# Patient Record
Sex: Female | Born: 1955
Health system: Southern US, Community
[De-identification: ages and names within clinical notes are randomized; demographics above are authoritative.]

## PROBLEM LIST (undated history)

## (undated) ENCOUNTER — Ambulatory Visit: Admission: EM

## (undated) DIAGNOSIS — I10 Essential (primary) hypertension: Secondary | ICD-10-CM

## (undated) DIAGNOSIS — E7800 Pure hypercholesterolemia, unspecified: Secondary | ICD-10-CM

## (undated) DIAGNOSIS — F419 Anxiety disorder, unspecified: Secondary | ICD-10-CM

## (undated) DIAGNOSIS — B009 Herpesviral infection, unspecified: Secondary | ICD-10-CM

## (undated) DIAGNOSIS — Z780 Asymptomatic menopausal state: Secondary | ICD-10-CM

## (undated) DIAGNOSIS — G43909 Migraine, unspecified, not intractable, without status migrainosus: Secondary | ICD-10-CM

## (undated) DIAGNOSIS — F32A Depression, unspecified: Secondary | ICD-10-CM

## (undated) HISTORY — DX: Anxiety disorder, unspecified: F41.9

## (undated) HISTORY — PX: OTHER SURGICAL HISTORY: SHX169

## (undated) HISTORY — DX: Asymptomatic menopausal state: Z78.0

## (undated) HISTORY — DX: Essential (primary) hypertension: I10

## (undated) HISTORY — PX: REPAIR ANKLE LIGAMENT: SUR1187

## (undated) HISTORY — DX: Herpesviral infection, unspecified: B00.9

## (undated) HISTORY — PX: FRACTURE SURGERY: SHX138

## (undated) HISTORY — DX: Pure hypercholesterolemia, unspecified: E78.00

## (undated) HISTORY — PX: APPENDECTOMY: SHX54

## (undated) HISTORY — DX: Migraine, unspecified, not intractable, without status migrainosus: G43.909

## (undated) HISTORY — DX: Depression, unspecified: F32.A

## (undated) HISTORY — PX: BREAST SURGERY: SHX581

## (undated) HISTORY — PX: DIAGNOSTIC LAPAROSCOPY: SUR761

---

## 2004-08-01 ENCOUNTER — Ambulatory Visit: Payer: Self-pay | Admitting: Podiatry

## 2005-08-12 ENCOUNTER — Ambulatory Visit: Payer: Self-pay | Admitting: Family Medicine

## 2007-03-17 ENCOUNTER — Ambulatory Visit: Payer: Self-pay | Admitting: Family Medicine

## 2007-10-06 HISTORY — PX: UPPER GI ENDOSCOPY: SHX6162

## 2008-06-22 ENCOUNTER — Ambulatory Visit: Payer: Self-pay | Admitting: Obstetrics and Gynecology

## 2008-06-25 ENCOUNTER — Ambulatory Visit: Payer: Self-pay | Admitting: Family Medicine

## 2008-07-11 ENCOUNTER — Ambulatory Visit: Payer: Self-pay | Admitting: Obstetrics and Gynecology

## 2008-07-11 ENCOUNTER — Encounter: Payer: Self-pay | Admitting: Gastroenterology

## 2008-07-12 ENCOUNTER — Encounter: Payer: Self-pay | Admitting: Gastroenterology

## 2008-07-12 ENCOUNTER — Ambulatory Visit: Payer: Self-pay | Admitting: Obstetrics and Gynecology

## 2008-07-18 ENCOUNTER — Ambulatory Visit: Payer: Self-pay | Admitting: Obstetrics and Gynecology

## 2008-07-23 ENCOUNTER — Ambulatory Visit: Payer: Self-pay | Admitting: Obstetrics and Gynecology

## 2008-08-07 ENCOUNTER — Ambulatory Visit: Payer: Self-pay | Admitting: Obstetrics and Gynecology

## 2008-08-09 ENCOUNTER — Encounter: Payer: Self-pay | Admitting: Gastroenterology

## 2008-08-17 ENCOUNTER — Encounter: Payer: Self-pay | Admitting: Gastroenterology

## 2008-08-17 DIAGNOSIS — R933 Abnormal findings on diagnostic imaging of other parts of digestive tract: Secondary | ICD-10-CM | POA: Insufficient documentation

## 2008-09-03 ENCOUNTER — Ambulatory Visit: Payer: Self-pay | Admitting: Unknown Physician Specialty

## 2008-09-03 HISTORY — PX: COLONOSCOPY: SHX174

## 2008-09-03 LAB — HM COLONOSCOPY

## 2008-09-06 ENCOUNTER — Ambulatory Visit (HOSPITAL_COMMUNITY): Admission: RE | Admit: 2008-09-06 | Discharge: 2008-09-06 | Payer: Self-pay | Admitting: Gastroenterology

## 2008-09-06 ENCOUNTER — Telehealth: Payer: Self-pay | Admitting: Gastroenterology

## 2008-09-06 ENCOUNTER — Ambulatory Visit: Payer: Self-pay | Admitting: Gastroenterology

## 2009-02-14 ENCOUNTER — Ambulatory Visit: Payer: Self-pay | Admitting: Family Medicine

## 2009-08-21 ENCOUNTER — Ambulatory Visit: Payer: Self-pay

## 2009-09-18 ENCOUNTER — Emergency Department: Payer: Self-pay | Admitting: Unknown Physician Specialty

## 2009-09-19 ENCOUNTER — Ambulatory Visit: Payer: Self-pay | Admitting: Cardiology

## 2009-09-20 ENCOUNTER — Ambulatory Visit: Payer: Self-pay | Admitting: Cardiology

## 2010-03-31 IMAGING — US US PELV - US TRANSVAGINAL
1 series · 17 of 25 positions shown · non-contrast
Comparison: none

REASON FOR EXAM: Right lower pelvic pain
COMMENTS:

[Series 1: us pelv - us transvaginal · 17 of 45 slices shown]
[im 1/45]
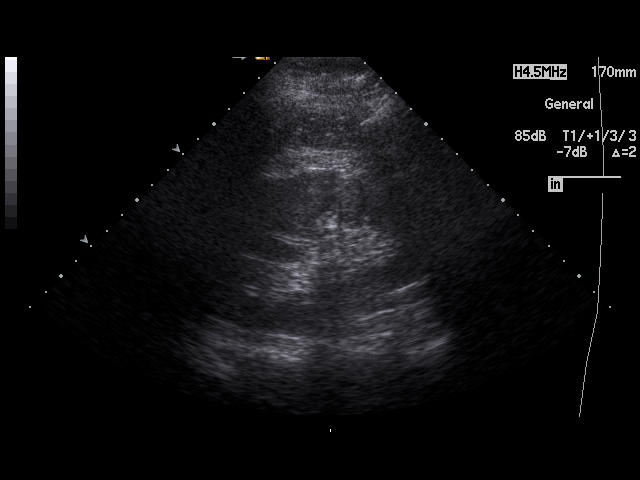
[im 4/45]
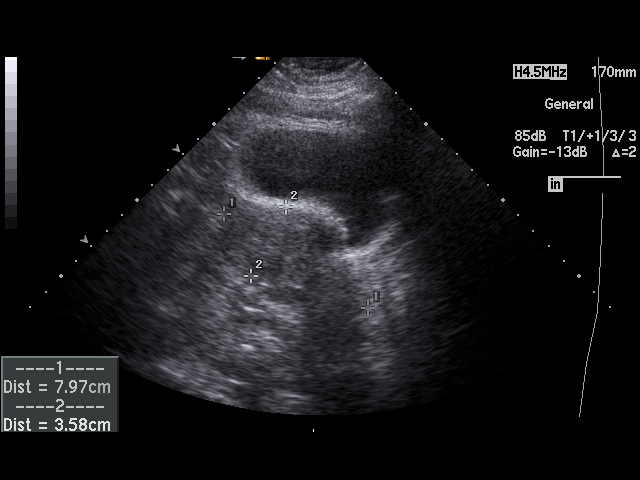
[im 6/45]
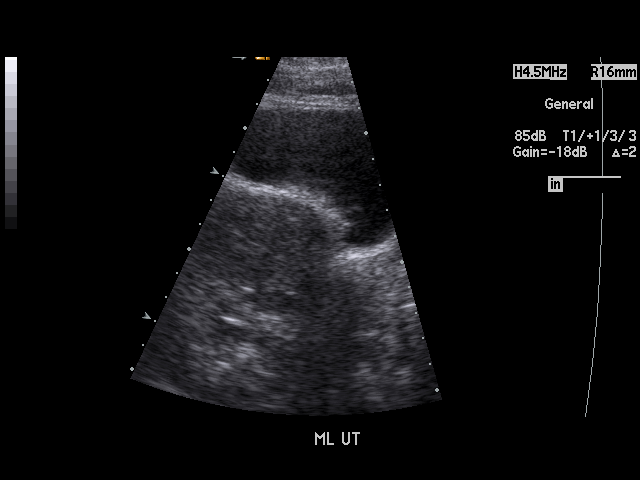
[im 10/45]
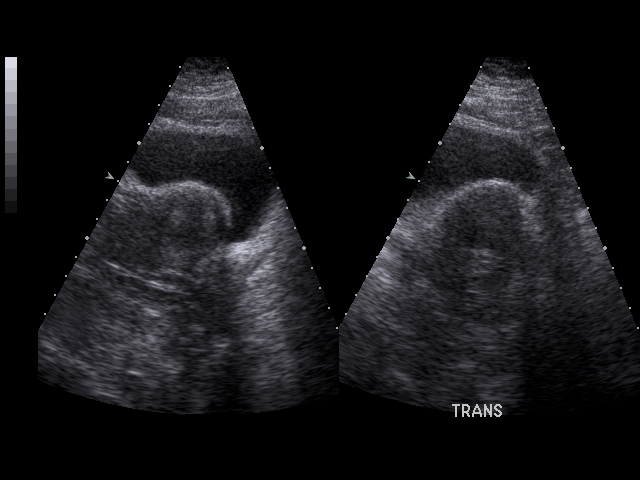
[im 12/45]
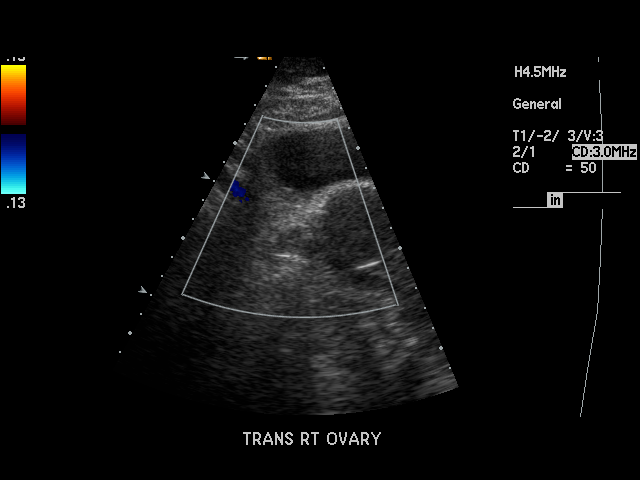
[im 15/45]
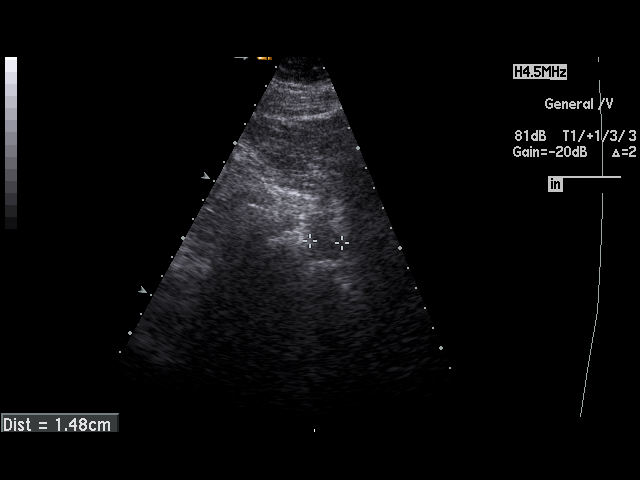
[im 17/45]
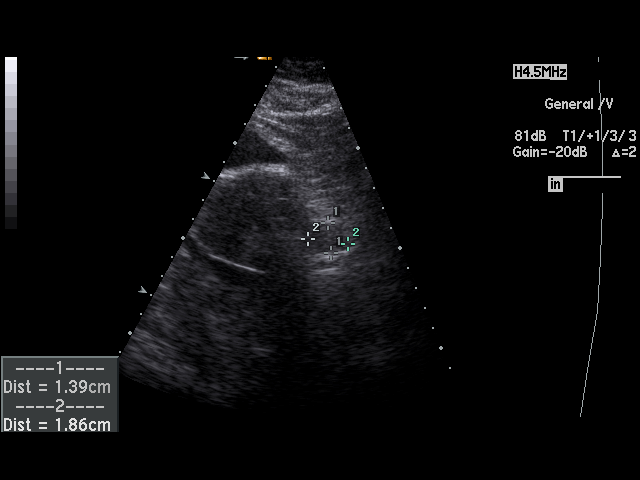
[im 21/45]
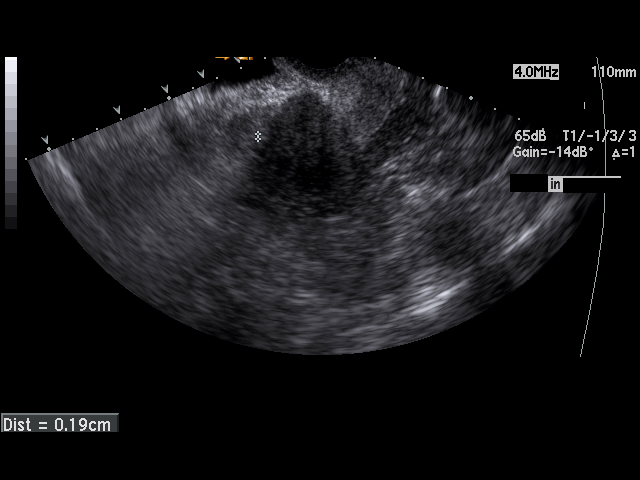
[im 23/45]
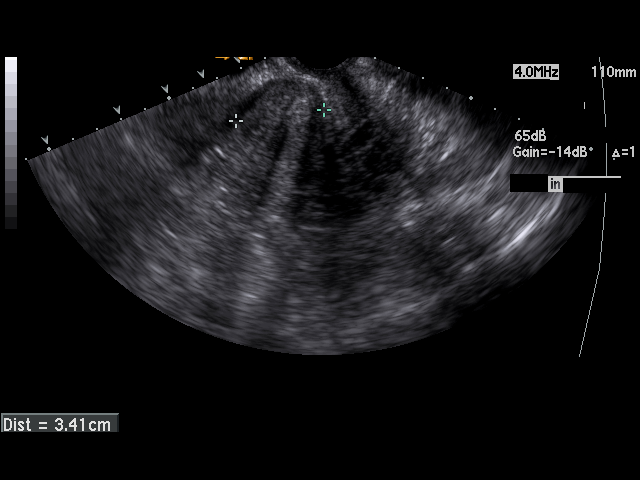
[im 24/45]
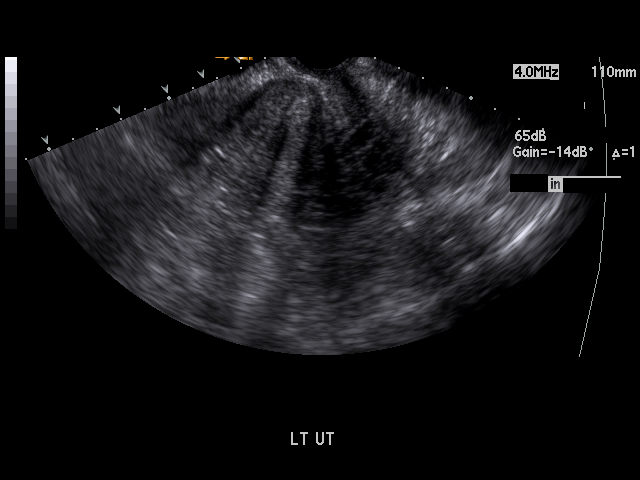
[im 28/45]
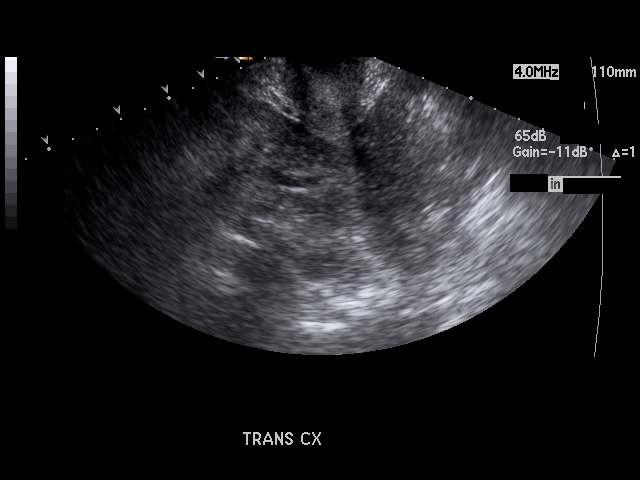
[im 30/45]
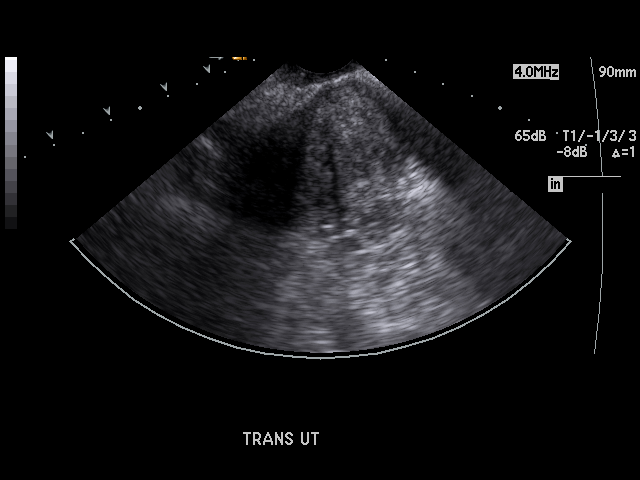
[im 34/45]
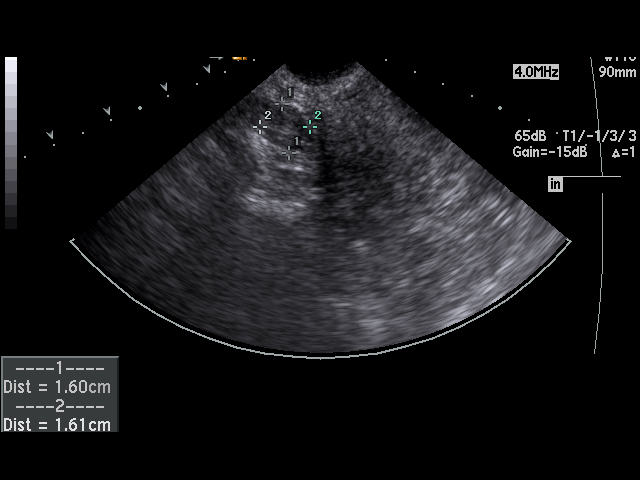
[im 35/45]
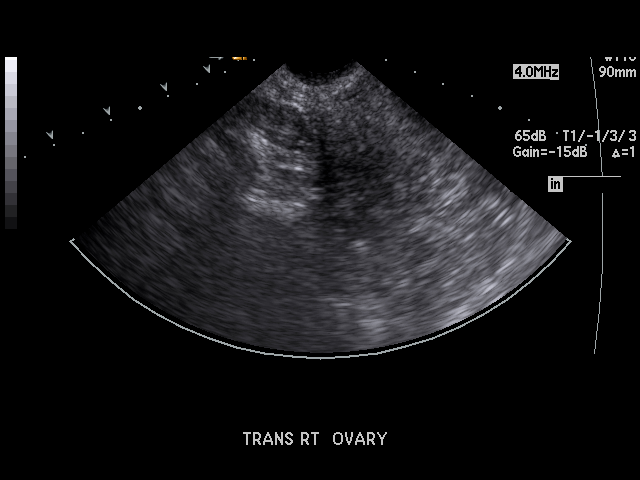
[im 39/45]
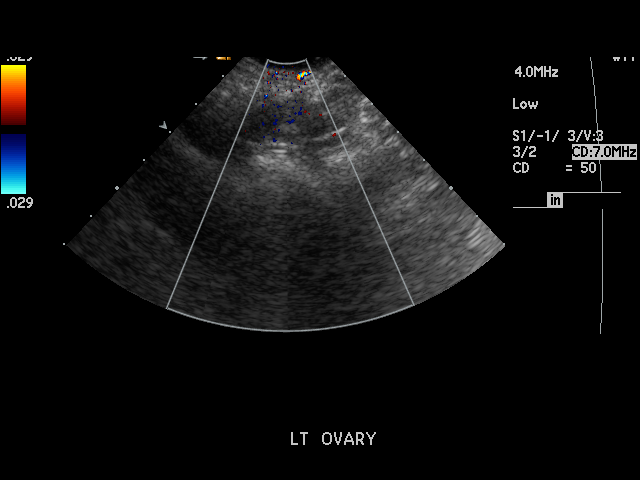
[im 41/45]
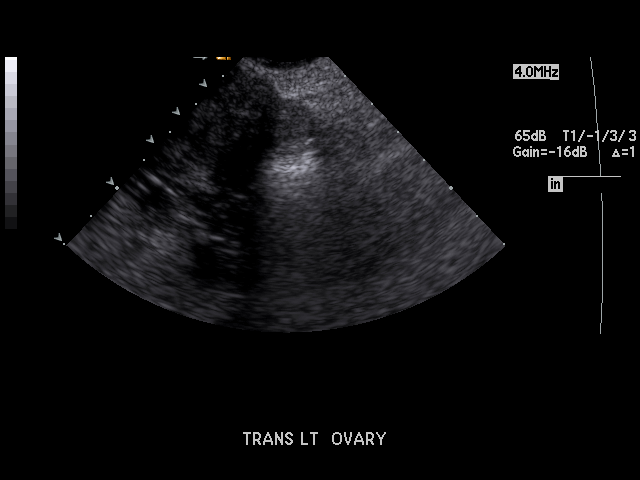
[im 45/45]
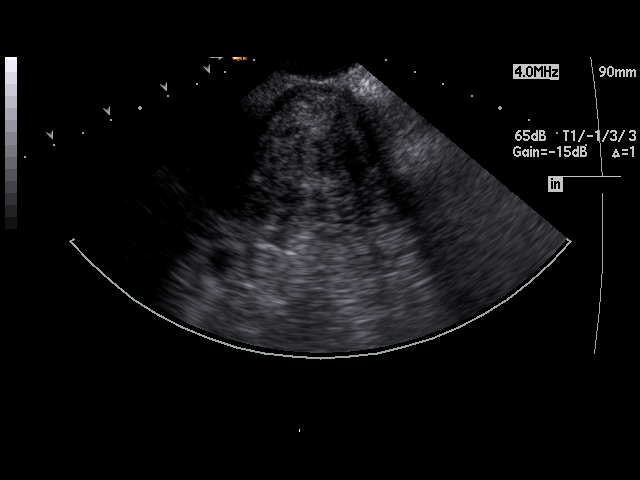

[17 of 25 positions shown; findings below may reference images not displayed]

PROCEDURE:     US  - US PELVIS MASS EXAM W/TRANSVAGI  - June 22, 2008  [DATE]

RESULT:     Transabdominal and endovaginal scanning was performed.

The uterus measures 7.97 cm longitudinally x 3.58 cm in AP diameter. The
endometrium measures 1.9 mm in thickness. There is a hypoechoic mass
associated with the anterior uterine corpus. The finding is compatible with
a uterine fibroid and measures 3.63 cm at maximum diameter. The RIGHT and
LEFT ovaries are visualized. The RIGHT ovary measures 1.68 cm at maximum
diameter. The LEFT ovary measures 1.64 cm at maximum diameter. No abnormal
adnexal masses are seen. There is no free fluid observed in the pelvis. The
kidneys show no hydronephrosis. The visualized portion of the urinary
bladder is normal in appearance.
IMPRESSION: There is a 3.6 cm uterine mass consistent with a uterine
fibroid as noted above.

## 2010-05-16 IMAGING — CR DG CHEST 2V
1 series · 2 of 2 positions shown · non-contrast
Comparison: none

REASON FOR EXAM: pt is having laparoscopy 2wks ago , sob gas production
COMMENTS:

[Series 1: view not recorded · 0.17mm/px · 2 of 2 slices shown]
[im 1/2]
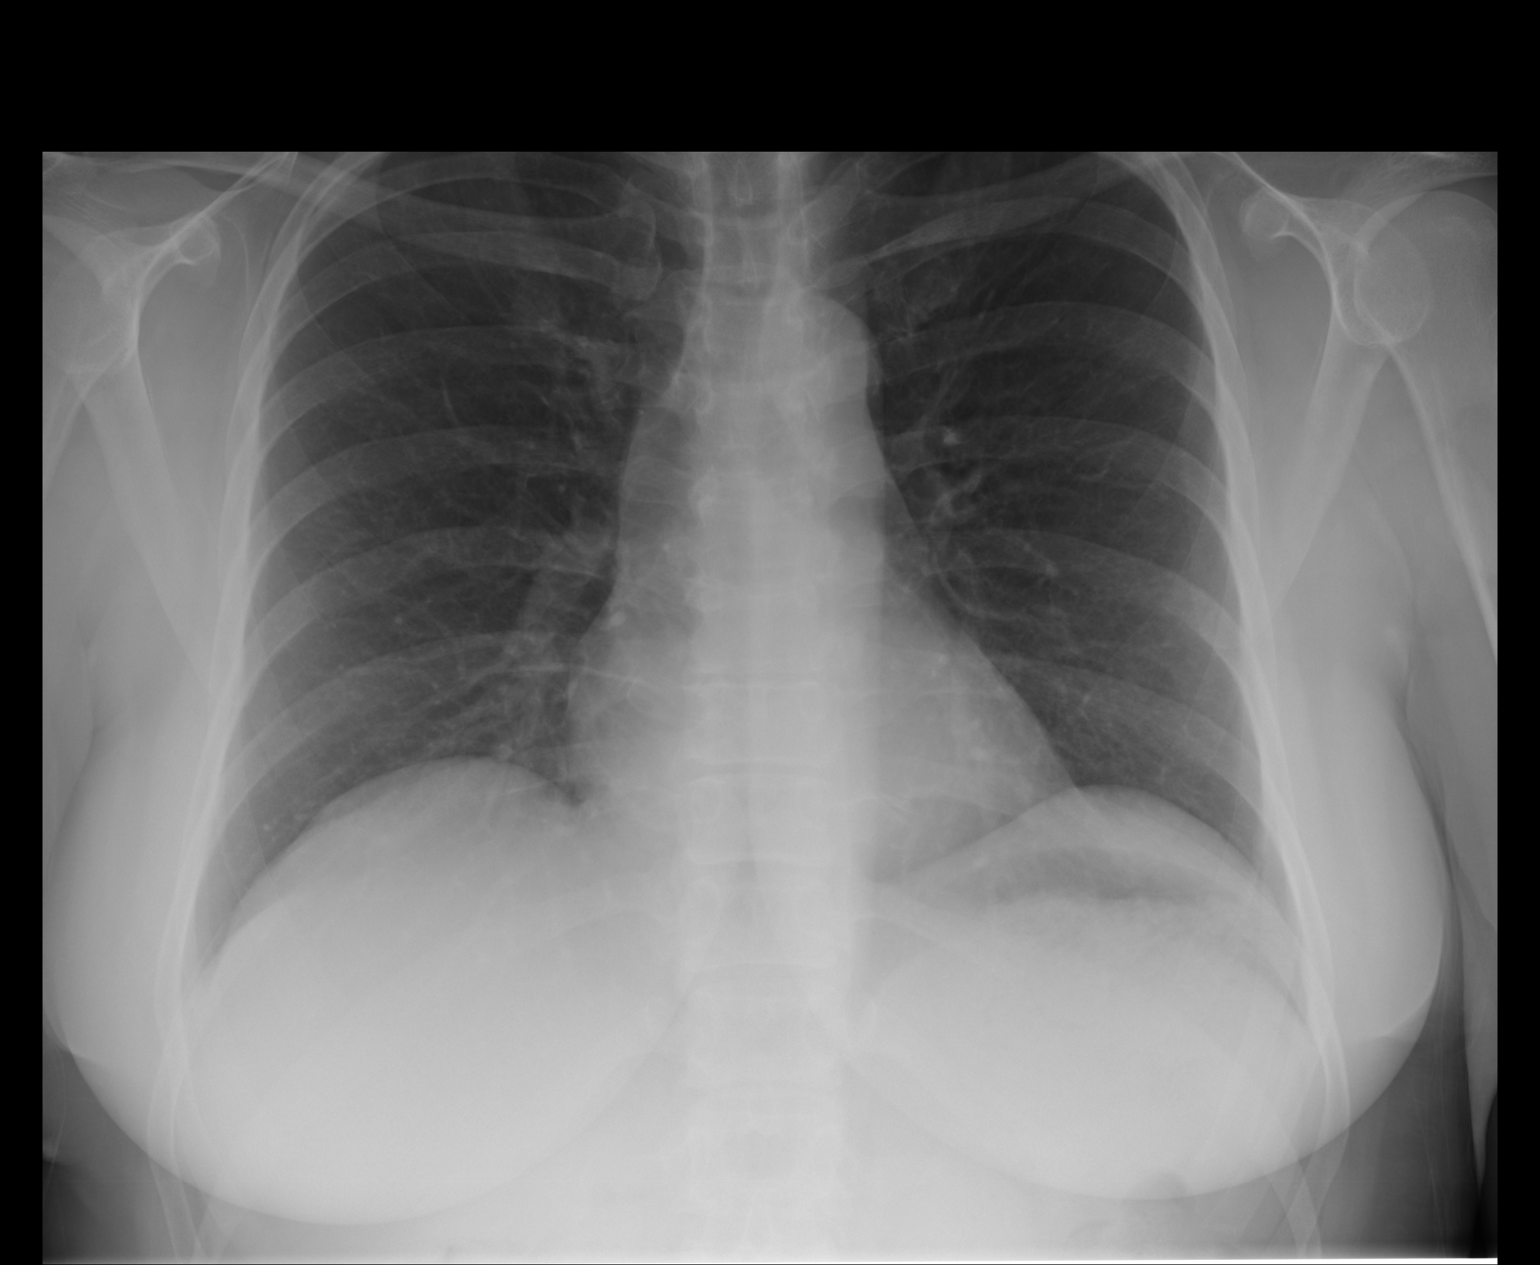
[im 2/2]
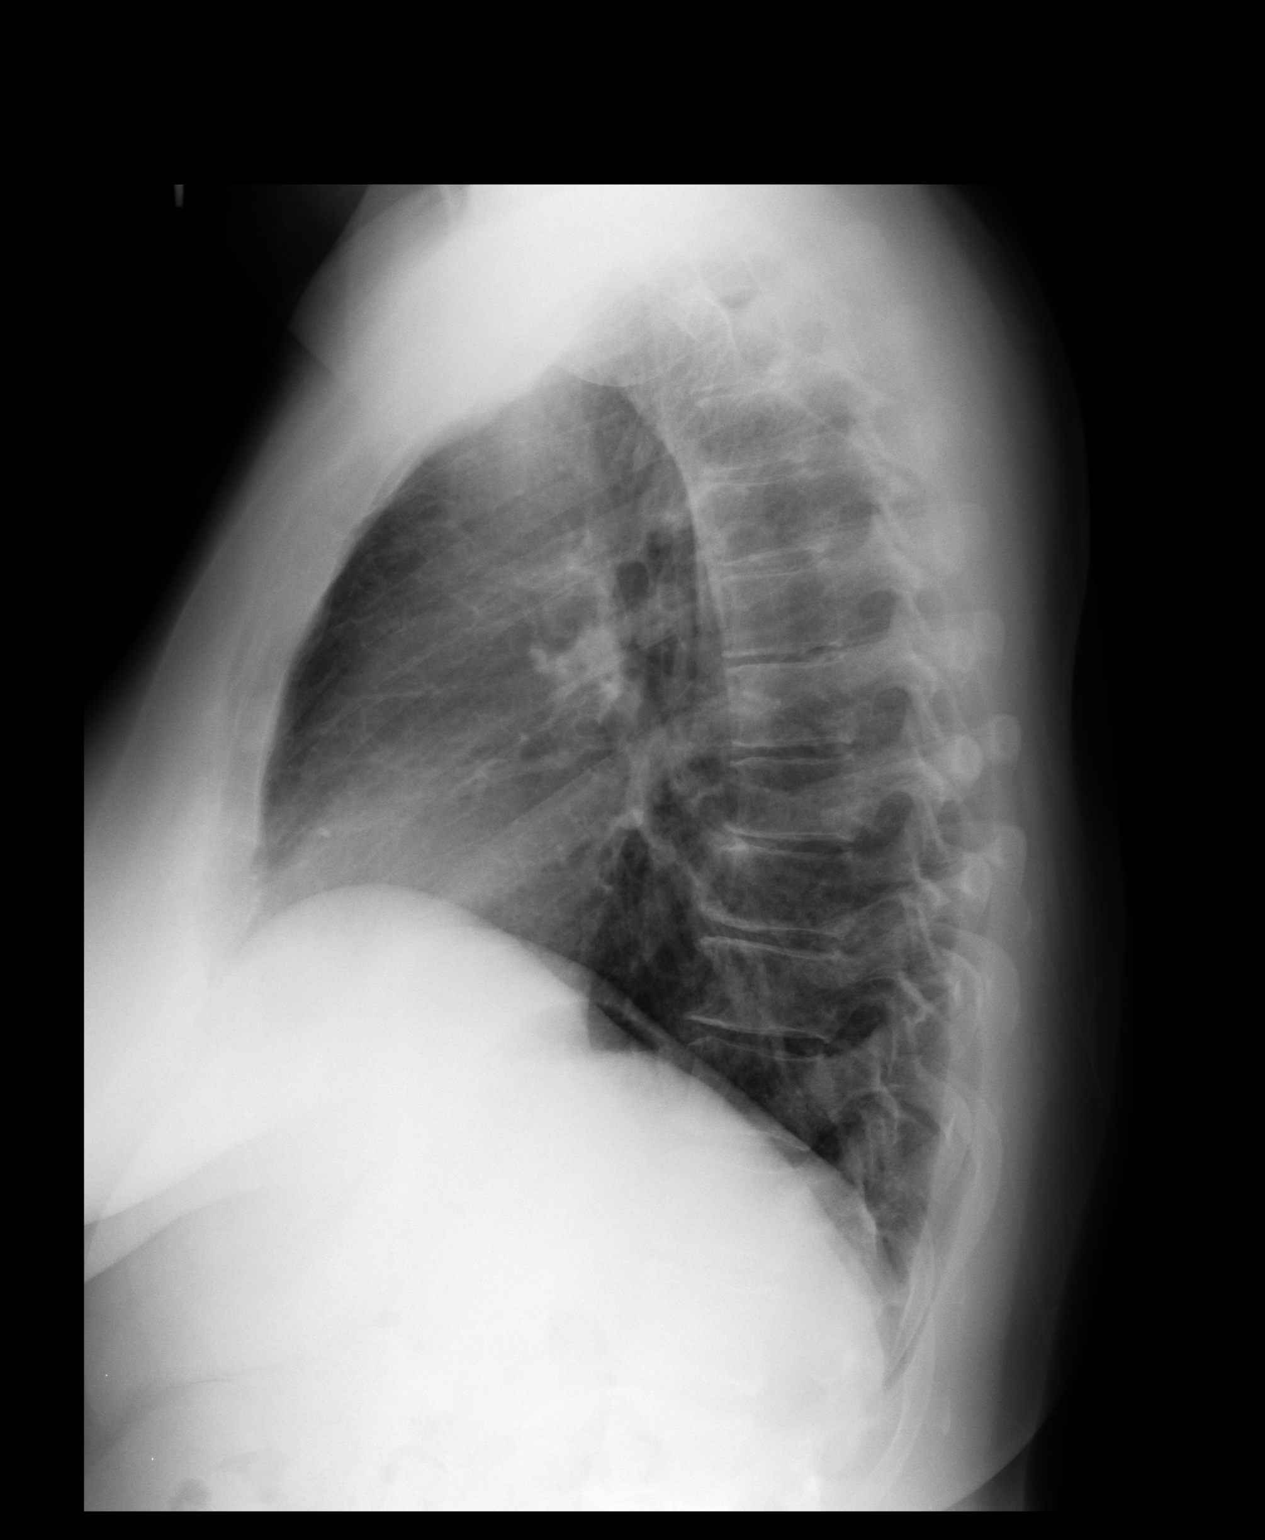

[2 of 2 positions shown; findings below may reference images not displayed]

PROCEDURE:     DXR - DXR CHEST PA (OR AP) AND LATERAL  - August 07, 2008  [DATE]

RESULT:     The lungs are adequately inflated. There is no focal infiltrate.
The heart is normal in size. The pulmonary vascularity is not engorged.
There is no evidence of a pleural effusion. The bony thorax is within the
limits of normal.
IMPRESSION: I do not see evidence of acute cardiopulmonary abnormality.

## 2010-05-16 IMAGING — CR DG ABDOMEN 1V
1 series · 1 of 1 positions shown · non-contrast
Comparison: none

REASON FOR EXAM: pt is s/p laparoscopy 2 wks ago having sob gas production
COMMENTS:

PROCEDURE:     DXR - DXR KIDNEY URETER BLADDER  - August 07, 2008  [DATE]
RESULT:     The bowel gas pattern is consistent with constipation. The bony
structures are normal. I see no abnormal soft tissue calcifications. No
abnormal gas collections are identified.

[view not recorded]
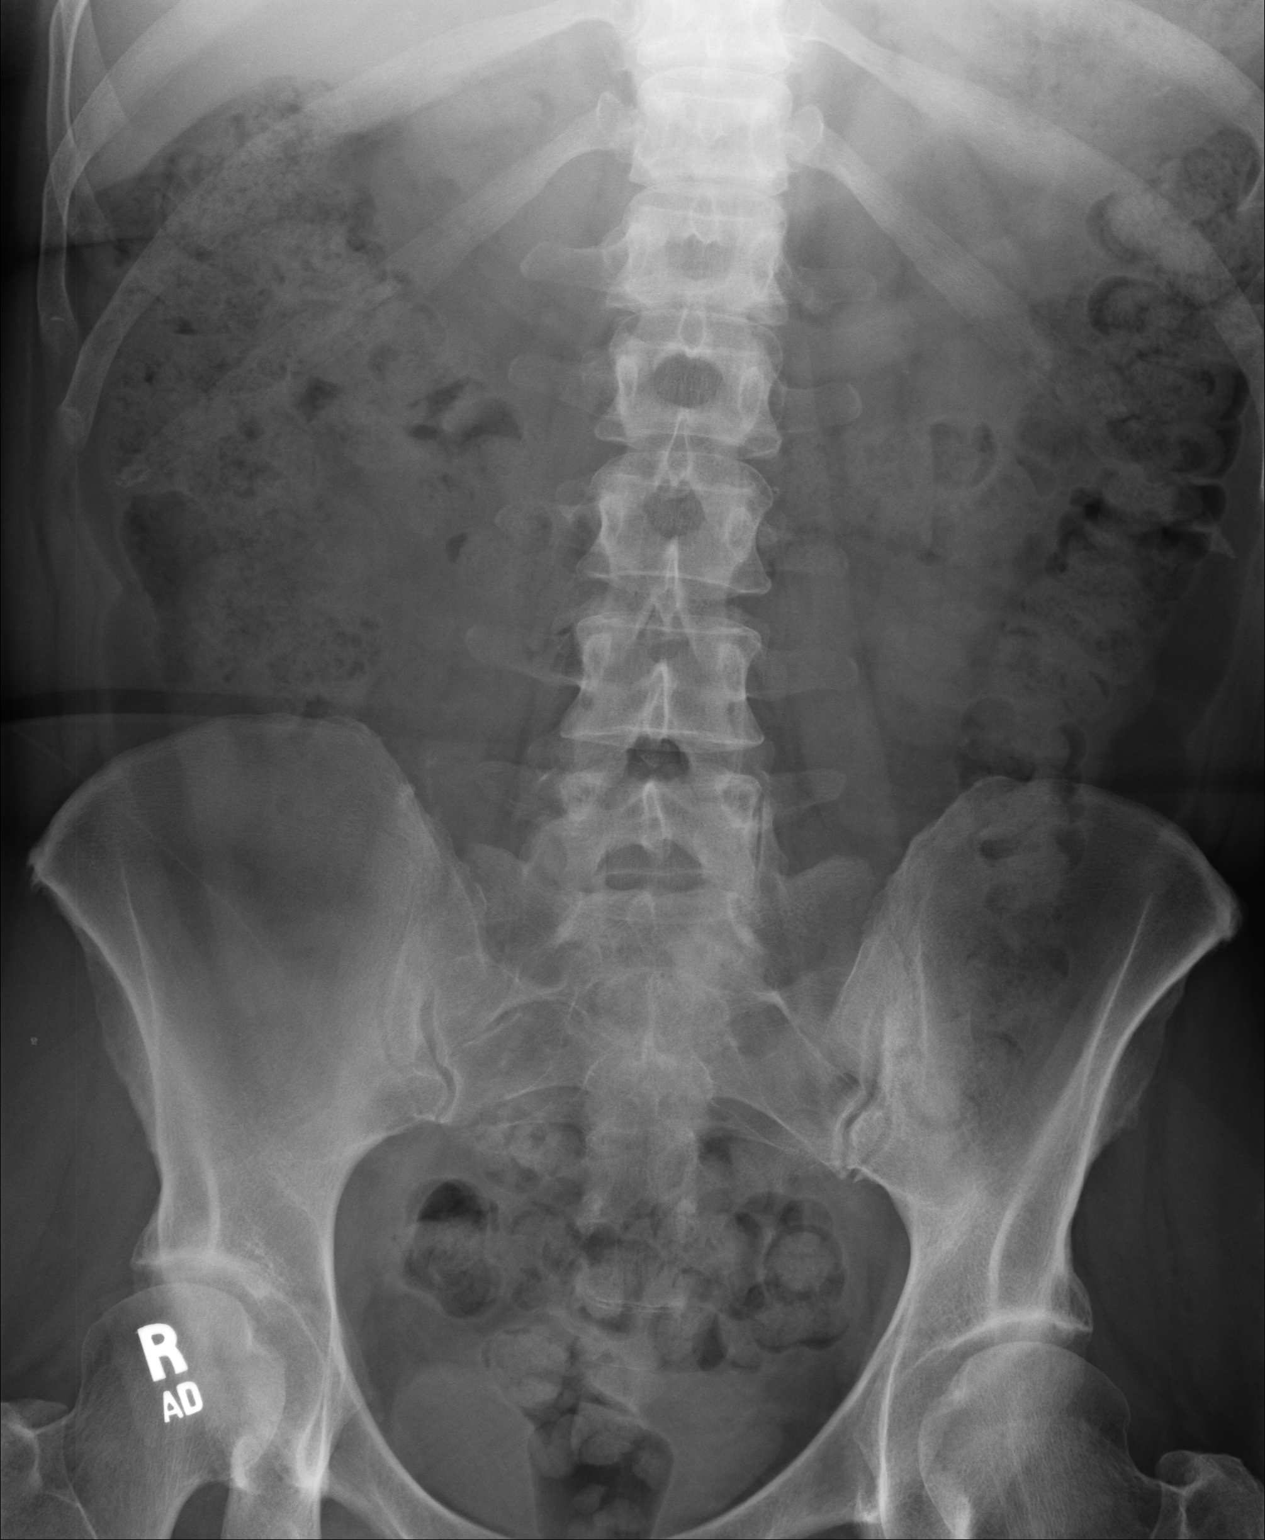

[1 of 1 positions shown; findings below may reference images not displayed]

IMPRESSION: The bowel gas pattern is consistent with constipation.

## 2010-08-20 ENCOUNTER — Ambulatory Visit: Payer: Self-pay | Admitting: General Practice

## 2010-09-09 ENCOUNTER — Ambulatory Visit: Payer: Self-pay

## 2011-04-01 ENCOUNTER — Ambulatory Visit: Payer: Self-pay | Admitting: Family Medicine

## 2011-06-10 ENCOUNTER — Ambulatory Visit: Payer: Self-pay | Admitting: Family Medicine

## 2011-08-03 ENCOUNTER — Other Ambulatory Visit: Payer: Self-pay | Admitting: Physician Assistant

## 2011-11-12 ENCOUNTER — Ambulatory Visit: Payer: Self-pay | Admitting: Family Medicine

## 2012-03-16 ENCOUNTER — Encounter: Payer: Self-pay | Admitting: Sports Medicine

## 2012-04-04 ENCOUNTER — Encounter: Payer: Self-pay | Admitting: Sports Medicine

## 2012-05-03 LAB — HM PAP SMEAR

## 2012-05-28 IMAGING — CR DG FOOT COMPLETE 3+V*L*
1 series · 3 of 3 positions shown · non-contrast
Comparison: none

REASON FOR EXAM: fall injury pain  fax result to [phone]
COMMENTS:

PROCEDURE:     DXR - DXR FOOT LT COMP W/OBLIQUES  - August 20, 2010  [DATE]
RESULT:
There is no evidence of fracture, dislocation or malalignment.
Osteoarthritic changes are appreciated involving the proximal and distal
interphalangeal joints.

[Series 1: view not recorded · 0.17mm/px · 3 of 3 slices shown]
[im 1/3]
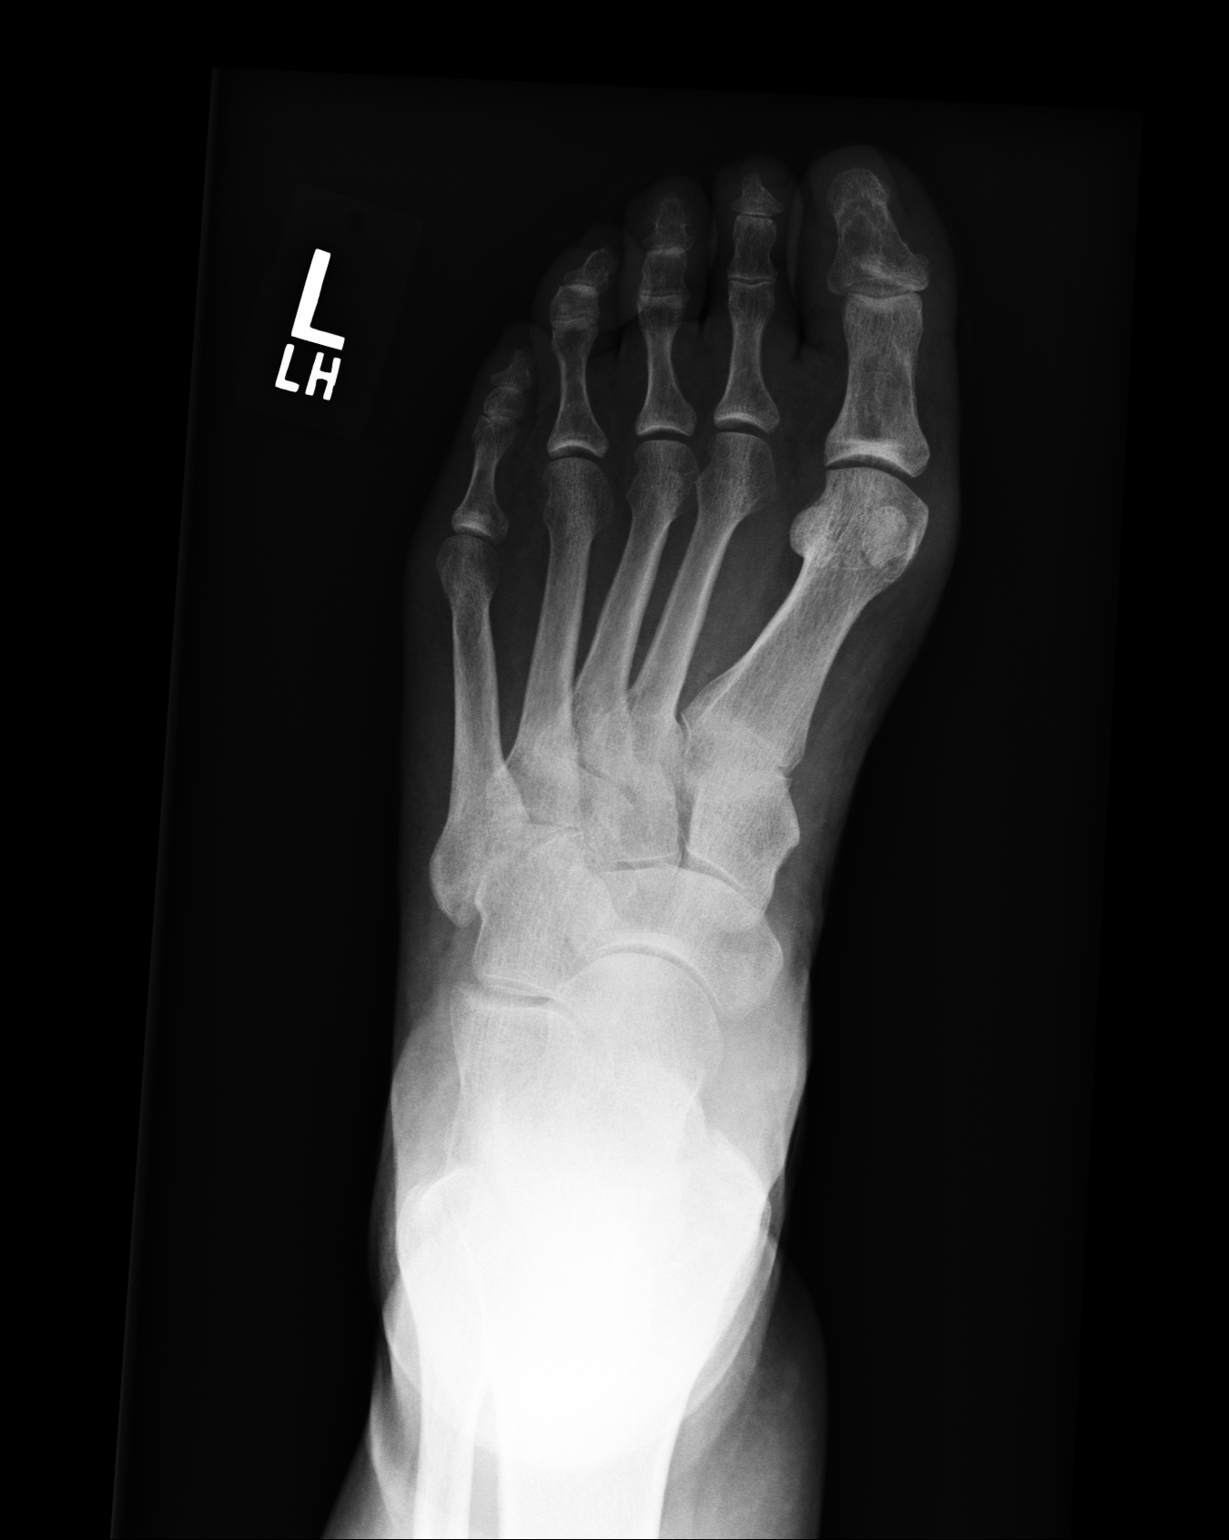
[im 2/3]
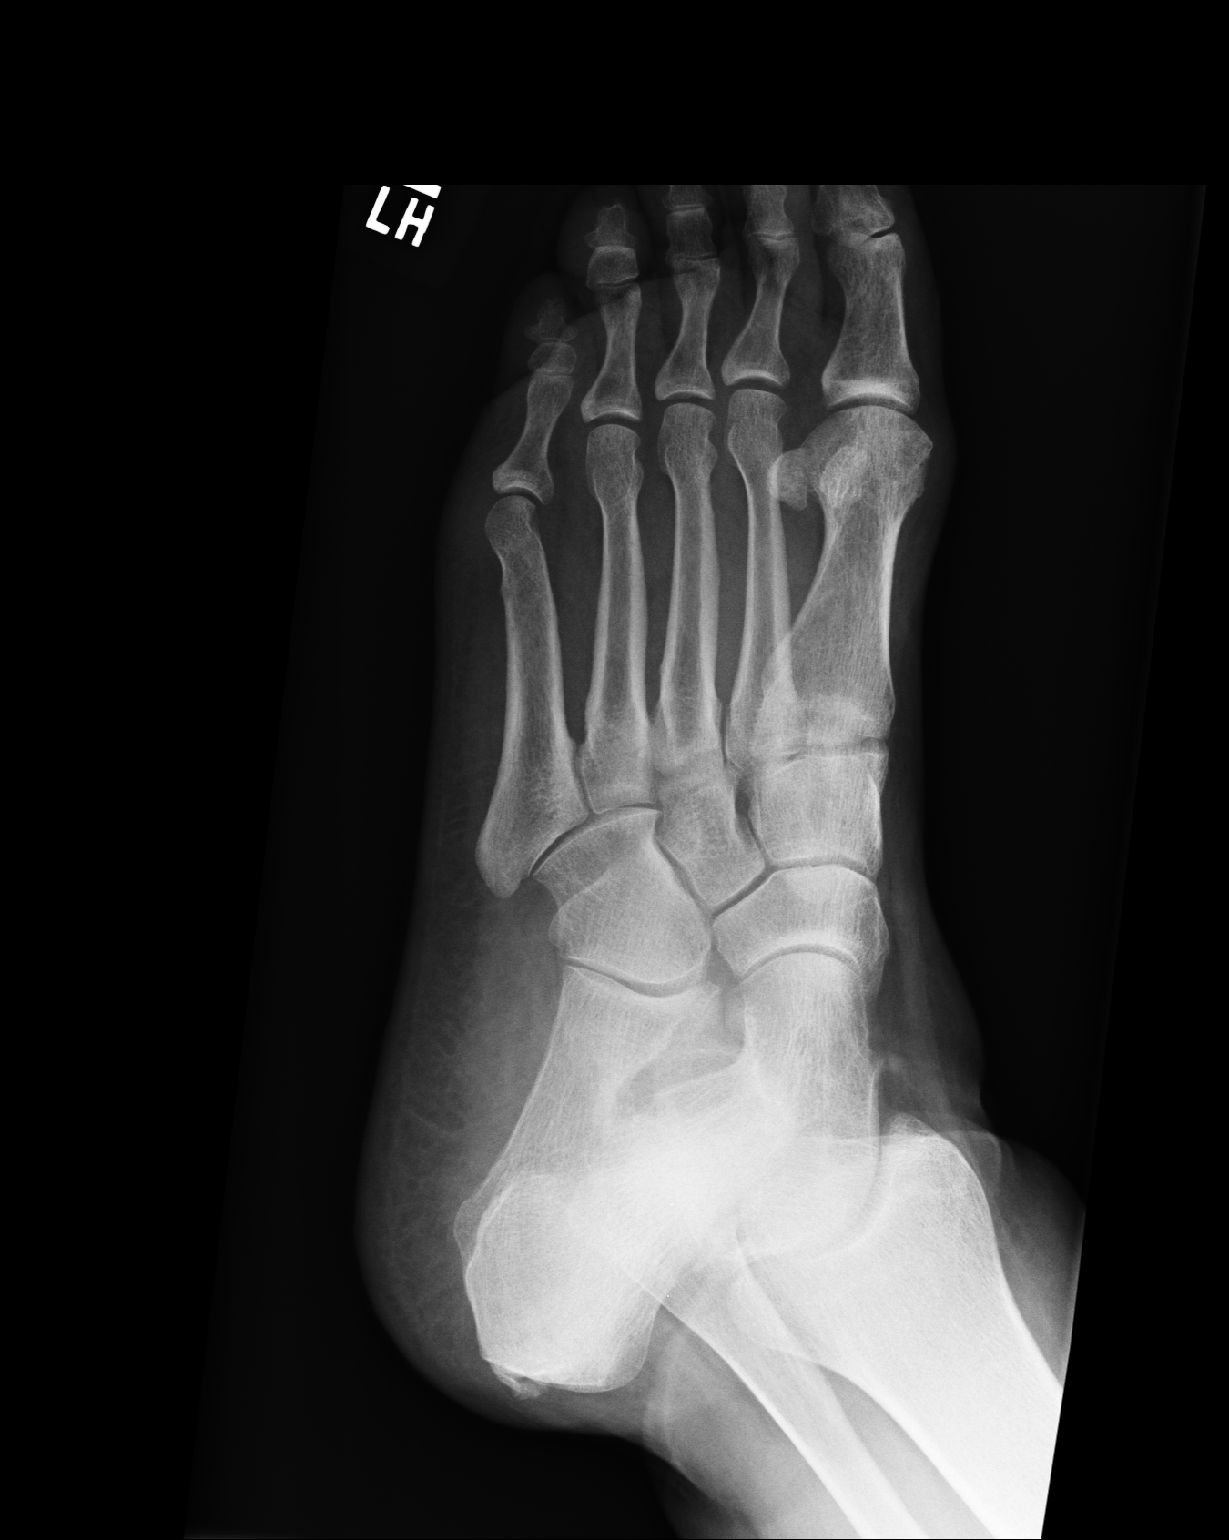
[im 3/3]
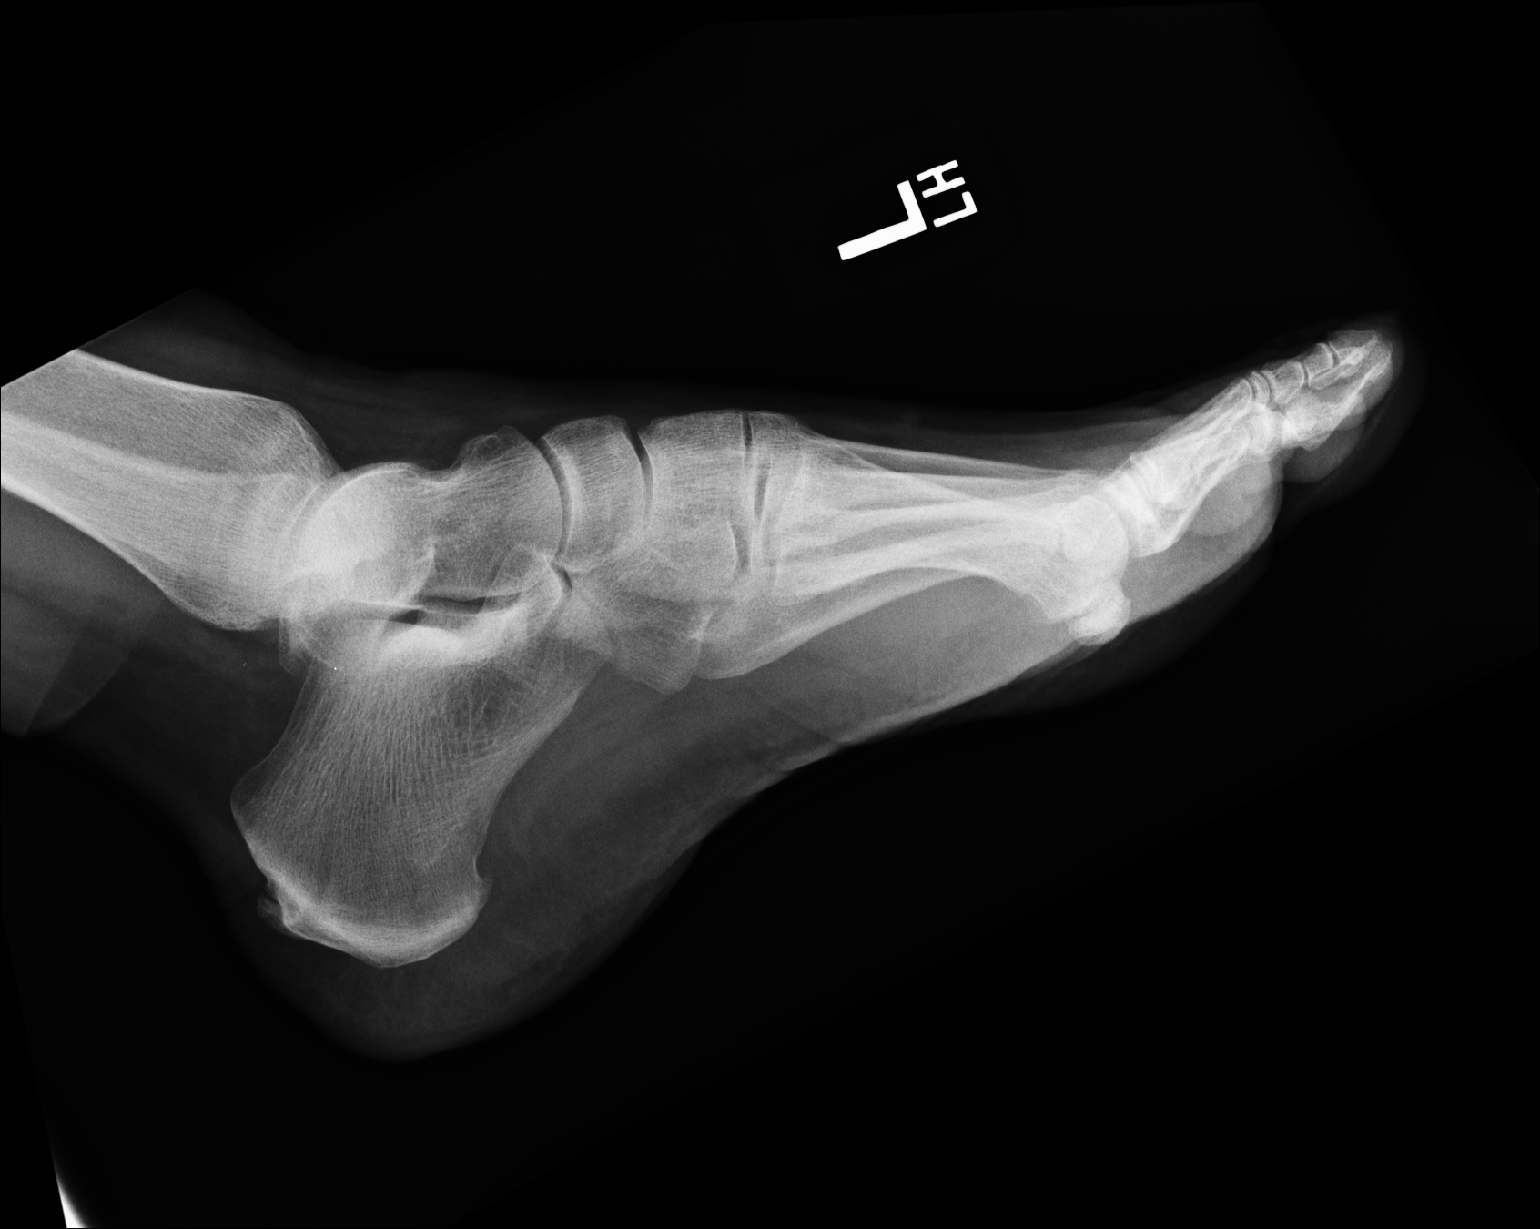

[3 of 3 positions shown; findings below may reference images not displayed]

IMPRESSION: 1.     Osteoarthritic changes without evidence of acute osseous
abnormalities.
2.     If there is persistent clinical concern, repeat evaluation in 7-10
days is recommended.

## 2013-04-27 ENCOUNTER — Ambulatory Visit: Payer: Self-pay | Admitting: Family Medicine

## 2014-07-26 ENCOUNTER — Ambulatory Visit: Payer: Self-pay | Admitting: Family Medicine

## 2014-07-26 LAB — HM MAMMOGRAPHY

## 2014-07-31 ENCOUNTER — Ambulatory Visit: Payer: Self-pay

## 2015-01-15 ENCOUNTER — Encounter: Payer: Self-pay | Admitting: *Deleted

## 2015-01-24 ENCOUNTER — Ambulatory Visit
Admit: 2015-01-24 | Disposition: A | Discharge status: Home / Self Care | Payer: Self-pay | Attending: Unknown Physician Specialty | Admitting: Unknown Physician Specialty

## 2015-08-30 DIAGNOSIS — E7800 Pure hypercholesterolemia, unspecified: Secondary | ICD-10-CM | POA: Insufficient documentation

## 2015-08-30 DIAGNOSIS — G43909 Migraine, unspecified, not intractable, without status migrainosus: Secondary | ICD-10-CM | POA: Insufficient documentation

## 2015-08-30 DIAGNOSIS — B009 Herpesviral infection, unspecified: Secondary | ICD-10-CM | POA: Insufficient documentation

## 2015-08-30 DIAGNOSIS — Z780 Asymptomatic menopausal state: Secondary | ICD-10-CM | POA: Insufficient documentation

## 2015-09-09 ENCOUNTER — Ambulatory Visit (INDEPENDENT_AMBULATORY_CARE_PROVIDER_SITE_OTHER): Payer: 59 | Admitting: Unknown Physician Specialty

## 2015-09-09 ENCOUNTER — Encounter: Payer: Self-pay | Admitting: Unknown Physician Specialty

## 2015-09-09 VITALS — BP 116/74 | HR 76 | Temp 98.0°F | Ht 68.1 in | Wt 192.8 lb

## 2015-09-09 DIAGNOSIS — G43909 Migraine, unspecified, not intractable, without status migrainosus: Secondary | ICD-10-CM

## 2015-09-09 DIAGNOSIS — Z0000 Encounter for general adult medical examination without abnormal findings: Secondary | ICD-10-CM

## 2015-09-09 MED ORDER — ACYCLOVIR 400 MG PO TABS: 400.0000 mg | ORAL_TABLET | Freq: Three times a day (TID) | ORAL | Status: DC

## 2015-09-09 NOTE — Progress Notes (Signed)
BP 116/74 mmHg  Pulse 76  Temp(Src) 98 F (36.7 C)  Ht 5' 8.1" (1.73 m)  Wt 192 lb 12.8 oz (87.454 kg)  BMI 29.22 kg/m2  SpO2 98%  LMP  (LMP Unknown)   Subjective:    Patient ID: Debra Cannon, female    DOB: 10/23/1955, 59 y.o.   MRN: [id number]  HPI: Debra Cannon is a 59 y.o. female  Chief Complaint  Patient presents with  . Annual Exam  . Medication Refill    pt states she needs refill on Acyclovir. states she would like it to be PRN    Relevant past medical, surgical, family and social history reviewed and updated as indicated. Interim medical history since our last visit reviewed. Allergies and medications reviewed and updated.  Review of Systems  Constitutional: Negative.   HENT: Negative.   Eyes: Negative.   Respiratory: Negative.   Cardiovascular: Negative.   Gastrointestinal: Negative.   Endocrine: Negative.   Genitourinary: Negative.   Musculoskeletal: Negative.   Skin: Negative.   Allergic/Immunologic: Negative.   Neurological: Negative.        Migraines.  Usually helped with she can catch it on time.  Alternates with Zomig.    Hematological: Negative.   Psychiatric/Behavioral: Negative.    Per HPI unless specifically indicated above    Objective:    BP 116/74 mmHg  Pulse 76  Temp(Src) 98 F (36.7 C)  Ht 5' 8.1" (1.73 m)  Wt 192 lb 12.8 oz (87.454 kg)  BMI 29.22 kg/m2  SpO2 98%  LMP  (LMP Unknown)  Wt Readings from Last 3 Encounters:  09/09/15 192 lb 12.8 oz (87.454 kg)  08/27/14 188 lb (85.276 kg)    Physical Exam  Constitutional: She is oriented to person, place, and time. She appears well-developed and well-nourished.  HENT:  Head: Normocephalic and atraumatic.  Eyes: Pupils are equal, round, and reactive to light. Right eye exhibits no discharge. Left eye exhibits no discharge. No scleral icterus.  Neck: Normal range of motion. Neck supple. Carotid bruit is not present. No thyromegaly present.  Cardiovascular: Normal rate, regular  rhythm and normal heart sounds.  Exam reveals no gallop and no friction rub.   No murmur heard. Pulmonary/Chest: Effort normal and breath sounds normal. No respiratory distress. She has no wheezes. She has no rales.  Abdominal: Soft. Bowel sounds are normal. There is no tenderness. There is no rebound.  Genitourinary: No breast swelling, tenderness or discharge.  Musculoskeletal: Normal range of motion.  Lymphadenopathy:    She has no cervical adenopathy.  Neurological: She is alert and oriented to person, place, and time.  Skin: Skin is warm, dry and intact. No rash noted.  Psychiatric: She has a normal mood and affect. Her speech is normal and behavior is normal. Judgment and thought content normal. Cognition and memory are normal.       Assessment & Plan:   Problem List Items Addressed This Visit      Unprioritized   Migraine    Other Visit Diagnoses    Routine general medical examination at a health care facility    -  Primary    Relevant Orders    CBC with Differential/Platelet    Comprehensive metabolic panel    IGP, Aptima HPV, rfx 16/18,45    Lipid Panel w/o Chol/HDL Ratio    TSH    MM DIGITAL SCREENING BILATERAL        Follow up plan: Return if symptoms worsen or fail  to improve.

## 2015-09-10 LAB — CBC WITH DIFFERENTIAL/PLATELET
BASOS ABS: 0 10*3/uL (ref 0.0–0.2)
BASOS: 0 %
EOS (ABSOLUTE): 0.4 10*3/uL (ref 0.0–0.4)
Eos: 4 %
Hematocrit: 40 % (ref 34.0–46.6)
Hemoglobin: 13.7 g/dL (ref 11.1–15.9)
Immature Grans (Abs): 0 10*3/uL (ref 0.0–0.1)
Immature Granulocytes: 1 %
LYMPHS: 31 %
Lymphocytes Absolute: 2.7 10*3/uL (ref 0.7–3.1)
MCH: 30.9 pg (ref 26.6–33.0)
MCHC: 34.3 g/dL (ref 31.5–35.7)
MCV: 90 fL (ref 79–97)
MONOS ABS: 0.6 10*3/uL (ref 0.1–0.9)
Monocytes: 7 %
NEUTROS ABS: 5.1 10*3/uL (ref 1.4–7.0)
Neutrophils: 57 %
PLATELETS: 366 10*3/uL (ref 150–379)
RBC: 4.44 x10E6/uL (ref 3.77–5.28)
RDW: 12.8 % (ref 12.3–15.4)
WBC: 8.9 10*3/uL (ref 3.4–10.8)

## 2015-09-10 LAB — LIPID PANEL W/O CHOL/HDL RATIO
Cholesterol, Total: 226 mg/dL — ABNORMAL HIGH (ref 100–199)
HDL: 31 mg/dL — ABNORMAL LOW (ref >39)
LDL CALC: 117 mg/dL — AB (ref 0–99)
Triglycerides: 391 mg/dL — ABNORMAL HIGH (ref 0–149)
VLDL CHOLESTEROL CAL: 78 mg/dL — AB (ref 5–40)

## 2015-09-10 LAB — COMPREHENSIVE METABOLIC PANEL
Albumin/Globulin Ratio: 2.1 (ref 1.1–2.5)
Globulin, Total: 2.1 g/dL (ref 1.5–4.5)
TOTAL PROTEIN: 6.6 g/dL (ref 6.0–8.5)

## 2015-09-10 LAB — TSH: TSH: 2.27 u[IU]/mL (ref 0.450–4.500)

## 2015-09-12 LAB — IGP, APTIMA HPV, RFX 16/18,45: PAP Smear Comment: 0

## 2015-09-27 ENCOUNTER — Encounter: Payer: Self-pay | Admitting: Physician Assistant

## 2015-09-27 ENCOUNTER — Ambulatory Visit: Payer: Self-pay | Admitting: Physician Assistant

## 2015-09-27 VITALS — BP 120/80 | HR 76 | Temp 97.7°F

## 2015-09-27 DIAGNOSIS — M79605 Pain in left leg: Secondary | ICD-10-CM

## 2015-09-27 MED ORDER — ETODOLAC ER 500 MG PO TB24: 500.0000 mg | ORAL_TABLET | Freq: Every day | ORAL | Status: DC

## 2015-09-27 NOTE — Progress Notes (Signed)
S: c/o left lower leg pain, felt a snap about a week ago, at first thought she had strained it but the pain keeps getting worse, no redness or warmth, no cp/sob  O: vitals wnl, nad, left lower leg swollen, bruising anteriorly, able to point toe but it reproduces pain, n/v intact  A: acute lower leg pain, ?muscle tear  P: f/u with ortho, use crutches, ice elevated, lodine rx

## 2015-10-17 ENCOUNTER — Other Ambulatory Visit: Payer: Self-pay | Admitting: Unknown Physician Specialty

## 2015-10-31 DIAGNOSIS — S86012D Strain of left Achilles tendon, subsequent encounter: Secondary | ICD-10-CM | POA: Diagnosis not present

## 2016-04-01 ENCOUNTER — Ambulatory Visit
Admission: RE | Admit: 2016-04-01 | Discharge: 2016-04-01 | Disposition: A | Discharge status: Home / Self Care | Payer: 59 | Source: Ambulatory Visit | Attending: Unknown Physician Specialty | Admitting: Unknown Physician Specialty

## 2016-04-01 ENCOUNTER — Other Ambulatory Visit: Payer: Self-pay | Admitting: Unknown Physician Specialty

## 2016-04-01 DIAGNOSIS — Z1231 Encounter for screening mammogram for malignant neoplasm of breast: Secondary | ICD-10-CM

## 2016-04-01 DIAGNOSIS — Z0000 Encounter for general adult medical examination without abnormal findings: Secondary | ICD-10-CM

## 2016-11-02 ENCOUNTER — Ambulatory Visit (INDEPENDENT_AMBULATORY_CARE_PROVIDER_SITE_OTHER): Payer: 59 | Admitting: Unknown Physician Specialty

## 2016-11-02 ENCOUNTER — Encounter: Payer: Self-pay | Admitting: Unknown Physician Specialty

## 2016-11-02 VITALS — BP 125/81 | HR 81 | Temp 97.8°F | Ht 68.2 in | Wt 195.2 lb

## 2016-11-02 DIAGNOSIS — E781 Pure hyperglyceridemia: Secondary | ICD-10-CM | POA: Diagnosis not present

## 2016-11-02 DIAGNOSIS — F5101 Primary insomnia: Secondary | ICD-10-CM

## 2016-11-02 DIAGNOSIS — Z0000 Encounter for general adult medical examination without abnormal findings: Secondary | ICD-10-CM | POA: Diagnosis not present

## 2016-11-02 DIAGNOSIS — G4700 Insomnia, unspecified: Secondary | ICD-10-CM | POA: Insufficient documentation

## 2016-11-02 DIAGNOSIS — G43909 Migraine, unspecified, not intractable, without status migrainosus: Secondary | ICD-10-CM | POA: Diagnosis not present

## 2016-11-02 MED ORDER — ACYCLOVIR 400 MG PO TABS: 400.0000 mg | ORAL_TABLET | Freq: Three times a day (TID) | ORAL | 2 refills | Status: DC

## 2016-11-02 MED ORDER — ZOLMITRIPTAN 5 MG PO TABS: 5.0000 mg | ORAL_TABLET | Freq: Every day | ORAL | 12 refills | Status: DC | PRN

## 2016-11-02 MED ORDER — SUMATRIPTAN SUCCINATE 100 MG PO TABS: 100.0000 mg | ORAL_TABLET | ORAL | 12 refills | Status: DC | PRN

## 2016-11-02 NOTE — Assessment & Plan Note (Signed)
Discussed strategies

## 2016-11-02 NOTE — Patient Instructions (Addendum)
Take Magnesium Glycinate at night.    [email or web address] or [email or web address]  Schedule worry time.

## 2016-11-02 NOTE — Progress Notes (Signed)
BP 125/81 (BP Location: Left Arm, Patient Position: Sitting, Cuff Size: Large)   Pulse 81   Temp 97.8 F (36.6 C)   Ht 5' 8.2" (1.732 m)   Wt 195 lb 3.2 oz (88.5 kg)   LMP  (LMP Unknown)   SpO2 98%   BMI 29.51 kg/m    Subjective:    Patient ID: Debra Cannon, female    DOB: 16-Jul-1956, 61 y.o.   MRN: [id number]  HPI: Debra Cannon is a 61 y.o. female  Chief Complaint  Patient presents with  . Annual Exam   Migraines Still gets them on occasion  Insomnia Trouble staying asleep Depression screen [facility] 2/9 11/02/2016 09/09/2015  Decreased Interest 1 1  Down, Depressed, Hopeless 1 1  PHQ - 2 Score 2 2  Altered sleeping 3 1  Tired, decreased energy 2 1  Change in appetite 0 0  Feeling bad or failure about yourself  1 0  Trouble concentrating 0 0  Moving slowly or fidgety/restless 0 0  Suicidal thoughts 0 0  PHQ-9 Score 8 4  Difficult doing work/chores - Somewhat difficult     Social History   Social History  . Marital status: Married    Spouse name: N/A  . Number of children: N/A  . Years of education: N/A   Occupational History  . Not on file.   Social History Main Topics  . Smoking status: Former Research scientist (life sciences)  . Smokeless tobacco: Never Used  . Alcohol use 0.0 oz/week     Comment: pt states very rare  . Drug use: No  . Sexual activity: Yes   Other Topics Concern  . Not on file   Social History Narrative  . No narrative on file   Family History  Problem Relation Age of Onset  . Osteoporosis Mother   . Thyroid disease Mother   . Heart disease Father   . Cancer Sister     endometrial  . Cardiomyopathy Brother   . Cancer Maternal Grandmother     vulvar  . Diabetes Maternal Grandmother   . AAA (abdominal aortic aneurysm) Maternal Grandfather   . Stroke Paternal Grandmother   . Heart disease Paternal Grandfather   . Breast cancer Neg Hx    Past Medical History:  Diagnosis Date  . Herpes   . Hypercholesteremia   . Menopause   . Migraine        Relevant past medical, surgical, family and social history reviewed and updated as indicated. Interim medical history since our last visit reviewed. Allergies and medications reviewed and updated.  Review of Systems  Per HPI unless specifically indicated above     Objective:    BP 125/81 (BP Location: Left Arm, Patient Position: Sitting, Cuff Size: Large)   Pulse 81   Temp 97.8 F (36.6 C)   Ht 5' 8.2" (1.732 m)   Wt 195 lb 3.2 oz (88.5 kg)   LMP  (LMP Unknown)   SpO2 98%   BMI 29.51 kg/m   Wt Readings from Last 3 Encounters:  11/02/16 195 lb 3.2 oz (88.5 kg)  09/09/15 192 lb 12.8 oz (87.5 kg)  08/27/14 188 lb (85.3 kg)    Physical Exam  Constitutional: She is oriented to person, place, and time. She appears well-developed and well-nourished.  HENT:  Head: Normocephalic and atraumatic.  Eyes: Pupils are equal, round, and reactive to light. Right eye exhibits no discharge. Left eye exhibits no discharge. No scleral icterus.  Neck: Normal range of  motion. Neck supple. Carotid bruit is not present. No thyromegaly present.  Cardiovascular: Normal rate, regular rhythm and normal heart sounds.  Exam reveals no gallop and no friction rub.   No murmur heard. Pulmonary/Chest: Effort normal and breath sounds normal. No respiratory distress. She has no wheezes. She has no rales.  Abdominal: Soft. Bowel sounds are normal. There is no tenderness. There is no rebound.  Genitourinary: No breast swelling, tenderness or discharge.  Musculoskeletal: Normal range of motion.  Lymphadenopathy:    She has no cervical adenopathy.  Neurological: She is alert and oriented to person, place, and time.  Skin: Skin is warm, dry and intact. No rash noted.  Psychiatric: She has a normal mood and affect. Her speech is normal and behavior is normal. Judgment and thought content normal. Cognition and memory are normal.   +  n     Assessment & Plan:   Problem List Items Addressed This Visit       Unprioritized   Insomnia    Discussed strategies      Migraine   Relevant Medications   SUMAtriptan (IMITREX) 100 MG tablet   zolmitriptan (ZOMIG) 5 MG tablet    Other Visit Diagnoses    Annual physical exam    -  Primary   Relevant Orders   CBC with Differential/Platelet   Comprehensive metabolic panel   Lipid Panel w/o Chol/HDL Ratio   TSH   VITAMIN D 25 Hydroxy (Vit-D Deficiency, Fractures)       Follow up plan: Return in about 1 year (around 11/02/2017).

## 2016-11-03 LAB — COMPREHENSIVE METABOLIC PANEL
A/G RATIO: 1.9 (ref 1.2–2.2)
ALBUMIN: 4.5 g/dL (ref 3.5–5.5)
ALT: 22 IU/L (ref 0–32)
AST: 18 IU/L (ref 0–40)
Alkaline Phosphatase: 104 IU/L (ref 39–117)
BILIRUBIN TOTAL: 0.4 mg/dL (ref 0.0–1.2)
BUN / CREAT RATIO: 13 (ref 9–23)
BUN: 11 mg/dL (ref 6–24)
CALCIUM: 9.2 mg/dL (ref 8.7–10.2)
CHLORIDE: 99 mmol/L (ref 96–106)
CO2: 22 mmol/L (ref 18–29)
Creatinine, Ser: 0.88 mg/dL (ref 0.57–1.00)
GFR, EST AFRICAN AMERICAN: 84 mL/min/{1.73_m2} (ref >59)
GFR, EST NON AFRICAN AMERICAN: 73 mL/min/{1.73_m2} (ref >59)
GLOBULIN, TOTAL: 2.4 g/dL (ref 1.5–4.5)
Glucose: 87 mg/dL (ref 65–99)
POTASSIUM: 4.9 mmol/L (ref 3.5–5.2)
SODIUM: 139 mmol/L (ref 134–144)
TOTAL PROTEIN: 6.9 g/dL (ref 6.0–8.5)

## 2016-11-03 LAB — CBC WITH DIFFERENTIAL/PLATELET
Basophils Absolute: 0 10*3/uL (ref 0.0–0.2)
Basos: 0 %
EOS (ABSOLUTE): 0.3 10*3/uL (ref 0.0–0.4)
EOS: 3 %
HEMOGLOBIN: 15.1 g/dL (ref 11.1–15.9)
Hematocrit: 43.1 % (ref 34.0–46.6)
IMMATURE GRANS (ABS): 0.1 10*3/uL (ref 0.0–0.1)
IMMATURE GRANULOCYTES: 1 %
LYMPHS: 24 %
Lymphocytes Absolute: 2.6 10*3/uL (ref 0.7–3.1)
MCH: 31 pg (ref 26.6–33.0)
MCHC: 35 g/dL (ref 31.5–35.7)
MCV: 89 fL (ref 79–97)
Monocytes Absolute: 0.9 10*3/uL (ref 0.1–0.9)
Monocytes: 8 %
NEUTROS ABS: 7 10*3/uL (ref 1.4–7.0)
NEUTROS PCT: 64 %
PLATELETS: 406 10*3/uL — AB (ref 150–379)
RBC: 4.87 x10E6/uL (ref 3.77–5.28)
RDW: 12.4 % (ref 12.3–15.4)
WBC: 10.8 10*3/uL (ref 3.4–10.8)

## 2016-11-03 LAB — VITAMIN D 25 HYDROXY (VIT D DEFICIENCY, FRACTURES): VIT D 25 HYDROXY: 19.7 ng/mL — AB (ref 30.0–100.0)

## 2016-11-03 LAB — LIPID PANEL W/O CHOL/HDL RATIO
Cholesterol, Total: 240 mg/dL — ABNORMAL HIGH (ref 100–199)
HDL: 30 mg/dL — AB (ref >39)
Triglycerides: 529 mg/dL — ABNORMAL HIGH (ref 0–149)

## 2016-11-03 LAB — TSH: TSH: 2.59 u[IU]/mL (ref 0.450–4.500)

## 2016-11-03 NOTE — Addendum Note (Signed)
Addended by: Kathrine Haddock on: 11/03/2016 07:51 AM   Modules accepted: Orders

## 2016-11-05 ENCOUNTER — Encounter: Payer: Self-pay | Admitting: Unknown Physician Specialty

## 2016-11-06 ENCOUNTER — Ambulatory Visit: Payer: Self-pay | Admitting: Physician Assistant

## 2016-11-06 ENCOUNTER — Encounter: Payer: Self-pay | Admitting: Physician Assistant

## 2016-11-06 VITALS — BP 122/80 | HR 73 | Temp 97.9°F

## 2016-11-06 DIAGNOSIS — J0100 Acute maxillary sinusitis, unspecified: Secondary | ICD-10-CM

## 2016-11-06 MED ORDER — AMOXICILLIN 875 MG PO TABS: 875.0000 mg | ORAL_TABLET | Freq: Two times a day (BID) | ORAL | 0 refills | Status: DC

## 2016-11-06 MED ORDER — PREDNISONE 10 MG PO TABS: 30.0000 mg | ORAL_TABLET | Freq: Every day | ORAL | 0 refills | Status: DC

## 2016-11-06 NOTE — Progress Notes (Signed)
S: C/o sinus pain and pressure for 1-2 days, no fever, chills, cp/sob, v/d; has pressure at teeth and eustachean tube on the left Using otc meds:   O: PE: vitals wnl, nad, perrl eomi, normocephalic, tms dull, nasal mucosa red and swollen, throat injected, neck supple no lymph, lungs c t a, cv rrr, neuro intact  A:  Acute sinusitis   P: drink fluids, continue regular meds , use otc meds of choice, return if not improving in 5 days, return earlier if worsening , amoxil, pred 30 mg qd x 3d

## 2016-12-09 ENCOUNTER — Encounter: Payer: Self-pay | Admitting: Physician Assistant

## 2016-12-09 ENCOUNTER — Ambulatory Visit: Payer: Self-pay | Admitting: Physician Assistant

## 2016-12-09 VITALS — BP 108/80 | HR 88 | Temp 98.8°F

## 2016-12-09 DIAGNOSIS — J069 Acute upper respiratory infection, unspecified: Secondary | ICD-10-CM

## 2016-12-09 LAB — POCT INFLUENZA A/B
INFLUENZA A, POC: NEGATIVE
INFLUENZA B, POC: NEGATIVE

## 2016-12-09 MED ORDER — FLUCONAZOLE 150 MG PO TABS: 150.0000 mg | ORAL_TABLET | Freq: Once | ORAL | 0 refills | Status: AC

## 2016-12-09 MED ORDER — CEFDINIR 300 MG PO CAPS: 300.0000 mg | ORAL_CAPSULE | Freq: Two times a day (BID) | ORAL | 0 refills | Status: DC

## 2016-12-09 NOTE — Progress Notes (Signed)
S: C/o runny nose and congestion with dry cough for 2 days, + fever, chills, denies cp/sob, v/d; can't get mucus out;  cough is sporadic,   Using otc meds: tylenol, cough med  O: PE: vitals wnl, nad,  perrl eomi, normocephalic, tms dull, nasal mucosa red and swollen, throat injected, neck supple no lymph, lungs c t a, cv rrr, neuro intact, flu swab neg  A:  Acute uri  P: drink fluids, continue regular meds , use otc meds of choice, return if not improving in 5 days, return earlier if worsening , omnicef, diflucan

## 2016-12-23 ENCOUNTER — Ambulatory Visit: Payer: Self-pay | Admitting: Physician Assistant

## 2017-01-18 ENCOUNTER — Telehealth: Payer: Self-pay | Admitting: Unknown Physician Specialty

## 2017-01-18 NOTE — Telephone Encounter (Signed)
Routing to provider  

## 2017-01-18 NOTE — Telephone Encounter (Signed)
Pt called and would like to know if she could have wellbutrin sent to the [facility].

## 2017-01-19 NOTE — Telephone Encounter (Signed)
This is a new medication that she needs an appointment to discuss

## 2017-01-19 NOTE — Telephone Encounter (Signed)
Called and left patient a VM letting her know that she would need an appointment to discuss starting medication. Asked for her to give [place] a call to schedule appointment.

## 2017-04-13 ENCOUNTER — Other Ambulatory Visit: Payer: Self-pay | Admitting: Unknown Physician Specialty

## 2017-04-13 DIAGNOSIS — Z1231 Encounter for screening mammogram for malignant neoplasm of breast: Secondary | ICD-10-CM

## 2017-04-14 ENCOUNTER — Ambulatory Visit
Admission: RE | Admit: 2017-04-14 | Discharge: 2017-04-14 | Disposition: A | Discharge status: Home / Self Care | Payer: 59 | Source: Ambulatory Visit | Attending: Unknown Physician Specialty | Admitting: Unknown Physician Specialty

## 2017-04-14 DIAGNOSIS — Z1231 Encounter for screening mammogram for malignant neoplasm of breast: Secondary | ICD-10-CM | POA: Diagnosis not present

## 2017-04-27 ENCOUNTER — Ambulatory Visit: Payer: Self-pay | Admitting: Physician Assistant

## 2017-04-27 VITALS — BP 120/80 | HR 80 | Temp 97.9°F

## 2017-04-27 DIAGNOSIS — L0201 Cutaneous abscess of face: Secondary | ICD-10-CM

## 2017-04-27 MED ORDER — FLUCONAZOLE 150 MG PO TABS: ORAL_TABLET | ORAL | 0 refills | Status: DC

## 2017-04-27 MED ORDER — SULFAMETHOXAZOLE-TRIMETHOPRIM 800-160 MG PO TABS: 1.0000 | ORAL_TABLET | Freq: Two times a day (BID) | ORAL | 0 refills | Status: DC

## 2017-04-27 NOTE — Progress Notes (Signed)
S: c/o small bump on face for about 2 months, area is sore, talked with one of the surgeons yesterday and they said to try antibiotics, has been using a warm compress, no fever/chills  O: vitals wnl, nad, skin with pea sized red raised bump with ?head in center, neck supple no lymph, lungs c t a, cv rrr  A: facial abscess  P: septra ds 1 po bid x 7d, diflucan

## 2017-07-08 ENCOUNTER — Encounter: Payer: Self-pay | Admitting: Physician Assistant

## 2017-07-08 ENCOUNTER — Ambulatory Visit: Payer: Self-pay | Admitting: Physician Assistant

## 2017-07-08 VITALS — BP 110/70 | HR 74 | Temp 98.7°F

## 2017-07-08 DIAGNOSIS — H68002 Unspecified Eustachian salpingitis, left ear: Secondary | ICD-10-CM

## 2017-07-08 MED ORDER — METHYLPREDNISOLONE 4 MG PO TBPK: ORAL_TABLET | ORAL | 0 refills | Status: DC

## 2017-07-08 MED ORDER — FEXOFENADINE-PSEUDOEPHED ER 60-120 MG PO TB12: 1.0000 | ORAL_TABLET | Freq: Two times a day (BID) | ORAL | 0 refills | Status: DC

## 2017-07-08 NOTE — Progress Notes (Signed)
   Subjective:left ear pain    Patient ID: Debra Cannon, female    DOB: 09-24-1956, 61 y.o.   MRN: [id number]  HPI Patient c/o left ear pain for 4 days. Denies hearing loss. No relief with OTC medication.    Review of Systems Negative except for compliant.    Objective:   Physical Exam HEENT revealed left maxillary guarding. Edematous, but non-erythematous left TM. Neck supple w/o adenopathy. Lungs CTA and Heart RRR.       Assessment & Plan:Eustachian Tube dysfunction  Allergra -D and prednisone as directed. Follow up 5 days if no improvement.

## 2017-10-05 HISTORY — PX: BREAST EXCISIONAL BIOPSY: SUR124

## 2017-11-05 ENCOUNTER — Other Ambulatory Visit: Payer: Self-pay | Admitting: Unknown Physician Specialty

## 2017-11-05 NOTE — Telephone Encounter (Signed)
Sumatriptan LR: 11/02/16 #9 12 RF  LOV: 11/02/16  Zolmitriptan  LR:11/02/16 #6 tab 12 RF  LOV: 11/02/16  Has upcoming appt 12/10/17   Filled both x 1 month.

## 2017-12-10 ENCOUNTER — Encounter: Payer: 59 | Admitting: Unknown Physician Specialty

## 2017-12-24 ENCOUNTER — Encounter: Payer: 59 | Admitting: Unknown Physician Specialty

## 2018-01-03 ENCOUNTER — Other Ambulatory Visit: Payer: Self-pay

## 2018-01-03 ENCOUNTER — Encounter: Payer: Self-pay | Admitting: Unknown Physician Specialty

## 2018-01-03 ENCOUNTER — Ambulatory Visit (INDEPENDENT_AMBULATORY_CARE_PROVIDER_SITE_OTHER): Payer: 59 | Admitting: Unknown Physician Specialty

## 2018-01-03 VITALS — BP 126/78 | HR 76 | Temp 98.4°F | Ht 66.7 in | Wt 190.7 lb

## 2018-01-03 DIAGNOSIS — R5383 Other fatigue: Secondary | ICD-10-CM | POA: Diagnosis not present

## 2018-01-03 DIAGNOSIS — Z0000 Encounter for general adult medical examination without abnormal findings: Secondary | ICD-10-CM | POA: Diagnosis not present

## 2018-01-03 DIAGNOSIS — F325 Major depressive disorder, single episode, in full remission: Secondary | ICD-10-CM | POA: Insufficient documentation

## 2018-01-03 DIAGNOSIS — F5101 Primary insomnia: Secondary | ICD-10-CM | POA: Diagnosis not present

## 2018-01-03 DIAGNOSIS — G43909 Migraine, unspecified, not intractable, without status migrainosus: Secondary | ICD-10-CM

## 2018-01-03 DIAGNOSIS — Z0001 Encounter for general adult medical examination with abnormal findings: Secondary | ICD-10-CM

## 2018-01-03 DIAGNOSIS — F322 Major depressive disorder, single episode, severe without psychotic features: Secondary | ICD-10-CM

## 2018-01-03 DIAGNOSIS — Z124 Encounter for screening for malignant neoplasm of cervix: Secondary | ICD-10-CM

## 2018-01-03 MED ORDER — CITALOPRAM HYDROBROMIDE 20 MG PO TABS: 20.0000 mg | ORAL_TABLET | Freq: Every day | ORAL | 3 refills | Status: DC

## 2018-01-03 NOTE — Progress Notes (Signed)
BP 126/78   Pulse 76   Temp 98.4 F (36.9 C) (Oral)   Ht 5' 6.7" (1.694 m)   Wt 190 lb 11.2 oz (86.5 kg)   LMP  (LMP Unknown)   SpO2 96%   BMI 30.14 kg/m    Subjective:    Patient ID: Debra Cannon, female    DOB: [birth year], 62 y.o.   MRN: [id number]  HPI: Debra Cannon is a 62 y.o. female  Chief Complaint  Patient presents with  . Annual Exam   Depression Pt comes in today with complaints of depression due to a lot overwhelming factors.  She is seeing counseling and tested positive for depression.  She is interested in starting a medication.  Had been on Wellbutrin and Serzone in the past.   Depression screen [facility] 2/9 01/03/2018 11/02/2016 09/09/2015  Decreased Interest 3 1 1   Down, Depressed, Hopeless 3 1 1   PHQ - 2 Score 6 2 2   Altered sleeping 2 3 1   Tired, decreased energy 3 2 1   Change in appetite 0 0 0  Feeling bad or failure about yourself  2 1 0  Trouble concentrating 2 0 0  Moving slowly or fidgety/restless 0 0 0  Suicidal thoughts 3 0 0  PHQ-9 Score 18 8 4   Difficult doing work/chores - - Somewhat difficult   Migraine On the same medications. Taking medications several times a month  Social History   Socioeconomic History  . Marital status: Married    Spouse name: Not on file  . Number of children: Not on file  . Years of education: Not on file  . Highest education level: Not on file  Occupational History  . Not on file  Social Needs  . Financial resource strain: Not on file  . Food insecurity:    Worry: Not on file    Inability: Not on file  . Transportation needs:    Medical: Not on file    Non-medical: Not on file  Tobacco Use  . Smoking status: Former Research scientist (life sciences)  . Smokeless tobacco: Never Used  Substance and Sexual Activity  . Alcohol use: Yes    Alcohol/week: 0.0 oz    Comment: pt states very rare  . Drug use: No  . Sexual activity: Yes  Lifestyle  . Physical activity:    Days per week: Not on file    Minutes per session: Not on file    . Stress: Not on file  Relationships  . Social connections:    Talks on phone: Not on file    Gets together: Not on file    Attends religious service: Not on file    Active member of club or organization: Not on file    Attends meetings of clubs or organizations: Not on file    Relationship status: Not on file  . Intimate partner violence:    Fear of current or ex partner: Not on file    Emotionally abused: Not on file    Physically abused: Not on file    Forced sexual activity: Not on file  Other Topics Concern  . Not on file  Social History Narrative  . Not on file   Family History  Problem Relation Age of Onset  . Osteoporosis Mother   . Thyroid disease Mother   . Heart disease Father   . Cancer Sister        endometrial  . Cardiomyopathy Brother   . Cancer Maternal Grandmother  vulvar  . Diabetes Maternal Grandmother   . AAA (abdominal aortic aneurysm) Maternal Grandfather   . Stroke Paternal Grandmother   . Heart disease Paternal Grandfather   . Breast cancer Neg Hx    Past Medical History:  Diagnosis Date  . Herpes   . Hypercholesteremia   . Menopause   . Migraine    Past Surgical History:  Procedure Laterality Date  . APPENDECTOMY    . REPAIR ANKLE LIGAMENT Left 2004 & 2005     Relevant past medical, surgical, family and social history reviewed and updated as indicated. Interim medical history since our last visit reviewed. Allergies and medications reviewed and updated.  Review of Systems  Constitutional: Positive for fatigue.       Insomnia  HENT: Negative.   Respiratory: Negative.   Cardiovascular: Negative.   Gastrointestinal: Negative.   Skin: Negative.   Neurological: Negative.   Psychiatric/Behavioral: Negative.     Per HPI unless specifically indicated above     Objective:    BP 126/78   Pulse 76   Temp 98.4 F (36.9 C) (Oral)   Ht 5' 6.7" (1.694 m)   Wt 190 lb 11.2 oz (86.5 kg)   LMP  (LMP Unknown)   SpO2 96%   BMI  30.14 kg/m   Wt Readings from Last 3 Encounters:  01/03/18 190 lb 11.2 oz (86.5 kg)  11/02/16 195 lb 3.2 oz (88.5 kg)  09/09/15 192 lb 12.8 oz (87.5 kg)    Physical Exam  Constitutional: She is oriented to person, place, and time. She appears well-developed and well-nourished.  HENT:  Head: Normocephalic and atraumatic.  Eyes: Pupils are equal, round, and reactive to light. Right eye exhibits no discharge. Left eye exhibits no discharge. No scleral icterus.  Neck: Normal range of motion. Neck supple. Carotid bruit is not present. No thyromegaly present.  Cardiovascular: Normal rate, regular rhythm and normal heart sounds. Exam reveals no gallop and no friction rub.  No murmur heard. Pulmonary/Chest: Effort normal and breath sounds normal. No respiratory distress. She has no wheezes. She has no rales. No breast swelling, tenderness or discharge.  Abdominal: Soft. Bowel sounds are normal. There is no tenderness. There is no rebound.  Genitourinary: Vagina normal and uterus normal. No breast swelling, tenderness or discharge. Cervix exhibits no motion tenderness, no discharge and no friability. Right adnexum displays no mass, no tenderness and no fullness. Left adnexum displays no mass, no tenderness and no fullness.  Musculoskeletal: Normal range of motion.  Lymphadenopathy:    She has no cervical adenopathy.  Neurological: She is alert and oriented to person, place, and time.  Skin: Skin is warm, dry and intact. No rash noted.  Psychiatric: She has a normal mood and affect. Her speech is normal and behavior is normal. Judgment and thought content normal. Cognition and memory are normal.    Results for orders placed or performed in visit on 12/09/16  POCT Influenza A/B (POC66)  Result Value Ref Range   Influenza A, POC Negative Negative   Influenza B, POC Negative Negative      Assessment & Plan:   Problem List Items Addressed This Visit      Unprioritized   Depression, major,  single episode, severe ([place])    Start Citalopram.  I've explained to her that drugs of the SSRI class can have side effects such as weight gain, sexual dysfunction, insomnia, headache, nausea. These medications are generally effective at alleviating symptoms of anxiety and/or depression. Let me  know if significant side effects do occur.        Relevant Medications   citalopram (CELEXA) 20 MG tablet   Fatigue    Probably related to depression.  Discussed exercise      Relevant Orders   CBC with Differential/Platelet   VITAMIN D 25 Hydroxy (Vit-D Deficiency, Fractures)   TSH   Insomnia    Discussed sleep hygeine.        Migraine    Takes Imitrex and Zomig and reasonable controlled.        Relevant Medications   citalopram (CELEXA) 20 MG tablet    Other Visit Diagnoses    Annual physical exam    -  Primary   Relevant Orders   Comprehensive metabolic panel   Lipid Panel w/o Chol/HDL Ratio       Follow up plan: Return in about 1 month (around 01/31/2018).

## 2018-01-03 NOTE — Assessment & Plan Note (Signed)
Takes Imitrex and Zomig and reasonable controlled.

## 2018-01-03 NOTE — Assessment & Plan Note (Signed)
Start Citalopram.  I've explained to her that drugs of the SSRI class can have side effects such as weight gain, sexual dysfunction, insomnia, headache, nausea. These medications are generally effective at alleviating symptoms of anxiety and/or depression. Let me know if significant side effects do occur.

## 2018-01-03 NOTE — Addendum Note (Signed)
Addended by: Kathrine Haddock on: 01/03/2018 04:10 PM   Modules accepted: Orders

## 2018-01-03 NOTE — Assessment & Plan Note (Signed)
Probably related to depression.  Discussed exercise

## 2018-01-03 NOTE — Assessment & Plan Note (Signed)
Discussed sleep hygeine 

## 2018-01-03 NOTE — Patient Instructions (Signed)
Preventive Care 40-64 Years, Female Preventive care refers to lifestyle choices and visits with your health care provider that can promote health and wellness. What does preventive care include?  A yearly physical exam. This is also called an annual well check.  Dental exams once or twice a year.  Routine eye exams. Ask your health care provider how often you should have your eyes checked.  Personal lifestyle choices, including: ? Daily care of your teeth and gums. ? Regular physical activity. ? Eating a healthy diet. ? Avoiding tobacco and drug use. ? Limiting alcohol use. ? Practicing safe sex. ? Taking low-dose aspirin daily starting at age 58. ? Taking vitamin and mineral supplements as recommended by your health care provider. What happens during an annual well check? The services and screenings done by your health care provider during your annual well check will depend on your age, overall health, lifestyle risk factors, and family history of disease. Counseling Your health care provider may ask you questions about your:  Alcohol use.  Tobacco use.  Drug use.  Emotional well-being.  Home and relationship well-being.  Sexual activity.  Eating habits.  Work and work Statistician.  Method of birth control.  Menstrual cycle.  Pregnancy history.  Screening You may have the following tests or measurements:  Height, weight, and BMI.  Blood pressure.  Lipid and cholesterol levels. These may be checked every 5 years, or more frequently if you are over 81 years old.  Skin check.  Lung cancer screening. You may have this screening every year starting at age 78 if you have a 30-pack-year history of smoking and currently smoke or have quit within the past 15 years.  Fecal occult blood test (FOBT) of the stool. You may have this test every year starting at age 65.  Flexible sigmoidoscopy or colonoscopy. You may have a sigmoidoscopy every 5 years or a colonoscopy  every 10 years starting at age 30.  Hepatitis C blood test.  Hepatitis B blood test.  Sexually transmitted disease (STD) testing.  Diabetes screening. This is done by checking your blood sugar (glucose) after you have not eaten for a while (fasting). You may have this done every 1-3 years.  Mammogram. This may be done every 1-2 years. Talk to your health care provider about when you should start having regular mammograms. This may depend on whether you have a family history of breast cancer.  BRCA-related cancer screening. This may be done if you have a family history of breast, ovarian, tubal, or peritoneal cancers.  Pelvic exam and Pap test. This may be done every 3 years starting at age 80. Starting at age 36, this may be done every 5 years if you have a Pap test in combination with an HPV test.  Bone density scan. This is done to screen for osteoporosis. You may have this scan if you are at high risk for osteoporosis.  Discuss your test results, treatment options, and if necessary, the need for more tests with your health care provider. Vaccines Your health care provider may recommend certain vaccines, such as:  Influenza vaccine. This is recommended every year.  Tetanus, diphtheria, and acellular pertussis (Tdap, Td) vaccine. You may need a Td booster every 10 years.  Varicella vaccine. You may need this if you have not been vaccinated.  Zoster vaccine. You may need this after age 5.  Measles, mumps, and rubella (MMR) vaccine. You may need at least one dose of MMR if you were born in  1955 or later. You may also need a second dose.  Pneumococcal 13-valent conjugate (PCV13) vaccine. You may need this if you have certain conditions and were not previously vaccinated.  Pneumococcal polysaccharide (PPSV23) vaccine. You may need one or two doses if you smoke cigarettes or if you have certain conditions.  Meningococcal vaccine. You may need this if you have certain  conditions.  Hepatitis A vaccine. You may need this if you have certain conditions or if you travel or work in places where you may be exposed to hepatitis A.  Hepatitis B vaccine. You may need this if you have certain conditions or if you travel or work in places where you may be exposed to hepatitis B.  Haemophilus influenzae type b (Hib) vaccine. You may need this if you have certain conditions.  Talk to your health care provider about which screenings and vaccines you need and how often you need them. This information is not intended to replace advice given to you by your health care provider. Make sure you discuss any questions you have with your health care provider. Document Released: 10/18/2015 Document Revised: 06/10/2016 Document Reviewed: 07/23/2015 Elsevier Interactive Patient Education  2018 [employer] Inc[employer]  

## 2018-01-04 LAB — COMPREHENSIVE METABOLIC PANEL WITH GFR
ALT: 15 IU/L (ref 0–32)
AST: 16 IU/L (ref 0–40)
Albumin/Globulin Ratio: 2.3 — ABNORMAL HIGH (ref 1.2–2.2)
Albumin: 4.5 g/dL (ref 3.5–5.5)
Alkaline Phosphatase: 91 IU/L (ref 39–117)
BUN/Creatinine Ratio: 20 (ref 9–23)
BUN: 14 mg/dL (ref 6–24)
Bilirubin Total: 0.4 mg/dL (ref 0.0–1.2)
CO2: 23 mmol/L (ref 20–29)
Calcium: 9.8 mg/dL (ref 8.7–10.2)
Chloride: 101 mmol/L (ref 96–106)
Creatinine, Ser: 0.71 mg/dL (ref 0.57–1.00)
GFR calc Af Amer: 108 mL/min/1.73
GFR calc non Af Amer: 94 mL/min/1.73
Globulin, Total: 2 g/dL (ref 1.5–4.5)
Glucose: 87 mg/dL (ref 65–99)
Potassium: 4.1 mmol/L (ref 3.5–5.2)
Sodium: 140 mmol/L (ref 134–144)
Total Protein: 6.5 g/dL (ref 6.0–8.5)

## 2018-01-04 LAB — CBC WITH DIFFERENTIAL/PLATELET
Basophils Absolute: 0 x10E3/uL (ref 0.0–0.2)
Basos: 0 %
EOS (ABSOLUTE): 0.5 x10E3/uL — ABNORMAL HIGH (ref 0.0–0.4)
Eos: 5 %
Hematocrit: 41.2 % (ref 34.0–46.6)
Hemoglobin: 14.2 g/dL (ref 11.1–15.9)
Immature Grans (Abs): 0 x10E3/uL (ref 0.0–0.1)
Immature Granulocytes: 1 %
Lymphocytes Absolute: 2.7 x10E3/uL (ref 0.7–3.1)
Lymphs: 31 %
MCH: 31.2 pg (ref 26.6–33.0)
MCHC: 34.5 g/dL (ref 31.5–35.7)
MCV: 91 fL (ref 79–97)
Monocytes Absolute: 0.6 x10E3/uL (ref 0.1–0.9)
Monocytes: 7 %
Neutrophils Absolute: 5 x10E3/uL (ref 1.4–7.0)
Neutrophils: 56 %
Platelets: 358 x10E3/uL (ref 150–379)
RBC: 4.55 x10E6/uL (ref 3.77–5.28)
RDW: 12.5 % (ref 12.3–15.4)
WBC: 8.8 x10E3/uL (ref 3.4–10.8)

## 2018-01-04 LAB — LIPID PANEL W/O CHOL/HDL RATIO
Cholesterol, Total: 222 mg/dL — ABNORMAL HIGH (ref 100–199)
HDL: 31 mg/dL — AB (ref >39)
TRIGLYCERIDES: 474 mg/dL — AB (ref 0–149)

## 2018-01-04 LAB — VITAMIN D 25 HYDROXY (VIT D DEFICIENCY, FRACTURES): VIT D 25 HYDROXY: 23.5 ng/mL — AB (ref 30.0–100.0)

## 2018-01-04 LAB — TSH: TSH: 1.47 u[IU]/mL (ref 0.450–4.500)

## 2018-01-04 NOTE — Progress Notes (Signed)
Pt notified through mychart.

## 2018-01-05 LAB — IGP, APTIMA HPV, RFX 16/18,45
HPV APTIMA: NEGATIVE
PAP Smear Comment: 0

## 2018-01-06 ENCOUNTER — Encounter: Payer: Self-pay | Admitting: General Surgery

## 2018-01-06 ENCOUNTER — Ambulatory Visit (INDEPENDENT_AMBULATORY_CARE_PROVIDER_SITE_OTHER): Payer: 59 | Admitting: General Surgery

## 2018-01-06 VITALS — BP 136/76 | HR 74 | Ht 66.7 in | Wt 190.0 lb

## 2018-01-06 DIAGNOSIS — D171 Benign lipomatous neoplasm of skin and subcutaneous tissue of trunk: Secondary | ICD-10-CM | POA: Diagnosis not present

## 2018-01-06 NOTE — Progress Notes (Signed)
Patient ID: Debra Cannon, female   DOB: Feb 14, 1956, 62 y.o.   MRN: [id number]  Chief Complaint  Patient presents with  . Mass    HPI Debra Cannon is a 62 y.o. female.  who presents for a breast evaluation. The most recent mammogram was done on 04-15-17. She states she can feel a right axillary mass that she noticed this morning. No pain only mild tenderness. Denies any injury or trauma. Patient does perform regular self breast checks and gets regular mammograms done.     HPI  Past Medical History:  Diagnosis Date  . Herpes   . Hypercholesteremia   . Menopause   . Migraine     Past Surgical History:  Procedure Laterality Date  . APPENDECTOMY    . COLONOSCOPY  09/03/2008   Dr Vira Agar  . REPAIR ANKLE LIGAMENT Left 2004 & 2005  . UPPER GI ENDOSCOPY  2009   Dr Vira Agar    Family History  Problem Relation Age of Onset  . Osteoporosis Mother   . Thyroid disease Mother   . Heart disease Father   . Cancer Sister        endometrial  . Cardiomyopathy Brother   . Cancer Maternal Grandmother        vulvar  . Diabetes Maternal Grandmother   . AAA (abdominal aortic aneurysm) Maternal Grandfather   . Stroke Paternal Grandmother   . Heart disease Paternal Grandfather   . Breast cancer Neg Hx     Social History Social History   Tobacco Use  . Smoking status: Current Some Day Smoker    Years: 30.00    Types: Cigarettes  . Smokeless tobacco: Never Used  Substance Use Topics  . Alcohol use: Yes    Alcohol/week: 0.0 oz    Comment: pt states very rare  . Drug use: No    No Known Allergies  Current Outpatient Medications  Medication Sig Dispense Refill  . acyclovir (ZOVIRAX) 400 MG tablet Take 1 tablet (400 mg total) by mouth 3 (three) times daily. 90 tablet 2  . B Complex Vitamins (VITAMIN B COMPLEX) TABS Take 1 tablet by mouth daily.    . cholecalciferol (VITAMIN D) 1000 units tablet Take 1,000 Units by mouth daily.    . citalopram (CELEXA) 20 MG tablet Take 1 tablet (20  mg total) by mouth daily. 30 tablet 3  . fexofenadine-pseudoephedrine (ALLEGRA-D) 60-120 MG 12 hr tablet Take 1 tablet by mouth 2 (two) times daily. 20 tablet 0  . MAGNESIUM GLYCINATE PLUS PO Take 1 tablet by mouth as needed.    . SUMAtriptan (IMITREX) 100 MG tablet TAKE 1 TABLET (100 MG TOTAL) BY MOUTH AS NEEDED. MAY REPEAT IN 2 HOURS IF HEADACHE PERSISTS OR RECURS. 9 tablet 0  . zolmitriptan (ZOMIG) 5 MG tablet TAKE 1 TABLET (5 MG TOTAL) BY MOUTH DAILY AS NEEDED. 6 tablet 0   No current facility-administered medications for this visit.     Review of Systems Review of Systems  Constitutional: Negative.   Respiratory: Negative.   Cardiovascular: Negative.     Blood pressure 136/76, pulse 74, height 5' 6.7" (1.694 m), weight 190 lb (86.2 kg), SpO2 98 %.  Physical Exam Physical Exam  Constitutional: She is oriented to person, place, and time. She appears well-developed and well-nourished.  HENT:  Mouth/Throat: Oropharynx is clear and moist.  Eyes: Conjunctivae are normal. No scleral icterus.  Neck: Neck supple.  Cardiovascular: Normal rate, regular rhythm and normal heart sounds.  Pulmonary/Chest: Effort  normal and breath sounds normal.  Musculoskeletal:       Arms: Lymphadenopathy:    She has no cervical adenopathy.    She has no axillary adenopathy.  Lipoma 4 x 5 right posterior axillary line at T 7  Neurological: She is alert and oriented to person, place, and time.  Skin: Skin is warm and dry.  Psychiatric: Her behavior is normal.       Assessment  Lipoma right chest wall.     Plan    Indications for excision reviewed: 1) enlargement, 2) pain, 3) discomfort from presence.  Discussed office excision, she will call back if she would like this done     HPI, Physical Exam, Assessment and Plan have been scribed under the direction and in the presence of Robert Bellow, MD. Karie Fetch, RN  I have completed the exam and reviewed the above documentation for  accuracy and completeness.  I agree with the above.  Haematologist has been used and any errors in dictation or transcription are unintentional.  Hervey Ard, M.D., F.A.C.S.  Forest Gleason Carlia Bomkamp 01/06/2018, 8:34 PM

## 2018-01-06 NOTE — Patient Instructions (Addendum)
The patient is aware to call back for any questions or concerns.  Call if desires office excision

## 2018-01-25 ENCOUNTER — Other Ambulatory Visit: Payer: Self-pay | Admitting: Unknown Physician Specialty

## 2018-01-25 NOTE — Telephone Encounter (Signed)
Rx refill request for provider review: last filled 11/05/17 # 6  LOV: 01/03/18  PCP: [place]: verified

## 2018-01-31 ENCOUNTER — Encounter: Payer: Self-pay | Admitting: Unknown Physician Specialty

## 2018-01-31 ENCOUNTER — Ambulatory Visit: Payer: 59 | Admitting: Unknown Physician Specialty

## 2018-01-31 VITALS — BP 116/78 | HR 72 | Temp 98.5°F | Ht 66.7 in | Wt 188.1 lb

## 2018-01-31 DIAGNOSIS — D171 Benign lipomatous neoplasm of skin and subcutaneous tissue of trunk: Secondary | ICD-10-CM | POA: Diagnosis not present

## 2018-01-31 DIAGNOSIS — E781 Pure hyperglyceridemia: Secondary | ICD-10-CM

## 2018-01-31 DIAGNOSIS — F322 Major depressive disorder, single episode, severe without psychotic features: Secondary | ICD-10-CM

## 2018-01-31 NOTE — Assessment & Plan Note (Signed)
Improved with current medication.  Will recheck in 6 weeks.

## 2018-01-31 NOTE — Progress Notes (Signed)
BP 116/78   Pulse 72   Temp 98.5 F (36.9 C) (Oral)   Ht 5' 6.7" (1.694 m)   Wt 188 lb 1.6 oz (85.3 kg)   LMP  (LMP Unknown)   SpO2 97%   BMI 29.73 kg/m    Subjective:    Patient ID: Debra Cannon, female    DOB: [birth year], 62 y.o.   MRN: [id number]  HPI: Debra Cannon is a 62 y.o. female  Chief Complaint  Patient presents with  . Depression    4 week f/up   Depression Pt states she feels "a little better."  Still has a sense of sadness.  Still wakes up in the middle of night and can't go back to sleep.   Depression screen [facility] 2/9 01/31/2018 01/03/2018 11/02/2016 09/09/2015  Decreased Interest 1 3 1 1   Down, Depressed, Hopeless 2 3 1 1   PHQ - 2 Score 3 6 2 2   Altered sleeping 1 2 3 1   Tired, decreased energy 1 3 2 1   Change in appetite 0 0 0 0  Feeling bad or failure about yourself  1 2 1  0  Trouble concentrating 1 2 0 0  Moving slowly or fidgety/restless 0 0 0 0  Suicidal thoughts 1 3 0 0  PHQ-9 Score 8 18 8 4   Difficult doing work/chores - - - Somewhat difficult   Lump Right side.  Talked to Dr. Fleet Contras.  Thinks it's a Lipoma.  Planning on getting it removed.    Relevant past medical, surgical, family and social history reviewed and updated as indicated. Interim medical history since our last visit reviewed. Allergies and medications reviewed and updated.  Review of Systems  Per HPI unless specifically indicated above     Objective:    BP 116/78   Pulse 72   Temp 98.5 F (36.9 C) (Oral)   Ht 5' 6.7" (1.694 m)   Wt 188 lb 1.6 oz (85.3 kg)   LMP  (LMP Unknown)   SpO2 97%   BMI 29.73 kg/m   Wt Readings from Last 3 Encounters:  01/31/18 188 lb 1.6 oz (85.3 kg)  01/06/18 190 lb (86.2 kg)  01/03/18 190 lb 11.2 oz (86.5 kg)    Physical Exam  Constitutional: She is oriented to person, place, and time. She appears well-developed and well-nourished. No distress.  HENT:  Head: Normocephalic and atraumatic.  Eyes: Conjunctivae and lids are normal. Right eye  exhibits no discharge. Left eye exhibits no discharge. No scleral icterus.  Cardiovascular: Normal rate.  Pulmonary/Chest: Effort normal.  Abdominal: Normal appearance. There is no splenomegaly or hepatomegaly.  Musculoskeletal: Normal range of motion.  Neurological: She is alert and oriented to person, place, and time.  Skin: Skin is intact. No rash noted. No pallor.  Psychiatric: She has a normal mood and affect. Her behavior is normal. Judgment and thought content normal.    Results for orders placed or performed in visit on 01/03/18  CBC with Differential/Platelet  Result Value Ref Range   WBC 8.8 3.4 - 10.8 x10E3/uL   RBC 4.55 3.77 - 5.28 x10E6/uL   Hemoglobin 14.2 11.1 - 15.9 g/dL   Hematocrit 41.2 34.0 - 46.6 %   MCV 91 79 - 97 fL   MCH 31.2 26.6 - 33.0 pg   MCHC 34.5 31.5 - 35.7 g/dL   RDW 12.5 12.3 - 15.4 %   Platelets 358 150 - 379 x10E3/uL   Neutrophils 56 Not Estab. %   Lymphs 31  Not Estab. %   Monocytes 7 Not Estab. %   Eos 5 Not Estab. %   Basos 0 Not Estab. %   Neutrophils Absolute 5.0 1.4 - 7.0 x10E3/uL   Lymphocytes Absolute 2.7 0.7 - 3.1 x10E3/uL   Monocytes Absolute 0.6 0.1 - 0.9 x10E3/uL   EOS (ABSOLUTE) 0.5 (H) 0.0 - 0.4 x10E3/uL   Basophils Absolute 0.0 0.0 - 0.2 x10E3/uL   Immature Granulocytes 1 Not Estab. %   Immature Grans (Abs) 0.0 0.0 - 0.1 x10E3/uL  Comprehensive metabolic panel  Result Value Ref Range   Glucose 87 65 - 99 mg/dL   BUN 14 6 - 24 mg/dL   Creatinine, Ser 0.71 0.57 - 1.00 mg/dL   GFR calc non Af Amer 94 >59 mL/min/1.73   GFR calc Af Amer 108 >59 mL/min/1.73   BUN/Creatinine Ratio 20 9 - 23   Sodium 140 134 - 144 mmol/L   Potassium 4.1 3.5 - 5.2 mmol/L   Chloride 101 96 - 106 mmol/L   CO2 23 20 - 29 mmol/L   Calcium 9.8 8.7 - 10.2 mg/dL   Total Protein 6.5 6.0 - 8.5 g/dL   Albumin 4.5 3.5 - 5.5 g/dL   Globulin, Total 2.0 1.5 - 4.5 g/dL   Albumin/Globulin Ratio 2.3 (H) 1.2 - 2.2   Bilirubin Total 0.4 0.0 - 1.2 mg/dL    Alkaline Phosphatase 91 39 - 117 IU/L   AST 16 0 - 40 IU/L   ALT 15 0 - 32 IU/L  Lipid Panel w/o Chol/HDL Ratio  Result Value Ref Range   Cholesterol, Total 222 (H) 100 - 199 mg/dL   Triglycerides 474 (H) 0 - 149 mg/dL   HDL 31 (L) >39 mg/dL   VLDL Cholesterol Cal Comment 5 - 40 mg/dL   LDL Calculated Comment 0 - 99 mg/dL  VITAMIN D 25 Hydroxy (Vit-D Deficiency, Fractures)  Result Value Ref Range   Vit D, 25-Hydroxy 23.5 (L) 30.0 - 100.0 ng/mL  TSH  Result Value Ref Range   TSH 1.470 0.450 - 4.500 uIU/mL  IGP, Aptima HPV, rfx 16/18,45  Result Value Ref Range   DIAGNOSIS: Comment    Specimen adequacy: Comment    Clinician Provided ICD10 Comment    Performed by: Comment    PAP Smear Comment .    Note: Comment    Test Methodology Comment    HPV Aptima Negative Negative      Assessment & Plan:   Problem List Items Addressed This Visit      Unprioritized   Depression, major, single episode, severe ([place])    Improved with current medication.  Will recheck in 6 weeks.         Other Visit Diagnoses    Lipoma of torso    -  Primary   Per Dr. Fleet Contras.     High triglycerides       Relevant Orders   Lipid Panel w/o Chol/HDL Ratio       Follow up plan: Return in about 6 weeks (around 03/14/2018).

## 2018-03-01 ENCOUNTER — Other Ambulatory Visit: Payer: Self-pay | Admitting: Unknown Physician Specialty

## 2018-03-03 NOTE — Telephone Encounter (Signed)
sumatriptan refill Last Refill:01/25/18 # 8 No RF Last OV: 01/03/18 PCP: Kathrine Haddock NP [place]  zolmitriptan refill Last Refill:01/25/18 # 6 No RF Last OV: 01/03/18  Last 2 BP 01/03/18: 126/78 01/31/18: 116/78

## 2018-03-08 ENCOUNTER — Encounter: Payer: Self-pay | Admitting: General Surgery

## 2018-03-08 ENCOUNTER — Ambulatory Visit (INDEPENDENT_AMBULATORY_CARE_PROVIDER_SITE_OTHER): Payer: 59 | Admitting: General Surgery

## 2018-03-08 VITALS — BP 136/78 | HR 79 | Resp 14 | Ht 66.7 in | Wt 182.0 lb

## 2018-03-08 DIAGNOSIS — D171 Benign lipomatous neoplasm of skin and subcutaneous tissue of trunk: Secondary | ICD-10-CM

## 2018-03-08 NOTE — Patient Instructions (Addendum)
Patient  to use ice today.

## 2018-03-08 NOTE — Progress Notes (Signed)
Patient ID: Debra Cannon, female   DOB: December 07, 1955, 62 y.o.   MRN: [id number]  Chief Complaint  Patient presents with  . Procedure    HPI Debra Cannon is a 61 y.o. female here for scheduled removal of lipoma from her upper back. She reports that the area seems to be a little more tender.  HPI  Past Medical History:  Diagnosis Date  . Herpes   . Hypercholesteremia   . Menopause   . Migraine     Past Surgical History:  Procedure Laterality Date  . APPENDECTOMY    . COLONOSCOPY  09/03/2008   Dr Vira Agar  . REPAIR ANKLE LIGAMENT Left 2004 & 2005  . UPPER GI ENDOSCOPY  2009   Dr Vira Agar    Family History  Problem Relation Age of Onset  . Osteoporosis Mother   . Thyroid disease Mother   . Heart disease Father   . Cancer Sister        endometrial  . Cardiomyopathy Brother   . Cancer Maternal Grandmother        vulvar  . Diabetes Maternal Grandmother   . AAA (abdominal aortic aneurysm) Maternal Grandfather   . Stroke Paternal Grandmother   . Heart disease Paternal Grandfather   . Breast cancer Neg Hx     Social History Social History   Tobacco Use  . Smoking status: Former Smoker    Years: 30.00    Types: Cigarettes  . Smokeless tobacco: Never Used  Substance Use Topics  . Alcohol use: Yes    Alcohol/week: 0.0 oz    Comment: pt states very rare  . Drug use: No    No Known Allergies  Current Outpatient Medications  Medication Sig Dispense Refill  . acyclovir (ZOVIRAX) 400 MG tablet Take 400 mg by mouth as needed.    . B Complex Vitamins (VITAMIN B COMPLEX) TABS Take 1 tablet by mouth daily.    . Cholecalciferol (VITAMIN D PO) Take 500 Units by mouth daily.    . citalopram (CELEXA) 20 MG tablet Take 1 tablet (20 mg total) by mouth daily. 30 tablet 3  . fexofenadine-pseudoephedrine (ALLEGRA-D) 60-120 MG 12 hr tablet Take 1 tablet by mouth 2 (two) times daily. 20 tablet 0  . MAGNESIUM GLYCINATE PLUS PO Take 1 tablet by mouth as needed.    . SUMAtriptan  (IMITREX) 100 MG tablet TAKE 1 TABLET (100 MG TOTAL) BY MOUTH AS NEEDED. MAY REPEAT IN 2 HOURS IF HEADACHE PERSISTS OR RECURS. 8 tablet 0  . zolmitriptan (ZOMIG) 5 MG tablet TAKE 1 TABLET (5 MG TOTAL) BY MOUTH DAILY AS NEEDED. 6 tablet 0   No current facility-administered medications for this visit.     Review of Systems Review of Systems  Constitutional: Negative.   Respiratory: Negative.   Cardiovascular: Negative.     Blood pressure 136/78, pulse 79, resp. rate 14, height 5' 6.7" (1.694 m), weight 182 lb (82.6 kg).  Physical Exam Physical Exam  Constitutional: She is oriented to person, place, and time. She appears well-developed and well-nourished.  Neurological: She is alert and oriented to person, place, and time.  Skin: Skin is warm and dry.  Examination shows the previously identified large lipoma along the right posterior axillary line is previously identified.  This is approximately 4 x 6 cm.    Data Reviewed The procedure for excision was reviewed and the patient was amenable to proceed.  ChloraPrep was applied to the skin.  30 cc of 0.5% Xylocaine with  0.25% Marcaine with 1 to 200,000 units of epinephrine was utilized and well-tolerated.  After suitable interval, the area was reprepped with ChloraPrep and draped.  A skin line incision was made over the mass and carried down through the subcutaneous tissue.  Once the superficial fascia was opened the lipoma-like lesion was identified.  This extended down to and included the fascia overlying the latissimus muscle.  Using blunt and sharp dissection the entire lipoma was excised.  Scant bleeding was noted.  There is a little tenderness in the inferior aspect of the wound and this was managed with an additional total of 10 cc of 1% plain Xylocaine.  After the mass was excised the area was clear and no evidence of residual lipomatous-like material was noted.  The deep tissue was approximated with interrupted 2-0 Vicryl figure-of-eight  sutures.  The superficial fascia was approximated with a running 3-0 Vicryl suture.  Interrupted 3-0 Vicryl sutures were placed in the deep dermis.  A running 4-0 Vicryl subcuticular suture was used to close the skin.  Benzoin, Steri-Strips, Telfa and Tegaderm dressing applied.  Assessment    Lipoma of the right posterior chest wall.    Plan  Patient tolerated the procedure well.  She is been encouraged to use ice intermittently for the rest of the day.  Tylenol/Advil/Aleve: If needed for soreness.  We will plan for a wound check either here or if we see each other at the hospital next week.     Forest Gleason Byrnett 03/08/2018, 5:26 PM

## 2018-03-14 ENCOUNTER — Encounter: Payer: Self-pay | Admitting: Unknown Physician Specialty

## 2018-03-14 ENCOUNTER — Ambulatory Visit (INDEPENDENT_AMBULATORY_CARE_PROVIDER_SITE_OTHER): Payer: 59 | Admitting: Unknown Physician Specialty

## 2018-03-14 DIAGNOSIS — F325 Major depressive disorder, single episode, in full remission: Secondary | ICD-10-CM

## 2018-03-14 NOTE — Progress Notes (Signed)
BP 124/78   Pulse 73   Temp 98.6 F (37 C) (Oral)   Ht 5' 6.5" (1.689 m)   Wt 185 lb 12.8 oz (84.3 kg)   LMP  (LMP Unknown)   SpO2 96%   BMI 29.54 kg/m    Subjective:    Patient ID: Debra Cannon, female    DOB: March 19, 1956, 62 y.o.   MRN: [id number]  HPI: Debra Cannon is a 62 y.o. female  Chief Complaint  Patient presents with  . Depression    6 week f/up   Depression Pt is here for f/u of depression.  Pt was started on Citalopram over 2 months ago with partial improvement on 4/29.  Today she feels well.  Continuing to take Citalopram.  She is trying to eat well and beginning to exercise.  She is sleeping better Depression screen [facility] 2/9 03/14/2018 01/31/2018 01/03/2018 11/02/2016 09/09/2015  Decreased Interest 0 1 3 1 1   Down, Depressed, Hopeless 0 2 3 1 1   PHQ - 2 Score 0 3 6 2 2   Altered sleeping 0 1 2 3 1   Tired, decreased energy 1 1 3 2 1   Change in appetite 0 0 0 0 0  Feeling bad or failure about yourself  0 1 2 1  0  Trouble concentrating 0 1 2 0 0  Moving slowly or fidgety/restless 0 0 0 0 0  Suicidal thoughts 0 1 3 0 0  PHQ-9 Score 1 8 18 8 4   Difficult doing work/chores - - - - Somewhat difficult    Relevant past medical, surgical, family and social history reviewed and updated as indicated. Interim medical history since our last visit reviewed. Allergies and medications reviewed and updated.  Review of Systems  Per HPI unless specifically indicated above     Objective:    BP 124/78   Pulse 73   Temp 98.6 F (37 C) (Oral)   Ht 5' 6.5" (1.689 m)   Wt 185 lb 12.8 oz (84.3 kg)   LMP  (LMP Unknown)   SpO2 96%   BMI 29.54 kg/m   Wt Readings from Last 3 Encounters:  03/14/18 185 lb 12.8 oz (84.3 kg)  03/08/18 182 lb (82.6 kg)  01/31/18 188 lb 1.6 oz (85.3 kg)    Physical Exam  Constitutional: She is oriented to person, place, and time. She appears well-developed and well-nourished. No distress.  HENT:  Head: Normocephalic and atraumatic.  Eyes:  Conjunctivae and lids are normal. Right eye exhibits no discharge. Left eye exhibits no discharge. No scleral icterus.  Neck: Normal range of motion. Neck supple. No JVD present. Carotid bruit is not present.  Cardiovascular: Normal rate, regular rhythm and normal heart sounds.  Pulmonary/Chest: Effort normal and breath sounds normal.  Abdominal: Normal appearance. There is no splenomegaly or hepatomegaly.  Musculoskeletal: Normal range of motion.  Neurological: She is alert and oriented to person, place, and time.  Skin: Skin is warm, dry and intact. No rash noted. No pallor.  Psychiatric: She has a normal mood and affect. Her behavior is normal. Judgment and thought content normal.    Results for orders placed or performed in visit on 01/03/18  CBC with Differential/Platelet  Result Value Ref Range   WBC 8.8 3.4 - 10.8 x10E3/uL   RBC 4.55 3.77 - 5.28 x10E6/uL   Hemoglobin 14.2 11.1 - 15.9 g/dL   Hematocrit 41.2 34.0 - 46.6 %   MCV 91 79 - 97 fL   MCH 31.2 26.6 -  33.0 pg   MCHC 34.5 31.5 - 35.7 g/dL   RDW 12.5 12.3 - 15.4 %   Platelets 358 150 - 379 x10E3/uL   Neutrophils 56 Not Estab. %   Lymphs 31 Not Estab. %   Monocytes 7 Not Estab. %   Eos 5 Not Estab. %   Basos 0 Not Estab. %   Neutrophils Absolute 5.0 1.4 - 7.0 x10E3/uL   Lymphocytes Absolute 2.7 0.7 - 3.1 x10E3/uL   Monocytes Absolute 0.6 0.1 - 0.9 x10E3/uL   EOS (ABSOLUTE) 0.5 (H) 0.0 - 0.4 x10E3/uL   Basophils Absolute 0.0 0.0 - 0.2 x10E3/uL   Immature Granulocytes 1 Not Estab. %   Immature Grans (Abs) 0.0 0.0 - 0.1 x10E3/uL  Comprehensive metabolic panel  Result Value Ref Range   Glucose 87 65 - 99 mg/dL   BUN 14 6 - 24 mg/dL   Creatinine, Ser 0.71 0.57 - 1.00 mg/dL   GFR calc non Af Amer 94 >59 mL/min/1.73   GFR calc Af Amer 108 >59 mL/min/1.73   BUN/Creatinine Ratio 20 9 - 23   Sodium 140 134 - 144 mmol/L   Potassium 4.1 3.5 - 5.2 mmol/L   Chloride 101 96 - 106 mmol/L   CO2 23 20 - 29 mmol/L   Calcium 9.8  8.7 - 10.2 mg/dL   Total Protein 6.5 6.0 - 8.5 g/dL   Albumin 4.5 3.5 - 5.5 g/dL   Globulin, Total 2.0 1.5 - 4.5 g/dL   Albumin/Globulin Ratio 2.3 (H) 1.2 - 2.2   Bilirubin Total 0.4 0.0 - 1.2 mg/dL   Alkaline Phosphatase 91 39 - 117 IU/L   AST 16 0 - 40 IU/L   ALT 15 0 - 32 IU/L  Lipid Panel w/o Chol/HDL Ratio  Result Value Ref Range   Cholesterol, Total 222 (H) 100 - 199 mg/dL   Triglycerides 474 (H) 0 - 149 mg/dL   HDL 31 (L) >39 mg/dL   VLDL Cholesterol Cal Comment 5 - 40 mg/dL   LDL Calculated Comment 0 - 99 mg/dL  VITAMIN D 25 Hydroxy (Vit-D Deficiency, Fractures)  Result Value Ref Range   Vit D, 25-Hydroxy 23.5 (L) 30.0 - 100.0 ng/mL  TSH  Result Value Ref Range   TSH 1.470 0.450 - 4.500 uIU/mL  IGP, Aptima HPV, rfx 16/18,45  Result Value Ref Range   DIAGNOSIS: Comment    Specimen adequacy: Comment    Clinician Provided ICD10 Comment    Performed by: Comment    PAP Smear Comment .    Note: Comment    Test Methodology Comment    HPV Aptima Negative Negative      Assessment & Plan:   Problem List Items Addressed This Visit      Unprioritized   Major depression in full remission ([place])    Pt doing well with Citalopram 20 mg.  Will continue present medication.  Recheck in 6 months          Follow up plan: Return in about 6 months (around 09/13/2018).

## 2018-03-14 NOTE — Assessment & Plan Note (Signed)
Pt doing well with Citalopram 20 mg.  Will continue present medication.  Recheck in 6 months

## 2018-04-04 ENCOUNTER — Telehealth: Payer: Self-pay | Admitting: Family Medicine

## 2018-04-04 MED ORDER — CITALOPRAM HYDROBROMIDE 20 MG PO TABS: 20.0000 mg | ORAL_TABLET | Freq: Every day | ORAL | 0 refills | Status: DC

## 2018-04-04 NOTE — Telephone Encounter (Signed)
Looks like she's your patient right now [place].

## 2018-04-04 NOTE — Telephone Encounter (Signed)
Pt seen for CPE 01/03/2018  Pt next appt is 09/15/18 not 04/15/18

## 2018-04-04 NOTE — Telephone Encounter (Signed)
Copied from [place] [id number]. Topic: General - Other >> Apr 04, 2018  9:45 AM Oneta Rack wrote: Relation to pt: self Call back [place]: [place], [place] [phone] (Phone) [phone] (Fax)  Reason for call:  Patient would like PCP to refill all medications including citalopram (CELEXA) 20 MG tablet for 1 year or at least  until she sees Dr. Wynetta Emery on 04/15/18, please advise

## 2018-04-28 ENCOUNTER — Other Ambulatory Visit: Payer: Self-pay

## 2018-04-29 MED ORDER — SUMATRIPTAN SUCCINATE 100 MG PO TABS: 100.0000 mg | ORAL_TABLET | ORAL | 0 refills | Status: DC | PRN

## 2018-04-29 MED ORDER — ZOLMITRIPTAN 5 MG PO TABS: 5.0000 mg | ORAL_TABLET | Freq: Every day | ORAL | 0 refills | Status: DC | PRN

## 2018-07-19 DIAGNOSIS — D2261 Melanocytic nevi of right upper limb, including shoulder: Secondary | ICD-10-CM | POA: Diagnosis not present

## 2018-07-19 DIAGNOSIS — D2271 Melanocytic nevi of right lower limb, including hip: Secondary | ICD-10-CM | POA: Diagnosis not present

## 2018-07-19 DIAGNOSIS — L820 Inflamed seborrheic keratosis: Secondary | ICD-10-CM | POA: Diagnosis not present

## 2018-07-19 DIAGNOSIS — L821 Other seborrheic keratosis: Secondary | ICD-10-CM | POA: Diagnosis not present

## 2018-07-19 DIAGNOSIS — R208 Other disturbances of skin sensation: Secondary | ICD-10-CM | POA: Diagnosis not present

## 2018-07-19 DIAGNOSIS — D2272 Melanocytic nevi of left lower limb, including hip: Secondary | ICD-10-CM | POA: Diagnosis not present

## 2018-07-19 DIAGNOSIS — D2262 Melanocytic nevi of left upper limb, including shoulder: Secondary | ICD-10-CM | POA: Diagnosis not present

## 2018-07-19 DIAGNOSIS — L538 Other specified erythematous conditions: Secondary | ICD-10-CM | POA: Diagnosis not present

## 2018-07-19 DIAGNOSIS — D225 Melanocytic nevi of trunk: Secondary | ICD-10-CM | POA: Diagnosis not present

## 2018-07-19 DIAGNOSIS — D485 Neoplasm of uncertain behavior of skin: Secondary | ICD-10-CM | POA: Diagnosis not present

## 2018-07-19 DIAGNOSIS — B078 Other viral warts: Secondary | ICD-10-CM | POA: Diagnosis not present

## 2018-08-02 ENCOUNTER — Other Ambulatory Visit: Payer: Self-pay | Admitting: Unknown Physician Specialty

## 2018-08-02 DIAGNOSIS — Z1231 Encounter for screening mammogram for malignant neoplasm of breast: Secondary | ICD-10-CM

## 2018-08-23 ENCOUNTER — Other Ambulatory Visit: Payer: Self-pay | Admitting: Unknown Physician Specialty

## 2018-08-23 NOTE — Telephone Encounter (Signed)
Patient is calling and states she has a follow up on 09/15/18 with Dr. Wynetta Emery, patient states she does not have enough to last her to that appointment.

## 2018-08-30 ENCOUNTER — Ambulatory Visit
Admission: RE | Admit: 2018-08-30 | Discharge: 2018-08-30 | Disposition: A | Discharge status: Home / Self Care | Payer: 59 | Source: Ambulatory Visit | Attending: Unknown Physician Specialty | Admitting: Unknown Physician Specialty

## 2018-08-30 DIAGNOSIS — Z1231 Encounter for screening mammogram for malignant neoplasm of breast: Secondary | ICD-10-CM | POA: Diagnosis not present

## 2018-09-07 ENCOUNTER — Other Ambulatory Visit: Payer: Self-pay

## 2018-09-07 NOTE — Telephone Encounter (Signed)
Patient last seen 03/14/18 and has f/up 09/16/19 with Dr. Wynetta Emery.

## 2018-09-08 ENCOUNTER — Other Ambulatory Visit: Payer: Self-pay

## 2018-09-08 MED ORDER — ZOLMITRIPTAN 5 MG PO TABS: 5.0000 mg | ORAL_TABLET | Freq: Every day | ORAL | 0 refills | Status: DC | PRN

## 2018-09-08 NOTE — Telephone Encounter (Signed)
Copied from [place] [id number]. Topic: Quick Sport and exercise psychologist Patient (Clinic Use ONLY) >> Sep 08, 2018  2:39 PM Amada Kingfisher, CMA wrote: Called patient to see if she had enough migraine medication to last until upcoming appointment. >> Sep 08, 2018  2:42 PM Carolyn Stare wrote:   Pt return call and said yes she has enough medicine to last until her appt

## 2018-09-08 NOTE — Telephone Encounter (Signed)
Can you see if she has enough until I see her on 09/15/18

## 2018-09-08 NOTE — Telephone Encounter (Signed)
Left message on machine for pt to return call to the office.  

## 2018-09-15 ENCOUNTER — Other Ambulatory Visit: Payer: 59

## 2018-09-15 ENCOUNTER — Other Ambulatory Visit: Payer: Self-pay

## 2018-09-15 ENCOUNTER — Other Ambulatory Visit: Payer: Self-pay | Admitting: Unknown Physician Specialty

## 2018-09-15 ENCOUNTER — Ambulatory Visit: Payer: 59 | Admitting: Family Medicine

## 2018-09-15 ENCOUNTER — Encounter: Payer: Self-pay | Admitting: Family Medicine

## 2018-09-15 VITALS — BP 118/75 | HR 73 | Temp 97.6°F | Ht 68.0 in | Wt 181.4 lb

## 2018-09-15 DIAGNOSIS — E7800 Pure hypercholesterolemia, unspecified: Secondary | ICD-10-CM | POA: Diagnosis not present

## 2018-09-15 DIAGNOSIS — G43909 Migraine, unspecified, not intractable, without status migrainosus: Secondary | ICD-10-CM | POA: Diagnosis not present

## 2018-09-15 DIAGNOSIS — F325 Major depressive disorder, single episode, in full remission: Secondary | ICD-10-CM | POA: Diagnosis not present

## 2018-09-15 DIAGNOSIS — E781 Pure hyperglyceridemia: Secondary | ICD-10-CM

## 2018-09-15 MED ORDER — SUMATRIPTAN SUCCINATE 100 MG PO TABS: 100.0000 mg | ORAL_TABLET | ORAL | 12 refills | Status: DC | PRN

## 2018-09-15 MED ORDER — CITALOPRAM HYDROBROMIDE 40 MG PO TABS: 40.0000 mg | ORAL_TABLET | Freq: Every day | ORAL | 6 refills | Status: DC

## 2018-09-15 MED ORDER — ERENUMAB-AOOE 70 MG/ML ~~LOC~~ SOAJ: 70.0000 mg | SUBCUTANEOUS | 3 refills | Status: DC

## 2018-09-15 MED ORDER — ZOLMITRIPTAN 5 MG PO TABS: 5.0000 mg | ORAL_TABLET | Freq: Every day | ORAL | 12 refills | Status: DC | PRN

## 2018-09-15 NOTE — Progress Notes (Signed)
BP 118/75   Pulse 73   Temp 97.6 F (36.4 C) (Oral)   Ht 5\' 8"  (1.727 m)   Wt 181 lb 6.4 oz (82.3 kg)   LMP  (LMP Unknown)   SpO2 95%   BMI 27.58 kg/m    Subjective:    Patient ID: Debra Cannon, female    DOB: 11-07-1955, 62 y.o.   MRN: [id number]  HPI: Debra Cannon is a 62 y.o. female  Chief Complaint  Patient presents with  . Depression    f/u  . Medication Refill    imitrex  . Migraine   Migraine: Have been better than they were. She is still having migraines in clusters. She is still having 3-4 at a time, then 2-3 weeks without them. She has tried topamax and skelaxin in the past, propranol and amitriptyline without benefit. Has had them since she was 62 years old and is doing better with them, but still having several a month. Usually wakes up with them, so is not able to take her triptans in time. Has tried adjusting her diet. Has issues with the weather.   DEPRESSION- not doing as well as she was in the summer. Having some racing thoughts in the middle of the night. On the 20mg  of her celexa Mood status: exacerbated Satisfied with current treatment?: no Symptom severity: moderate  Duration of current treatment : chronic Side effects: no Medication compliance: excellent compliance Psychotherapy/counseling: no  Previous psychiatric medications: celexa Depressed mood: yes Anxious mood: yes Anhedonia: no Significant weight loss or gain: no Insomnia: yes hard to stay asleep Fatigue: yes Feelings of worthlessness or guilt: no Impaired concentration/indecisiveness: no Suicidal ideations: no Hopelessness: no Crying spells: no Depression screen [facility] 2/9 09/15/2018 03/14/2018 01/31/2018 01/03/2018 11/02/2016  Decreased Interest 1 0 1 3 1   Down, Depressed, Hopeless 1 0 2 3 1   PHQ - 2 Score 2 0 3 6 2   Altered sleeping 1 0 1 2 3   Tired, decreased energy 1 1 1 3 2   Change in appetite 0 0 0 0 0  Feeling bad or failure about yourself  1 0 1 2 1   Trouble concentrating 0 0 1  2 0  Moving slowly or fidgety/restless 0 0 0 0 0  Suicidal thoughts 0 0 1 3 0  PHQ-9 Score 5 1 8 18 8   Difficult doing work/chores Somewhat difficult - - - -   HYPERLIPIDEMIA Hyperlipidemia status: stable Satisfied with current treatment?  Not on anything Past cholesterol meds: none Supplements: none Aspirin:  no The 10-year ASCVD risk score [name], et al., 2013) is: 4.5%   Values used to calculate the score:     Age: 76 years     Sex: Female     Is Non-Hispanic African American: No     Diabetic: No     Tobacco smoker: No     Systolic Blood Pressure: 154 mmHg     Is BP treated: No     HDL Cholesterol: 31 mg/dL     Total Cholesterol: 222 mg/dL Chest pain:  no  Relevant past medical, surgical, family and social history reviewed and updated as indicated. Interim medical history since our last visit reviewed. Allergies and medications reviewed and updated.  Review of Systems  Per HPI unless specifically indicated above     Objective:    BP 118/75   Pulse 73   Temp 97.6 F (36.4 C) (Oral)   Ht 5\' 8"  (1.727 m)  Wt 181 lb 6.4 oz (82.3 kg)   LMP  (LMP Unknown)   SpO2 95%   BMI 27.58 kg/m   Wt Readings from Last 3 Encounters:  09/15/18 181 lb 6.4 oz (82.3 kg)  03/14/18 185 lb 12.8 oz (84.3 kg)  03/08/18 182 lb (82.6 kg)    Physical Exam  Results for orders placed or performed in visit on 01/03/18  CBC with Differential/Platelet  Result Value Ref Range   WBC 8.8 3.4 - 10.8 x10E3/uL   RBC 4.55 3.77 - 5.28 x10E6/uL   Hemoglobin 14.2 11.1 - 15.9 g/dL   Hematocrit 41.2 34.0 - 46.6 %   MCV 91 79 - 97 fL   MCH 31.2 26.6 - 33.0 pg   MCHC 34.5 31.5 - 35.7 g/dL   RDW 12.5 12.3 - 15.4 %   Platelets 358 150 - 379 x10E3/uL   Neutrophils 56 Not Estab. %   Lymphs 31 Not Estab. %   Monocytes 7 Not Estab. %   Eos 5 Not Estab. %   Basos 0 Not Estab. %   Neutrophils Absolute 5.0 1.4 - 7.0 x10E3/uL   Lymphocytes Absolute 2.7 0.7 - 3.1 x10E3/uL   Monocytes Absolute 0.6  0.1 - 0.9 x10E3/uL   EOS (ABSOLUTE) 0.5 (H) 0.0 - 0.4 x10E3/uL   Basophils Absolute 0.0 0.0 - 0.2 x10E3/uL   Immature Granulocytes 1 Not Estab. %   Immature Grans (Abs) 0.0 0.0 - 0.1 x10E3/uL  Comprehensive metabolic panel  Result Value Ref Range   Glucose 87 65 - 99 mg/dL   BUN 14 6 - 24 mg/dL   Creatinine, Ser 0.71 0.57 - 1.00 mg/dL   GFR calc non Af Amer 94 >59 mL/min/1.73   GFR calc Af Amer 108 >59 mL/min/1.73   BUN/Creatinine Ratio 20 9 - 23   Sodium 140 134 - 144 mmol/L   Potassium 4.1 3.5 - 5.2 mmol/L   Chloride 101 96 - 106 mmol/L   CO2 23 20 - 29 mmol/L   Calcium 9.8 8.7 - 10.2 mg/dL   Total Protein 6.5 6.0 - 8.5 g/dL   Albumin 4.5 3.5 - 5.5 g/dL   Globulin, Total 2.0 1.5 - 4.5 g/dL   Albumin/Globulin Ratio 2.3 (H) 1.2 - 2.2   Bilirubin Total 0.4 0.0 - 1.2 mg/dL   Alkaline Phosphatase 91 39 - 117 IU/L   AST 16 0 - 40 IU/L   ALT 15 0 - 32 IU/L  Lipid Panel w/o Chol/HDL Ratio  Result Value Ref Range   Cholesterol, Total 222 (H) 100 - 199 mg/dL   Triglycerides 474 (H) 0 - 149 mg/dL   HDL 31 (L) >39 mg/dL   VLDL Cholesterol Cal Comment 5 - 40 mg/dL   LDL Calculated Comment 0 - 99 mg/dL  VITAMIN D 25 Hydroxy (Vit-D Deficiency, Fractures)  Result Value Ref Range   Vit D, 25-Hydroxy 23.5 (L) 30.0 - 100.0 ng/mL  TSH  Result Value Ref Range   TSH 1.470 0.450 - 4.500 uIU/mL  IGP, Aptima HPV, rfx 16/18,45  Result Value Ref Range   DIAGNOSIS: Comment    Specimen adequacy: Comment    Clinician Provided ICD10 Comment    Performed by: Comment    PAP Smear Comment .    Note: Comment    Test Methodology Comment    HPV Aptima Negative Negative      Assessment & Plan:   Problem List Items Addressed This Visit      Cardiovascular and Mediastinum   Migraine  Still having 6-8 migraines a month, did not do well with topamax, skelaxin, propranolol or amitripityline.  Will start aimovig, 1st injection in the office today. Continue triptans PRN. Recheck 6-8 weeks.        Relevant Medications   zolmitriptan (ZOMIG) 5 MG tablet   SUMAtriptan (IMITREX) 100 MG tablet   citalopram (CELEXA) 40 MG tablet   Erenumab-aooe (AIMOVIG) 70 MG/ML SOAJ     Other   Hypercholesteremia    Had her cholesterol drawn this morning. Await results and treat as needed.       Major depression in full remission ([place]) - Primary    Under good control on current regimen. Continue current regimen. Continue to monitor. Call with any concerns. Refills given.        Relevant Medications   citalopram (CELEXA) 40 MG tablet       Follow up plan: Return 6-8 weeks.

## 2018-09-15 NOTE — Assessment & Plan Note (Signed)
Under good control on current regimen. Continue current regimen. Continue to monitor. Call with any concerns. Refills given.   

## 2018-09-15 NOTE — Assessment & Plan Note (Addendum)
Still having 6-8 migraines a month, did not do well with topamax, skelaxin, propranolol or amitripityline.  Will start aimovig, 1st injection in the office today. Continue triptans PRN. Recheck 6-8 weeks.

## 2018-09-15 NOTE — Assessment & Plan Note (Signed)
Had her cholesterol drawn this morning. Await results and treat as needed.

## 2018-09-16 LAB — LIPID PANEL W/O CHOL/HDL RATIO
Cholesterol, Total: 216 mg/dL — ABNORMAL HIGH (ref 100–199)
HDL: 38 mg/dL — ABNORMAL LOW (ref >39)
LDL CALC: 141 mg/dL — AB (ref 0–99)
TRIGLYCERIDES: 187 mg/dL — AB (ref 0–149)
VLDL Cholesterol Cal: 37 mg/dL (ref 5–40)

## 2018-09-19 ENCOUNTER — Telehealth: Payer: Self-pay

## 2018-09-19 NOTE — Telephone Encounter (Signed)
PA started for patient's Aimovig. Key: [id number]

## 2018-09-21 NOTE — Telephone Encounter (Signed)
PA approved, pharmacy notified.

## 2018-10-28 ENCOUNTER — Other Ambulatory Visit: Payer: Self-pay

## 2018-10-28 ENCOUNTER — Ambulatory Visit: Payer: 59 | Admitting: Family Medicine

## 2018-10-28 ENCOUNTER — Encounter: Payer: Self-pay | Admitting: Family Medicine

## 2018-10-28 VITALS — BP 131/82 | HR 68 | Temp 98.7°F | Ht 68.0 in | Wt 181.0 lb

## 2018-10-28 DIAGNOSIS — G43909 Migraine, unspecified, not intractable, without status migrainosus: Secondary | ICD-10-CM | POA: Diagnosis not present

## 2018-10-28 DIAGNOSIS — F325 Major depressive disorder, single episode, in full remission: Secondary | ICD-10-CM | POA: Diagnosis not present

## 2018-10-28 NOTE — Assessment & Plan Note (Signed)
Down to 4 migraines in the last month! Doing much better! Continue aimovig. Call with any concerns. Continue to monitor.

## 2018-10-28 NOTE — Assessment & Plan Note (Signed)
Doing much better on the 40mg  daily. Continue current regimen. Call with any concerns. Recheck at physical in 6 months.

## 2018-10-28 NOTE — Progress Notes (Signed)
BP 131/82   Pulse 68   Temp 98.7 F (37.1 C) (Oral)   Ht 5\' 8"  (1.727 m)   Wt 181 lb (82.1 kg)   LMP  (LMP Unknown)   SpO2 96%   BMI 27.52 kg/m    Subjective:    Patient ID: Debra Cannon, female    DOB: October 09, 1955, 63 y.o.   MRN: [id number]  HPI: Debra Cannon is a 63 y.o. female  Chief Complaint  Patient presents with  . Migraine    f/u   Likes the aimovig. Has been feeling well on it. Has had 4 migraines in the last month, only 1 bad one.   DEPRESSION Mood status: better Satisfied with current treatment?: yes Symptom severity: mild  Duration of current treatment : chronic Side effects: no Medication compliance: excellent compliance Psychotherapy/counseling: no  Previous psychiatric medications: celexa Depressed mood: no Anxious mood: yes Anhedonia: no Significant weight loss or gain: no Insomnia: no  Fatigue: yes Feelings of worthlessness or guilt: no Impaired concentration/indecisiveness: no Suicidal ideations: no Hopelessness: no Crying spells: no Depression screen [facility] 2/9 10/28/2018 09/15/2018 03/14/2018 01/31/2018 01/03/2018  Decreased Interest 0 1 0 1 3  Down, Depressed, Hopeless 1 1 0 2 3  PHQ - 2 Score 1 2 0 3 6  Altered sleeping 1 1 0 1 2  Tired, decreased energy 1 1 1 1 3   Change in appetite 0 0 0 0 0  Feeling bad or failure about yourself  1 1 0 1 2  Trouble concentrating 0 0 0 1 2  Moving slowly or fidgety/restless 0 0 0 0 0  Suicidal thoughts 0 0 0 1 3  PHQ-9 Score 4 5 1 8 18   Difficult doing work/chores Not difficult at all Somewhat difficult - - -   GAD 7 : Generalized Anxiety Score 10/28/2018  Nervous, Anxious, on Edge 1  Control/stop worrying 1  Worry too much - different things 1  Trouble relaxing 0  Restless 0  Easily annoyed or irritable 1  Afraid - awful might happen 0  Total GAD 7 Score 4  Anxiety Difficulty Somewhat difficult     Relevant past medical, surgical, family and social history reviewed and updated as indicated.  Interim medical history since our last visit reviewed. Allergies and medications reviewed and updated.  Review of Systems  Constitutional: Negative.   Respiratory: Negative.   Neurological: Positive for headaches. Negative for dizziness, tremors, seizures, syncope, facial asymmetry, speech difficulty, weakness, light-headedness and numbness.  Psychiatric/Behavioral: Negative.     Per HPI unless specifically indicated above     Objective:    BP 131/82   Pulse 68   Temp 98.7 F (37.1 C) (Oral)   Ht 5\' 8"  (1.727 m)   Wt 181 lb (82.1 kg)   LMP  (LMP Unknown)   SpO2 96%   BMI 27.52 kg/m   Wt Readings from Last 3 Encounters:  10/28/18 181 lb (82.1 kg)  09/15/18 181 lb 6.4 oz (82.3 kg)  03/14/18 185 lb 12.8 oz (84.3 kg)    Physical Exam Vitals signs and nursing note reviewed.  Constitutional:      General: She is not in acute distress.    Appearance: Normal appearance. She is not ill-appearing, toxic-appearing or diaphoretic.  HENT:     Head: Normocephalic and atraumatic.     Right Ear: External ear normal.     Left Ear: External ear normal.     Nose: Nose normal.  Mouth/Throat:     Mouth: Mucous membranes are moist.     Pharynx: Oropharynx is clear.  Eyes:     General: No scleral icterus.       Right eye: No discharge.        Left eye: No discharge.     Extraocular Movements: Extraocular movements intact.     Conjunctiva/sclera: Conjunctivae normal.     Pupils: Pupils are equal, round, and reactive to light.  Neck:     Musculoskeletal: Normal range of motion and neck supple.  Cardiovascular:     Rate and Rhythm: Normal rate and regular rhythm.     Pulses: Normal pulses.     Heart sounds: Normal heart sounds. No murmur. No friction rub. No gallop.   Pulmonary:     Effort: Pulmonary effort is normal. No respiratory distress.     Breath sounds: Normal breath sounds. No stridor. No wheezing, rhonchi or rales.  Chest:     Chest wall: No tenderness.    Musculoskeletal: Normal range of motion.  Skin:    General: Skin is warm and dry.     Capillary Refill: Capillary refill takes less than 2 seconds.     Coloration: Skin is not jaundiced or pale.     Findings: No bruising, erythema, lesion or rash.  Neurological:     General: No focal deficit present.     Mental Status: She is alert and oriented to person, place, and time. Mental status is at baseline.  Psychiatric:        Mood and Affect: Mood normal.        Behavior: Behavior normal.        Thought Content: Thought content normal.        Judgment: Judgment normal.     Results for orders placed or performed in visit on 09/15/18  Lipid Panel w/o Chol/HDL Ratio  Result Value Ref Range   Cholesterol, Total 216 (H) 100 - 199 mg/dL   Triglycerides 187 (H) 0 - 149 mg/dL   HDL 38 (L) >39 mg/dL   VLDL Cholesterol Cal 37 5 - 40 mg/dL   LDL Calculated 141 (H) 0 - 99 mg/dL      Assessment & Plan:   Problem List Items Addressed This Visit      Cardiovascular and Mediastinum   Migraine    Down to 4 migraines in the last month! Doing much better! Continue aimovig. Call with any concerns. Continue to monitor.         Other   Major depression in full remission ([place]) - Primary    Doing much better on the 40mg  daily. Continue current regimen. Call with any concerns. Recheck at physical in 6 months.           Follow up plan: Return in about 6 months (around 04/28/2019) for Physical.

## 2018-12-26 MED FILL — ZOLMitriptan 5 MG TABS: 5 | 30 days supply | Qty: 9 | Fill #0

## 2018-12-26 MED FILL — AIMOVIG 70 MG/ML SOAJ: 70 | 30 days supply | Qty: 1 | Fill #0

## 2018-12-26 MED FILL — SUMATRIPTAN SUCC 100 MG TAB: 100 | 30 days supply | Qty: 9 | Fill #0

## 2018-12-26 MED FILL — CITALOPRAM HBR 40 MG TABLET: 40 | 30 days supply | Qty: 30 | Fill #0

## 2019-01-23 ENCOUNTER — Other Ambulatory Visit: Payer: Self-pay | Admitting: Unknown Physician Specialty

## 2019-01-23 MED FILL — CITALOPRAM HBR 40 MG TABLET: 40 | 30 days supply | Qty: 30 | Fill #1 | Status: TO

## 2019-01-23 MED FILL — ZOLMitriptan 5 MG TABS: 5 | 30 days supply | Qty: 9 | Fill #1 | Status: TO

## 2019-01-23 MED FILL — AIMOVIG 70 MG/ML SOAJ: 70 | 30 days supply | Qty: 1 | Fill #1 | Status: TO

## 2019-01-23 MED FILL — ACYCLOVIR 400 MG TABLET: 400 | 30 days supply | Qty: 90 | Fill #0

## 2019-01-23 MED FILL — SUMATRIPTAN SUCC 100 MG TAB: 100 | 30 days supply | Qty: 9 | Fill #1 | Status: TO

## 2019-01-23 NOTE — Telephone Encounter (Signed)
Requested medication (s) are due for refill today: yes  Requested medication (s) are on the active medication list: yes  Last refill:  03/08/18  Future visit scheduled: yes  Notes to clinic:  Historical medication and provider   Requested Prescriptions  Pending Prescriptions Disp Refills   acyclovir (ZOVIRAX) 400 MG tablet [Pharmacy Med Name: ACYCLOVIR 400 MG TABLET 400 TAB] 90 tablet 2    Sig: TAKE 1 TABLET (400 MG TOTAL) BY MOUTH 3 (THREE) TIMES DAILY.     Antimicrobials:  Antiviral Agents - Anti-Herpetic Passed - 01/23/2019 10:20 AM      Passed - Valid encounter within last 12 months    Recent Outpatient Visits          2 months ago Major depressive disorder with single episode, in full remission ([place])   [place], Megan P, DO   4 months ago Major depressive disorder with single episode, in full remission ([place])   [place], Megan P, DO   10 months ago Major depressive disorder with single episode, in full remission ([place])   [place] Kathrine Haddock, NP   11 months ago Lipoma of torso   [facility] Kathrine Haddock, NP   1 year ago Annual physical exam   [facility] Kathrine Haddock, NP      Future Appointments            In 3 months Wynetta Emery, Barb Merino, DO [employer]

## 2019-03-07 DIAGNOSIS — M26601 Right temporomandibular joint disorder, unspecified: Secondary | ICD-10-CM | POA: Diagnosis not present

## 2019-03-10 ENCOUNTER — Telehealth: Payer: Self-pay

## 2019-03-10 NOTE — Telephone Encounter (Signed)
PA for Aimovig initiated and submitted via Cover My Meds. Key: [id number]

## 2019-03-14 NOTE — Telephone Encounter (Signed)
Approved for 12 fills from 03/13/19 -03/11/20  Reference number [id number]

## 2019-04-05 ENCOUNTER — Ambulatory Visit (INDEPENDENT_AMBULATORY_CARE_PROVIDER_SITE_OTHER): Payer: 59 | Admitting: Nurse Practitioner

## 2019-04-05 ENCOUNTER — Encounter: Payer: Self-pay | Admitting: Nurse Practitioner

## 2019-04-05 ENCOUNTER — Other Ambulatory Visit: Payer: Self-pay

## 2019-04-05 DIAGNOSIS — R21 Rash and other nonspecific skin eruption: Secondary | ICD-10-CM | POA: Diagnosis not present

## 2019-04-05 NOTE — Assessment & Plan Note (Signed)
Appears allergic in nature with individual welts.  Recommend taking daily Allegra and may take Benadryl as needed for pruritus or use Benadryl cream.  May use hydrocortisone cream to areas for comfort.  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  She will call office tomorrow if no improvement or worsening of rash present.  Return for worsening or continued symptoms.

## 2019-04-05 NOTE — Progress Notes (Signed)
BP (!) 158/90 Comment: pt reported  Pulse 67   Temp 99 F (37.2 C) (Oral) Comment: pt reported  LMP  (LMP Unknown)    Subjective:    Patient ID: Debra Cannon, female    DOB: 09-16-1956, 63 y.o.   MRN: [id number]  HPI: Debra Cannon is a 63 y.o. female  Chief Complaint  Patient presents with  . Rash    pt states she woke up witha rash this morning on her truck, abdomen, left wrist, back, and thighs. States it does itch and looked like hives this morning.     . This visit was completed via WebEx due to the restrictions of the COVID-19 pandemic. All issues as above were discussed and addressed. Physical exam was done as above through visual confirmation on WebEx. If it was felt that the patient should be evaluated in the office, they were directed there. The patient verbally consented to this visit. . Location of the patient: home . Location of the provider: work . Those involved with this call:  . Provider: Marnee Guarneri, DNP . CMA: Yvonna Alanis, CMA . Front Desk/Registration: Jill Side  . Time spent on call: 15 minutes with patient face to face via video conference. More than 50% of this time was spent in counseling and coordination of care. 10 minutes total spent in review of patient's record and preparation of their chart. I verified patient identity using two factors (patient name and date of birth). Patient consents verbally to being seen via telemedicine visit today.   RASH Works at [employer] at [employer], is an Therapist, sports.  Did not feel good, felt nausea and had headache and neck ache after leaving work yesterday.  Then woke-up this morning and noticed rash on torso, back, left breast, and both upper thighs.  States her other symptoms were no longer present this morning.  She is on Zovirax for herpes simplex, only takes it as needed for outbreaks.  Reports she did take a dose this morning "just in case", but states the rash does not look like shingles.  Does take Allegra every  day, but has not taken today.  Has hydrocortisone cream at home, but has not used this yet.  Reports the areas as individual "little red bumps" and the "look like hives". No recent gardening or hiking.  Denies URI symptoms, fever, SOB, or loss of taste.  Reports no recent exposure to bed bugs. Duration:  days  Location: generalized  Itching: yes Burning: no Redness: no Oozing: no Scaling: no Blisters: no Painful: no Fevers: no Change in detergents/soaps/personal care products: no Recent illness: no Recent travel:no History of same: no Context: stable Alleviating factors: nothing Treatments attempted:nothing Shortness of breath: no  Throat/tongue swelling: no Myalgias/arthralgias: no  Relevant past medical, surgical, family and social history reviewed and updated as indicated. Interim medical history since our last visit reviewed. Allergies and medications reviewed and updated.  Review of Systems  Constitutional: Negative for activity change, appetite change, diaphoresis, fatigue and fever.  HENT: Negative.   Respiratory: Negative for cough, chest tightness, shortness of breath and wheezing.   Cardiovascular: Negative for chest pain, palpitations and leg swelling.  Gastrointestinal: Negative for abdominal distention, abdominal pain, constipation, diarrhea, nausea and vomiting.  Skin: Positive for rash.  Neurological: Negative for dizziness, weakness and headaches.  Psychiatric/Behavioral: Negative.     Per HPI unless specifically indicated above     Objective:    BP (!) 158/90 Comment: pt reported  Pulse 67  Temp 99 F (37.2 C) (Oral) Comment: pt reported  LMP  (LMP Unknown)   Wt Readings from Last 3 Encounters:  10/28/18 181 lb (82.1 kg)  09/15/18 181 lb 6.4 oz (82.3 kg)  03/14/18 185 lb 12.8 oz (84.3 kg)    Physical Exam Vitals signs and nursing note reviewed.  Constitutional:      General: She is awake. She is not in acute distress.    Appearance: She is  well-developed. She is not ill-appearing.  HENT:     Head: Normocephalic.     Right Ear: Hearing normal.     Left Ear: Hearing normal.  Eyes:     General: Lids are normal.        Right eye: No discharge.        Left eye: No discharge.     Conjunctiva/sclera: Conjunctivae normal.  Neck:     Musculoskeletal: Normal range of motion.  Cardiovascular:     Comments: Unable to auscultate due to virtual exam only  Pulmonary:     Effort: Pulmonary effort is normal. No accessory muscle usage or respiratory distress.     Comments: Unable to auscultate due to virtual exam only  Abdominal:     Tenderness: There is no abdominal tenderness.     Comments: Unable to auscultate due to virtual exam only   Skin:    Findings: Rash present.     Comments: Via video viewed area under left breast to to torso.  Small, raised, individual welts noted.  Mild erythema present to areas with no vesicles or blisters.  No edema noted and she reports no tenderness.    Neurological:     Mental Status: She is alert and oriented to person, place, and time.  Psychiatric:        Attention and Perception: Attention normal.        Mood and Affect: Mood normal.        Behavior: Behavior normal. Behavior is cooperative.        Thought Content: Thought content normal.        Judgment: Judgment normal.     Results for orders placed or performed in visit on 09/15/18  Lipid Panel w/o Chol/HDL Ratio  Result Value Ref Range   Cholesterol, Total 216 (H) 100 - 199 mg/dL   Triglycerides 187 (H) 0 - 149 mg/dL   HDL 38 (L) >39 mg/dL   VLDL Cholesterol Cal 37 5 - 40 mg/dL   LDL Calculated 141 (H) 0 - 99 mg/dL      Assessment & Plan:   Problem List Items Addressed This Visit      Musculoskeletal and Integument   Rash    Appears allergic in nature with individual welts.  Recommend taking daily Allegra and may take Benadryl as needed for pruritus or use Benadryl cream.  May use hydrocortisone cream to areas for comfort.   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  She will call office tomorrow if no improvement or worsening of rash present.  Return for worsening or continued symptoms.         I discussed the assessment and treatment plan with the patient. The patient was provided an opportunity to ask questions and all were answered. The patient agreed with the plan and demonstrated an understanding of the instructions.   The patient was advised to call back or seek an in-person evaluation if the symptoms worsen or if the condition fails to  improve as anticipated.   I provided 15 minutes of time during this encounter.  Follow up plan: Return if symptoms worsen or fail to improve.

## 2019-04-05 NOTE — Patient Instructions (Signed)
Rash, Adult  A rash is a change in the color of your skin. A rash can also change the way your skin feels. There are many different conditions and factors that can cause a rash. Follow these instructions at home: The goal of treatment is to stop the itching and keep the rash from spreading. Watch for any changes in your symptoms. Let your doctor know about them. Follow these instructions to help with your condition: Medicine Take or apply over-the-counter and prescription medicines only as told by your doctor. These may include medicines:  To treat red or swollen skin (corticosteroid creams).  To treat itching.  To treat an allergy (oral antihistamines).  To treat very bad symptoms (oral corticosteroids).  Skin care  Put cool cloths (compresses) on the affected areas.  Do not scratch or rub your skin.  Avoid covering the rash. Make sure that the rash is exposed to air as much as possible. Managing itching and discomfort  Avoid hot showers or baths. These can make itching worse. A cold shower may help.  Try taking a bath with: ? Epsom salts. You can get these at your local pharmacy or grocery store. Follow the instructions on the package. ? Baking soda. Pour a small amount into the bath as told by your doctor. ? Colloidal oatmeal. You can get this at your local pharmacy or grocery store. Follow the instructions on the package.  Try putting baking soda paste onto your skin. Stir water into baking soda until it gets like a paste.  Try putting on a lotion that relieves itchiness (calamine lotion).  Keep cool and out of the sun. Sweating and being hot can make itching worse. General instructions   Rest as needed.  Drink enough fluid to keep your pee (urine) pale yellow.  Wear loose-fitting clothing.  Avoid scented soaps, detergents, and perfumes. Use gentle soaps, detergents, perfumes, and other cosmetic products.  Avoid anything that causes your rash. Keep a journal to  help track what causes your rash. Write down: ? What you eat. ? What cosmetic products you use. ? What you drink. ? What you wear. This includes jewelry.  Keep all follow-up visits as told by your doctor. This is important. Contact a doctor if:  You sweat at night.  You lose weight.  You pee (urinate) more than normal.  You pee less than normal, or you notice that your pee is a darker color than normal.  You feel weak.  You throw up (vomit).  Your skin or the whites of your eyes look yellow (jaundice).  Your skin: ? Tingles. ? Is numb.  Your rash: ? Does not go away after a few days. ? Gets worse.  You are: ? More thirsty than normal. ? More tired than normal.  You have: ? New symptoms. ? Pain in your belly (abdomen). ? A fever. ? Watery poop (diarrhea). Get help right away if:  You have a fever and your symptoms suddenly get worse.  You start to feel mixed up (confused).  You have a very bad headache or a stiff neck.  You have very bad joint pains or stiffness.  You have jerky movements that you cannot control (seizure).  Your rash covers all or most of your body. The rash may or may not be painful.  You have blisters that: ? Are on top of the rash. ? Grow larger. ? Grow together. ? Are painful. ? Are inside your nose or mouth.  You have a rash   that: ? Looks like purple pinprick-sized spots all over your body. ? Has a "bull's eye" or looks like a target. ? Is red and painful, causes your skin to peel, and is not from being in the sun too long. Summary  A rash is a change in the color of your skin. A rash can also change the way your skin feels.  The goal of treatment is to stop the itching and keep the rash from spreading.  Take or apply over-the-counter and prescription medicines only as told by your doctor.  Contact a doctor if you have new symptoms or symptoms that get worse.  Keep all follow-up visits as told by your doctor. This is  important. This information is not intended to replace advice given to you by your health care provider. Make sure you discuss any questions you have with your health care provider. Document Released: 03/09/2008 Document Revised: 01/13/2019 Document Reviewed: 04/25/2018 Elsevier Patient Education  2020 Elsevier Inc.  

## 2019-04-28 ENCOUNTER — Encounter: Payer: 59 | Admitting: Family Medicine

## 2019-05-08 ENCOUNTER — Other Ambulatory Visit: Payer: Self-pay | Admitting: Family Medicine

## 2019-05-08 NOTE — Telephone Encounter (Signed)
Requested Prescriptions  Pending Prescriptions Disp Refills  . citalopram (CELEXA) 40 MG tablet [Pharmacy Med Name: CITALOPRAM HBR 40 MG TABLET 40 TAB] 90 tablet 0    Sig: TAKE 1 TABLET BY MOUTH DAILY.     Psychiatry:  Antidepressants - SSRI Passed - 05/08/2019  8:45 AM      Passed - Valid encounter within last 6 months    Recent Outpatient Visits          1 month ago [place], [place], NP   6 months ago Major depressive disorder with single episode, in full remission ([place])   [place], Megan P, DO   7 months ago Major depressive disorder with single episode, in full remission ([place])   [place], Megan P, DO   1 year ago Major depressive disorder with single episode, in full remission ([place])   [place] Kathrine Haddock, NP   1 year ago Lipoma of torso   [facility] Kathrine Haddock, NP      Future Appointments            In 2 weeks Wyn[place] Emery, Megan P, DO [place]           Passed - Completed PHQ-2 or PHQ-9 in the last 360 days.

## 2019-05-25 ENCOUNTER — Other Ambulatory Visit: Payer: Self-pay

## 2019-05-25 ENCOUNTER — Encounter: Payer: Self-pay | Admitting: Family Medicine

## 2019-05-25 ENCOUNTER — Ambulatory Visit (INDEPENDENT_AMBULATORY_CARE_PROVIDER_SITE_OTHER): Payer: 59 | Admitting: Family Medicine

## 2019-05-25 VITALS — BP 136/85 | HR 61 | Temp 98.9°F | Ht 68.0 in | Wt 185.0 lb

## 2019-05-25 DIAGNOSIS — Z23 Encounter for immunization: Secondary | ICD-10-CM

## 2019-05-25 DIAGNOSIS — G43909 Migraine, unspecified, not intractable, without status migrainosus: Secondary | ICD-10-CM | POA: Diagnosis not present

## 2019-05-25 DIAGNOSIS — Z1159 Encounter for screening for other viral diseases: Secondary | ICD-10-CM

## 2019-05-25 DIAGNOSIS — Z0000 Encounter for general adult medical examination without abnormal findings: Secondary | ICD-10-CM | POA: Diagnosis not present

## 2019-05-25 DIAGNOSIS — E7800 Pure hypercholesterolemia, unspecified: Secondary | ICD-10-CM

## 2019-05-25 DIAGNOSIS — F325 Major depressive disorder, single episode, in full remission: Secondary | ICD-10-CM

## 2019-05-25 DIAGNOSIS — Z1211 Encounter for screening for malignant neoplasm of colon: Secondary | ICD-10-CM

## 2019-05-25 DIAGNOSIS — Z1239 Encounter for other screening for malignant neoplasm of breast: Secondary | ICD-10-CM

## 2019-05-25 DIAGNOSIS — Z114 Encounter for screening for human immunodeficiency virus [HIV]: Secondary | ICD-10-CM | POA: Diagnosis not present

## 2019-05-25 DIAGNOSIS — B009 Herpesviral infection, unspecified: Secondary | ICD-10-CM | POA: Diagnosis not present

## 2019-05-25 LAB — UA/M W/RFLX CULTURE, ROUTINE
Bilirubin, UA: NEGATIVE
Glucose, UA: NEGATIVE
Ketones, UA: NEGATIVE
Leukocytes,UA: NEGATIVE
Nitrite, UA: NEGATIVE
Protein,UA: NEGATIVE
RBC, UA: NEGATIVE
Specific Gravity, UA: <1.005 — ABNORMAL LOW (ref 1.005–1.030)
Urobilinogen, Ur: 0.2 mg/dL (ref 0.2–1.0)
pH, UA: 5 (ref 5.0–7.5)

## 2019-05-25 MED ORDER — FEXOFENADINE-PSEUDOEPHED ER 60-120 MG PO TB12: 1.0000 | ORAL_TABLET | Freq: Two times a day (BID) | ORAL | 0 refills | Status: DC

## 2019-05-25 MED ORDER — CYCLOBENZAPRINE HCL 10 MG PO TABS: 10.0000 mg | ORAL_TABLET | Freq: Every day | ORAL | 0 refills | Status: DC

## 2019-05-25 MED ORDER — AIMOVIG 70 MG/ML ~~LOC~~ SOAJ: 70.0000 mg | SUBCUTANEOUS | 3 refills | Status: DC

## 2019-05-25 MED ORDER — SUMATRIPTAN SUCCINATE 100 MG PO TABS: 100.0000 mg | ORAL_TABLET | ORAL | 12 refills | Status: DC | PRN

## 2019-05-25 MED ORDER — ACYCLOVIR 400 MG PO TABS: ORAL_TABLET | ORAL | 2 refills | Status: DC

## 2019-05-25 MED ORDER — CITALOPRAM HYDROBROMIDE 40 MG PO TABS: 60.0000 mg | ORAL_TABLET | Freq: Every day | ORAL | 1 refills | Status: DC

## 2019-05-25 MED ORDER — ZOLMITRIPTAN 5 MG PO TABS: 5.0000 mg | ORAL_TABLET | Freq: Every day | ORAL | 12 refills | Status: DC | PRN

## 2019-05-25 NOTE — Patient Instructions (Signed)
Health Maintenance, Female Adopting a healthy lifestyle and getting preventive care are important in promoting health and wellness. Ask your health care provider about:  The right schedule for you to have regular tests and exams.  Things you can do on your own to prevent diseases and keep yourself healthy. What should I know about diet, weight, and exercise? Eat a healthy diet   Eat a diet that includes plenty of vegetables, fruits, low-fat dairy products, and lean protein.  Do not eat a lot of foods that are high in solid fats, added sugars, or sodium. Maintain a healthy weight Body mass index (BMI) is used to identify weight problems. It estimates body fat based on height and weight. Your health care provider can help determine your BMI and help you achieve or maintain a healthy weight. Get regular exercise Get regular exercise. This is one of the most important things you can do for your health. Most adults should:  Exercise for at least 150 minutes each week. The exercise should increase your heart rate and make you sweat (moderate-intensity exercise).  Do strengthening exercises at least twice a week. This is in addition to the moderate-intensity exercise.  Spend less time sitting. Even light physical activity can be beneficial. Watch cholesterol and blood lipids Have your blood tested for lipids and cholesterol at 61 years of age, then have this test every 5 years. Have your cholesterol levels checked more often if:  Your lipid or cholesterol levels are high.  You are older than 61 years of age.  You are at high risk for heart disease. What should I know about cancer screening? Depending on your health history and family history, you may need to have cancer screening at various ages. This may include screening for:  Breast cancer.  Cervical cancer.  Colorectal cancer.  Skin cancer.  Lung cancer. What should I know about heart disease, diabetes, and high blood  pressure? Blood pressure and heart disease  High blood pressure causes heart disease and increases the risk of stroke. This is more likely to develop in people who have high blood pressure readings, are of African descent, or are overweight.  Have your blood pressure checked: ? Every 3-5 years if you are 18-39 years of age. ? Every year if you are 40 years old or older. Diabetes Have regular diabetes screenings. This checks your fasting blood sugar level. Have the screening done:  Once every three years after age 40 if you are at a normal weight and have a low risk for diabetes.  More often and at a younger age if you are overweight or have a high risk for diabetes. What should I know about preventing infection? Hepatitis B If you have a higher risk for hepatitis B, you should be screened for this virus. Talk with your health care provider to find out if you are at risk for hepatitis B infection. Hepatitis C Testing is recommended for:  Everyone born from 1945 through 1965.  Anyone with known risk factors for hepatitis C. Sexually transmitted infections (STIs)  Get screened for STIs, including gonorrhea and chlamydia, if: ? You are sexually active and are younger than 61 years of age. ? You are older than 61 years of age and your health care provider tells you that you are at risk for this type of infection. ? Your sexual activity has changed since you were last screened, and you are at increased risk for chlamydia or gonorrhea. Ask your health care provider if   you are at risk.  Ask your health care provider about whether you are at high risk for HIV. Your health care provider may recommend a prescription medicine to help prevent HIV infection. If you choose to take medicine to prevent HIV, you should first get tested for HIV. You should then be tested every 3 months for as long as you are taking the medicine. Pregnancy  If you are about to stop having your period (premenopausal) and  you may become pregnant, seek counseling before you get pregnant.  Take 400 to 800 micrograms (mcg) of folic acid every day if you become pregnant.  Ask for birth control (contraception) if you want to prevent pregnancy. Osteoporosis and menopause Osteoporosis is a disease in which the bones lose minerals and strength with aging. This can result in bone fractures. If you are 65 years old or older, or if you are at risk for osteoporosis and fractures, ask your health care provider if you should:  Be screened for bone loss.  Take a calcium or vitamin D supplement to lower your risk of fractures.  Be given hormone replacement therapy (HRT) to treat symptoms of menopause. Follow these instructions at home: Lifestyle  Do not use any products that contain nicotine or tobacco, such as cigarettes, e-cigarettes, and chewing tobacco. If you need help quitting, ask your health care provider.  Do not use street drugs.  Do not share needles.  Ask your health care provider for help if you need support or information about quitting drugs. Alcohol use  Do not drink alcohol if: ? Your health care provider tells you not to drink. ? You are pregnant, may be pregnant, or are planning to become pregnant.  If you drink alcohol: ? Limit how much you use to 0-1 drink a day. ? Limit intake if you are breastfeeding.  Be aware of how much alcohol is in your drink. In the [place] one drink equals one 12 oz bottle of beer (355 mL), one 5 oz glass of wine (148 mL), or one 1 oz glass of hard liquor (44 mL). General instructions  Schedule regular health, dental, and eye exams.  Stay current with your vaccines.  Tell your health care provider if: ? You often feel depressed. ? You have ever been abused or do not feel safe at home. Summary  Adopting a healthy lifestyle and getting preventive care are important in promoting health and wellness.  Follow your health care provider's instructions about healthy  diet, exercising, and getting tested or screened for diseases.  Follow your health care provider's instructions on monitoring your cholesterol and blood pressure. This information is not intended to replace advice given to you by your health care provider. Make sure you discuss any questions you have with your health care provider. Document Released: 04/06/2011 Document Revised: 09/14/2018 Document Reviewed: 09/14/2018 Elsevier Patient Education  2020 [employer] Inc.  

## 2019-05-25 NOTE — Progress Notes (Signed)
BP 136/85   Pulse 61   Temp 98.9 F (37.2 C) (Oral)   Ht 5\' 8"  (1.727 m)   Wt 185 lb (83.9 kg)   LMP  (LMP Unknown)   SpO2 96%   BMI 28.13 kg/m    Subjective:    Patient ID: Debra Cannon, female    DOB: 02-11-1956, 63 y.o.   MRN: [id number]  HPI: Debra Cannon is a 63 y.o. female presenting on 05/25/2019 for comprehensive medical examination. Current medical complaints include:  DEPRESSION Mood status: exacerbated Satisfied with current treatment?: yes Symptom severity: moderate  Duration of current treatment : chronic Side effects: no Medication compliance: excellent compliance Psychotherapy/counseling: no  Previous psychiatric medications: celexa Depressed mood: yes Anxious mood: yes Anhedonia: no Significant weight loss or gain: no Insomnia: no  Fatigue: yes Feelings of worthlessness or guilt: yes Impaired concentration/indecisiveness: yes Suicidal ideations: no Hopelessness: no Crying spells: yes Depression screen [facility] 2/9 05/25/2019 10/28/2018 09/15/2018 03/14/2018 01/31/2018  Decreased Interest 1 0 1 0 1  Down, Depressed, Hopeless 1 1 1  0 2  PHQ - 2 Score 2 1 2  0 3  Altered sleeping 1 1 1  0 1  Tired, decreased energy 1 1 1 1 1   Change in appetite 0 0 0 0 0  Feeling bad or failure about yourself  1 1 1  0 1  Trouble concentrating 2 0 0 0 1  Moving slowly or fidgety/restless 0 0 0 0 0  Suicidal thoughts 0 0 0 0 1  PHQ-9 Score 7 4 5 1 8   Difficult doing work/chores Very difficult Not difficult at all Somewhat difficult - -   HYPERLIPIDEMIA Hyperlipidemia status: stable Satisfied with current treatment?  Not on anything Past cholesterol meds: none Supplements: none Aspirin:  no The 10-year ASCVD risk score [name], et al., 2013) is: 6.1%   Values used to calculate the score:     Age: 75 years     Sex: Female     Is Non-Hispanic African American: No     Diabetic: No     Tobacco smoker: No     Systolic Blood Pressure: 812 mmHg     Is BP treated: No    HDL Cholesterol: 38 mg/dL     Total Cholesterol: 251 mg/dL Chest pain:  no Coronary artery disease:  no  Migraines- Doing much better. Doing well on the aimovig. No side effects. Notes that she had 3 in July, but usually averaging about 1/moth.   Menopausal Symptoms: no  Depression Screen done today and results listed below:  Depression screen [facility] 2/9 05/25/2019 10/28/2018 09/15/2018 03/14/2018 01/31/2018  Decreased Interest 1 0 1 0 1  Down, Depressed, Hopeless 1 1 1  0 2  PHQ - 2 Score 2 1 2  0 3  Altered sleeping 1 1 1  0 1  Tired, decreased energy 1 1 1 1 1   Change in appetite 0 0 0 0 0  Feeling bad or failure about yourself  1 1 1  0 1  Trouble concentrating 2 0 0 0 1  Moving slowly or fidgety/restless 0 0 0 0 0  Suicidal thoughts 0 0 0 0 1  PHQ-9 Score 7 4 5 1 8   Difficult doing work/chores Very difficult Not difficult at all Somewhat difficult - -    Past Medical History:  Past Medical History:  Diagnosis Date  . Herpes   . Hypercholesteremia   . Menopause   . Migraine     Surgical History:  Past Surgical History:  Procedure Laterality Date  . APPENDECTOMY    . BREAST EXCISIONAL BIOPSY Right 2019   lipoma  . COLONOSCOPY  09/03/2008   Dr Vira Agar  . REPAIR ANKLE LIGAMENT Left 2004 & 2005  . UPPER GI ENDOSCOPY  2009   Dr Vira Agar    Medications:  Current Outpatient Medications on File Prior to Visit  Medication Sig  . B Complex Vitamins (VITAMIN B COMPLEX) TABS Take 1 tablet by mouth daily.  . Cholecalciferol (VITAMIN D PO) Take 1,000 Units by mouth 2 (two) times daily. Total 2000 daily   No current facility-administered medications on file prior to visit.     Allergies:  No Known Allergies  Social History:  Social History   Socioeconomic History  . Marital status: Married    Spouse name: Not on file  . Number of children: Not on file  . Years of education: Not on file  . Highest education level: Not on file  Occupational History  . Not on file   Social Needs  . Financial resource strain: Not on file  . Food insecurity    Worry: Not on file    Inability: Not on file  . Transportation needs    Medical: Not on file    Non-medical: Not on file  Tobacco Use  . Smoking status: Former Smoker    Years: 30.00    Types: Cigarettes  . Smokeless tobacco: Never Used  Substance and Sexual Activity  . Alcohol use: Yes    Alcohol/week: 0.0 standard drinks    Comment: pt states very rare  . Drug use: No  . Sexual activity: Yes  Lifestyle  . Physical activity    Days per week: Not on file    Minutes per session: Not on file  . Stress: Not on file  Relationships  . Social Herbalist on phone: Not on file    Gets together: Not on file    Attends religious service: Not on file    Active member of club or organization: Not on file    Attends meetings of clubs or organizations: Not on file    Relationship status: Not on file  . Intimate partner violence    Fear of current or ex partner: Not on file    Emotionally abused: Not on file    Physically abused: Not on file    Forced sexual activity: Not on file  Other Topics Concern  . Not on file  Social History Narrative  . Not on file   Social History   Tobacco Use  Smoking Status Former Smoker  . Years: 30.00  . Types: Cigarettes  Smokeless Tobacco Never Used   Social History   Substance and Sexual Activity  Alcohol Use Yes  . Alcohol/week: 0.0 standard drinks   Comment: pt states very rare    Family History:  Family History  Problem Relation Age of Onset  . Osteoporosis Mother   . Thyroid disease Mother   . Heart disease Father   . Cancer Sister        endometrial  . Cardiomyopathy Brother   . Cancer Maternal Grandmother        vulvar  . Diabetes Maternal Grandmother   . AAA (abdominal aortic aneurysm) Maternal Grandfather   . Stroke Paternal Grandmother   . Heart disease Paternal Grandfather   . Breast cancer Neg Hx     Past medical history,  surgical history, medications, allergies, family history and  social history reviewed with patient today and changes made to appropriate areas of the chart.   Review of Systems  Constitutional: Negative.   HENT: Negative.   Eyes: Negative.   Respiratory: Negative.   Cardiovascular: Negative.   Gastrointestinal: Negative.  Negative for abdominal pain, blood in stool, constipation, diarrhea, heartburn, melena, nausea and vomiting.  Genitourinary: Negative.   Musculoskeletal: Negative.        Jaw pain on the R  Skin: Positive for rash (gone now). Negative for itching.  Neurological: Positive for headaches. Negative for dizziness, tingling, tremors, sensory change, speech change, focal weakness, seizures, loss of consciousness and weakness.  Endo/Heme/Allergies: Negative for environmental allergies and polydipsia. Does not bruise/bleed easily.  Psychiatric/Behavioral: Positive for depression. Negative for hallucinations, memory loss, substance abuse and suicidal ideas. The patient is nervous/anxious. The patient does not have insomnia.     All other ROS negative except what is listed above and in the HPI.      Objective:    BP 136/85   Pulse 61   Temp 98.9 F (37.2 C) (Oral)   Ht 5\' 8"  (1.727 m)   Wt 185 lb (83.9 kg)   LMP  (LMP Unknown)   SpO2 96%   BMI 28.13 kg/m   Wt Readings from Last 3 Encounters:  05/25/19 185 lb (83.9 kg)  10/28/18 181 lb (82.1 kg)  09/15/18 181 lb 6.4 oz (82.3 kg)    Physical Exam Vitals signs and nursing note reviewed.  Constitutional:      General: She is not in acute distress.    Appearance: Normal appearance. She is not ill-appearing, toxic-appearing or diaphoretic.  HENT:     Head: Normocephalic and atraumatic.     Right Ear: Tympanic membrane, ear canal and external ear normal. There is no impacted cerumen.     Left Ear: Tympanic membrane, ear canal and external ear normal. There is no impacted cerumen.     Nose: Nose normal. No congestion or  rhinorrhea.     Mouth/Throat:     Mouth: Mucous membranes are moist.     Pharynx: Oropharynx is clear. No oropharyngeal exudate or posterior oropharyngeal erythema.  Eyes:     General: No scleral icterus.       Right eye: No discharge.        Left eye: No discharge.     Extraocular Movements: Extraocular movements intact.     Conjunctiva/sclera: Conjunctivae normal.     Pupils: Pupils are equal, round, and reactive to light.  Neck:     Musculoskeletal: Normal range of motion and neck supple. No neck rigidity or muscular tenderness.     Vascular: No carotid bruit.  Cardiovascular:     Rate and Rhythm: Normal rate and regular rhythm.     Pulses: Normal pulses.     Heart sounds: No murmur. No friction rub. No gallop.   Pulmonary:     Effort: Pulmonary effort is normal. No respiratory distress.     Breath sounds: Normal breath sounds. No stridor. No wheezing, rhonchi or rales.  Chest:     Chest wall: No tenderness.  Abdominal:     General: Abdomen is flat. Bowel sounds are normal. There is no distension.     Palpations: Abdomen is soft. There is no mass.     Tenderness: There is no abdominal tenderness. There is no right CVA tenderness, left CVA tenderness, guarding or rebound.     Hernia: No hernia is present.  Genitourinary:    Comments:  Breast and pelvic exams deferred with shared decision making Musculoskeletal:        General: No swelling, tenderness, deformity or signs of injury.     Right lower leg: No edema.     Left lower leg: No edema.  Lymphadenopathy:     Cervical: No cervical adenopathy.  Skin:    General: Skin is warm and dry.     Capillary Refill: Capillary refill takes less than 2 seconds.     Coloration: Skin is not jaundiced or pale.     Findings: No bruising, erythema, lesion or rash.  Neurological:     General: No focal deficit present.     Mental Status: She is alert and oriented to person, place, and time. Mental status is at baseline.     Cranial Nerves:  No cranial nerve deficit.     Sensory: No sensory deficit.     Motor: No weakness.     Coordination: Coordination normal.     Gait: Gait normal.     Deep Tendon Reflexes: Reflexes normal.  Psychiatric:        Mood and Affect: Mood normal.        Behavior: Behavior normal.        Thought Content: Thought content normal.        Judgment: Judgment normal.     Results for orders placed or performed in visit on 05/25/19  HIV Antibody (routine testing w rflx)  Result Value Ref Range   HIV Screen 4th Generation wRfx Non Reactive Non Reactive  Hepatitis C antibody  Result Value Ref Range   Hep C Virus Ab <0.1 0.0 - 0.9 s/co ratio  CBC with Differential/Platelet  Result Value Ref Range   WBC 8.2 3.4 - 10.8 x10E3/uL   RBC 4.52 3.77 - 5.28 x10E6/uL   Hemoglobin 14.1 11.1 - 15.9 g/dL   Hematocrit 40.7 34.0 - 46.6 %   MCV 90 79 - 97 fL   MCH 31.2 26.6 - 33.0 pg   MCHC 34.6 31.5 - 35.7 g/dL   RDW 11.6 (L) 11.7 - 15.4 %   Platelets 374 150 - 450 x10E3/uL   Neutrophils 59 Not Estab. %   Lymphs 30 Not Estab. %   Monocytes 8 Not Estab. %   Eos 3 Not Estab. %   Basos 0 Not Estab. %   Neutrophils Absolute 4.9 1.4 - 7.0 x10E3/uL   Lymphocytes Absolute 2.5 0.7 - 3.1 x10E3/uL   Monocytes Absolute 0.6 0.1 - 0.9 x10E3/uL   EOS (ABSOLUTE) 0.2 0.0 - 0.4 x10E3/uL   Basophils Absolute 0.0 0.0 - 0.2 x10E3/uL   Immature Granulocytes 0 Not Estab. %   Immature Grans (Abs) 0.0 0.0 - 0.1 x10E3/uL  Comprehensive metabolic panel  Result Value Ref Range   Glucose 82 65 - 99 mg/dL   BUN 12 8 - 27 mg/dL   Creatinine, Ser 0.80 0.57 - 1.00 mg/dL   GFR calc non Af Amer 80 >59 mL/min/1.73   GFR calc Af Amer 92 >59 mL/min/1.73   BUN/Creatinine Ratio 15 12 - 28   Sodium 140 134 - 144 mmol/L   Potassium 4.2 3.5 - 5.2 mmol/L   Chloride 100 96 - 106 mmol/L   CO2 26 20 - 29 mmol/L   Calcium 9.5 8.7 - 10.3 mg/dL   Total Protein 6.6 6.0 - 8.5 g/dL   Albumin 4.6 3.8 - 4.8 g/dL   Globulin, Total 2.0 1.5 - 4.5  g/dL   Albumin/Globulin Ratio 2.3 (H) 1.2 -  2.2   Bilirubin Total 0.5 0.0 - 1.2 mg/dL   Alkaline Phosphatase 94 39 - 117 IU/L   AST 21 0 - 40 IU/L   ALT 16 0 - 32 IU/L  Lipid Panel w/o Chol/HDL Ratio  Result Value Ref Range   Cholesterol, Total 251 (H) 100 - 199 mg/dL   Triglycerides 303 (H) 0 - 149 mg/dL   HDL 38 (L) >39 mg/dL   VLDL Cholesterol Cal 61 (H) 5 - 40 mg/dL   LDL Calculated 152 (H) 0 - 99 mg/dL  TSH  Result Value Ref Range   TSH 1.660 0.450 - 4.500 uIU/mL  UA/M w/rflx Culture, Routine   Specimen: Urine   URINE  Result Value Ref Range   Specific Gravity, UA <1.005 (L) 1.005 - 1.030   pH, UA 5.0 5.0 - 7.5   Color, UA Yellow Yellow   Appearance Ur Clear Clear   Leukocytes,UA Negative Negative   Protein,UA Negative Negative/Trace   Glucose, UA Negative Negative   Ketones, UA Negative Negative   RBC, UA Negative Negative   Bilirubin, UA Negative Negative   Urobilinogen, Ur 0.2 0.2 - 1.0 mg/dL   Nitrite, UA Negative Negative      Assessment & Plan:   Problem List Items Addressed This Visit      Cardiovascular and Mediastinum   Migraine    Doing well. Continue current regimen. Refills given. Call with any concerns. Continue to monitor.       Relevant Medications   citalopram (CELEXA) 40 MG tablet   Erenumab-aooe (AIMOVIG) 70 MG/ML SOAJ   SUMAtriptan (IMITREX) 100 MG tablet   zolmitriptan (ZOMIG) 5 MG tablet   cyclobenzaprine (FLEXERIL) 10 MG tablet     Other   Herpes simplex    Under good control on current regimen. Continue current regimen. Continue to monitor. Call with any concerns. Refills given.        Relevant Medications   acyclovir (ZOVIRAX) 400 MG tablet   Hypercholesteremia    Labs drawn today. Await results. Treat as needed.       Major depression in full remission ([place])    Anxiety is acting up a bit with the pandemic. We will increase her celexa to 60mg  daily. Call if not helping. Continue to monitor. Call with any concerns. Rx given  today.      Relevant Medications   citalopram (CELEXA) 40 MG tablet    Other Visit Diagnoses    Routine general medical examination at a health care facility    -  Primary   Vaccines up to date. Screening labs checked today. Referral to GI made today. Mammogram ordered. Pap up to date. continue diet and exercise. Call with concerns.   Relevant Orders   CBC with Differential/Platelet (Completed)   Comprehensive metabolic panel (Completed)   Lipid Panel w/o Chol/HDL Ratio (Completed)   TSH (Completed)   UA/M w/rflx Culture, Routine (Completed)   Colon cancer screening       Referral to GI made today.   Relevant Orders   Ambulatory referral to Gastroenterology   Encounter for screening for HIV       Labs drawn today.   Relevant Orders   HIV Antibody (routine testing w rflx) (Completed)   Need for hepatitis C screening test       Labs drawn today.   Relevant Orders   Hepatitis C antibody (Completed)   Screening for breast cancer       Mammogram ordered today.   Relevant Orders  MM DIGITAL SCREENING BILATERAL       Follow up plan: Return in about 6 months (around 11/25/2019) for follow up.   LABORATORY TESTING:  - Pap smear: up to date  IMMUNIZATIONS:   - Tdap: Tetanus vaccination status reviewed: last tetanus booster within 10 years. - Influenza: Administered today - Pneumovax: Not applicable - Prevnar: Not applicable  SCREENING: -Mammogram: Ordered today  - Colonoscopy: Ordered today  - Bone Density: Not applicable   PATIENT COUNSELING:   Advised to take 1 mg of folate supplement per day if capable of pregnancy.   Sexuality: Discussed sexually transmitted diseases, partner selection, use of condoms, avoidance of unintended pregnancy  and contraceptive alternatives.   Advised to avoid cigarette smoking.  I discussed with the patient that most people either abstain from alcohol or drink within safe limits (<=14/week and <=4 drinks/occasion for males, <=7/weeks  and <= 3 drinks/occasion for females) and that the risk for alcohol disorders and other health effects rises proportionally with the number of drinks per week and how often a drinker exceeds daily limits.  Discussed cessation/primary prevention of drug use and availability of treatment for abuse.   Diet: Encouraged to adjust caloric intake to maintain  or achieve ideal body weight, to reduce intake of dietary saturated fat and total fat, to limit sodium intake by avoiding high sodium foods and not adding table salt, and to maintain adequate dietary potassium and calcium preferably from fresh fruits, vegetables, and low-fat dairy products.    stressed the importance of regular exercise  Injury prevention: Discussed safety belts, safety helmets, smoke detector, smoking near bedding or upholstery.   Dental health: Discussed importance of regular tooth brushing, flossing, and dental visits.    NEXT PREVENTATIVE PHYSICAL DUE IN 1 YEAR. Return in about 6 months (around 11/25/2019) for follow up.

## 2019-05-26 ENCOUNTER — Telehealth: Payer: Self-pay

## 2019-05-26 LAB — COMPREHENSIVE METABOLIC PANEL
ALT: 16 IU/L (ref 0–32)
AST: 21 IU/L (ref 0–40)
Albumin/Globulin Ratio: 2.3 — ABNORMAL HIGH (ref 1.2–2.2)
Albumin: 4.6 g/dL (ref 3.8–4.8)
Alkaline Phosphatase: 94 IU/L (ref 39–117)
BUN/Creatinine Ratio: 15 (ref 12–28)
BUN: 12 mg/dL (ref 8–27)
Bilirubin Total: 0.5 mg/dL (ref 0.0–1.2)
CO2: 26 mmol/L (ref 20–29)
Calcium: 9.5 mg/dL (ref 8.7–10.3)
Chloride: 100 mmol/L (ref 96–106)
Creatinine, Ser: 0.8 mg/dL (ref 0.57–1.00)
GFR calc Af Amer: 92 mL/min/{1.73_m2} (ref >59)
GFR calc non Af Amer: 80 mL/min/{1.73_m2} (ref >59)
Globulin, Total: 2 g/dL (ref 1.5–4.5)
Glucose: 82 mg/dL (ref 65–99)
Potassium: 4.2 mmol/L (ref 3.5–5.2)
Sodium: 140 mmol/L (ref 134–144)
Total Protein: 6.6 g/dL (ref 6.0–8.5)

## 2019-05-26 LAB — CBC WITH DIFFERENTIAL/PLATELET
Basophils Absolute: 0 10*3/uL (ref 0.0–0.2)
Basos: 0 %
EOS (ABSOLUTE): 0.2 10*3/uL (ref 0.0–0.4)
Eos: 3 %
Hematocrit: 40.7 % (ref 34.0–46.6)
Hemoglobin: 14.1 g/dL (ref 11.1–15.9)
Immature Grans (Abs): 0 10*3/uL (ref 0.0–0.1)
Immature Granulocytes: 0 %
Lymphocytes Absolute: 2.5 10*3/uL (ref 0.7–3.1)
Lymphs: 30 %
MCH: 31.2 pg (ref 26.6–33.0)
MCHC: 34.6 g/dL (ref 31.5–35.7)
MCV: 90 fL (ref 79–97)
Monocytes Absolute: 0.6 10*3/uL (ref 0.1–0.9)
Monocytes: 8 %
Neutrophils Absolute: 4.9 10*3/uL (ref 1.4–7.0)
Neutrophils: 59 %
Platelets: 374 10*3/uL (ref 150–450)
RBC: 4.52 x10E6/uL (ref 3.77–5.28)
RDW: 11.6 % — ABNORMAL LOW (ref 11.7–15.4)
WBC: 8.2 10*3/uL (ref 3.4–10.8)

## 2019-05-26 LAB — LIPID PANEL W/O CHOL/HDL RATIO
Cholesterol, Total: 251 mg/dL — ABNORMAL HIGH (ref 100–199)
HDL: 38 mg/dL — ABNORMAL LOW (ref >39)
LDL Calculated: 152 mg/dL — ABNORMAL HIGH (ref 0–99)
Triglycerides: 303 mg/dL — ABNORMAL HIGH (ref 0–149)
VLDL Cholesterol Cal: 61 mg/dL — ABNORMAL HIGH (ref 5–40)

## 2019-05-26 LAB — HEPATITIS C ANTIBODY: Hep C Virus Ab: <0.1 s/co ratio (ref 0.0–0.9)

## 2019-05-26 LAB — HIV ANTIBODY (ROUTINE TESTING W REFLEX): HIV Screen 4th Generation wRfx: NONREACTIVE

## 2019-05-26 LAB — TSH: TSH: 1.66 u[IU]/mL (ref 0.450–4.500)

## 2019-05-26 NOTE — Telephone Encounter (Signed)
Returned call to patient. Spoke to her about her immunization records mailing out to her address. Letter mailed.

## 2019-05-28 NOTE — Assessment & Plan Note (Signed)
Doing well. Continue current regimen. Refills given. Call with any concerns. Continue to monitor.

## 2019-05-28 NOTE — Assessment & Plan Note (Signed)
Under good control on current regimen. Continue current regimen. Continue to monitor. Call with any concerns. Refills given.   

## 2019-05-28 NOTE — Assessment & Plan Note (Signed)
Labs drawn today. Await results. Treat as needed.  

## 2019-05-28 NOTE — Assessment & Plan Note (Signed)
Anxiety is acting up a bit with the pandemic. We will increase her celexa to 60mg  daily. Call if not helping. Continue to monitor. Call with any concerns. Rx given today.

## 2019-06-06 ENCOUNTER — Telehealth: Payer: Self-pay | Admitting: Gastroenterology

## 2019-06-06 NOTE — Telephone Encounter (Signed)
Pt left vm to schedule a colonoscopy she is returning a call

## 2019-06-07 ENCOUNTER — Telehealth: Payer: Self-pay

## 2019-06-07 ENCOUNTER — Other Ambulatory Visit: Payer: Self-pay

## 2019-06-07 DIAGNOSIS — Z1211 Encounter for screening for malignant neoplasm of colon: Secondary | ICD-10-CM

## 2019-06-07 MED ORDER — NA SULFATE-K SULFATE-MG SULF 17.5-3.13-1.6 GM/177ML PO SOLN: 1.0000 | Freq: Once | ORAL | 0 refills | Status: AC

## 2019-06-07 NOTE — Telephone Encounter (Signed)
LVM and sent mychart message for pt to call office to schedule her colonoscopy.  Thanks [employer]

## 2019-06-07 NOTE — Telephone Encounter (Signed)
Gastroenterology Pre-Procedure Review  Request Date: 06/23/19 Requesting Physician: Dr. Vicente Males  PATIENT REVIEW QUESTIONS: The patient responded to the following health history questions as indicated:    1. Are you having any GI issues? no 2. Do you have a personal history of Polyps? yes (?2009 with Dr. Tiffany Kocher) 3. Do you have a family history of Colon Cancer or Polyps? yes (two of her maternal uncles had colon cancer) 4. Diabetes Mellitus? no 5. Joint replacements in the past 12 months?no 6. Major health problems in the past 3 months?no 7. Any artificial heart valves, MVP, or defibrillator?no    MEDICATIONS & ALLERGIES:    Patient reports the following regarding taking any anticoagulation/antiplatelet therapy:   Plavix, Coumadin, Eliquis, Xarelto, Lovenox, Pradaxa, Brilinta, or Effient? no Aspirin? no  Patient confirms/reports the following medications:  Current Outpatient Medications  Medication Sig Dispense Refill  . acyclovir (ZOVIRAX) 400 MG tablet TAKE 1 TABLET (400 MG TOTAL) BY MOUTH 3 (THREE) TIMES DAILY. 90 tablet 2  . B Complex Vitamins (VITAMIN B COMPLEX) TABS Take 1 tablet by mouth daily.    . Cholecalciferol (VITAMIN D PO) Take 1,000 Units by mouth 2 (two) times daily. Total 2000 daily    . citalopram (CELEXA) 40 MG tablet Take 1.5 tablets (60 mg total) by mouth daily. 135 tablet 1  . cyclobenzaprine (FLEXERIL) 10 MG tablet Take 1 tablet (10 mg total) by mouth at bedtime. 30 tablet 0  . Erenumab-aooe (AIMOVIG) 70 MG/ML SOAJ Inject 70 mg into the skin every 30 (thirty) days. 4 pen 3  . fexofenadine-pseudoephedrine (ALLEGRA-D) 60-120 MG 12 hr tablet Take 1 tablet by mouth 2 (two) times daily. 20 tablet 0  . SUMAtriptan (IMITREX) 100 MG tablet Take 1 tablet (100 mg total) by mouth as needed. May repeat in 2 hours if headache persists or recurs. 10 tablet 12  . zolmitriptan (ZOMIG) 5 MG tablet Take 1 tablet (5 mg total) by mouth daily as needed. 6 tablet 12   No current  facility-administered medications for this visit.     Patient confirms/reports the following allergies:  No Known Allergies  No orders of the defined types were placed in this encounter.   AUTHORIZATION INFORMATION Primary Insurance: 1D#: Group #:  Secondary Insurance: 1D#: Group #:  SCHEDULE INFORMATION: Date: 06/23/19 Time: Location:[place]

## 2019-06-20 ENCOUNTER — Other Ambulatory Visit
Admission: RE | Admit: 2019-06-20 | Discharge: 2019-06-20 | Disposition: A | Discharge status: Home / Self Care | Payer: 59 | Source: Ambulatory Visit | Attending: Gastroenterology | Admitting: Gastroenterology

## 2019-06-20 ENCOUNTER — Other Ambulatory Visit: Payer: Self-pay

## 2019-06-20 DIAGNOSIS — Z01812 Encounter for preprocedural laboratory examination: Secondary | ICD-10-CM | POA: Diagnosis not present

## 2019-06-20 DIAGNOSIS — Z20828 Contact with and (suspected) exposure to other viral communicable diseases: Secondary | ICD-10-CM | POA: Insufficient documentation

## 2019-06-21 LAB — SARS CORONAVIRUS 2 (TAT 6-24 HRS): SARS Coronavirus 2: NEGATIVE

## 2019-06-23 ENCOUNTER — Encounter: Payer: Self-pay | Admitting: *Deleted

## 2019-06-23 ENCOUNTER — Ambulatory Visit: Payer: 59 | Admitting: Anesthesiology

## 2019-06-23 ENCOUNTER — Encounter
Admission: RE | Disposition: A | Discharge status: Home / Self Care | Payer: Self-pay | Source: Home / Self Care | Attending: Gastroenterology

## 2019-06-23 ENCOUNTER — Ambulatory Visit
Admission: RE | Admit: 2019-06-23 | Discharge: 2019-06-23 | Disposition: A | Discharge status: Home / Self Care | Payer: 59 | Attending: Gastroenterology | Admitting: Gastroenterology

## 2019-06-23 ENCOUNTER — Other Ambulatory Visit: Payer: Self-pay

## 2019-06-23 DIAGNOSIS — Z86010 Personal history of colonic polyps: Secondary | ICD-10-CM | POA: Diagnosis not present

## 2019-06-23 DIAGNOSIS — D126 Benign neoplasm of colon, unspecified: Secondary | ICD-10-CM | POA: Diagnosis not present

## 2019-06-23 DIAGNOSIS — E7800 Pure hypercholesterolemia, unspecified: Secondary | ICD-10-CM | POA: Insufficient documentation

## 2019-06-23 DIAGNOSIS — Z1211 Encounter for screening for malignant neoplasm of colon: Secondary | ICD-10-CM | POA: Diagnosis not present

## 2019-06-23 DIAGNOSIS — D122 Benign neoplasm of ascending colon: Secondary | ICD-10-CM | POA: Diagnosis not present

## 2019-06-23 DIAGNOSIS — F329 Major depressive disorder, single episode, unspecified: Secondary | ICD-10-CM | POA: Diagnosis not present

## 2019-06-23 DIAGNOSIS — Z79899 Other long term (current) drug therapy: Secondary | ICD-10-CM | POA: Diagnosis not present

## 2019-06-23 DIAGNOSIS — Z87891 Personal history of nicotine dependence: Secondary | ICD-10-CM | POA: Diagnosis not present

## 2019-06-23 DIAGNOSIS — K635 Polyp of colon: Secondary | ICD-10-CM | POA: Diagnosis not present

## 2019-06-23 DIAGNOSIS — B009 Herpesviral infection, unspecified: Secondary | ICD-10-CM | POA: Insufficient documentation

## 2019-06-23 HISTORY — PX: COLONOSCOPY WITH PROPOFOL: SHX5780

## 2019-06-23 SURGERY — COLONOSCOPY WITH PROPOFOL
Anesthesia: General

## 2019-06-23 MED ORDER — SODIUM CHLORIDE 0.9 % IV SOLN
INTRAVENOUS | Status: DC
  Administered 2019-06-23: 1000 mL via INTRAVENOUS

## 2019-06-23 MED ORDER — PROPOFOL 500 MG/50ML IV EMUL
INTRAVENOUS | Status: DC | PRN
  Administered 2019-06-23: 120 ug/kg/min via INTRAVENOUS

## 2019-06-23 MED ORDER — PROPOFOL 10 MG/ML IV BOLUS
INTRAVENOUS | Status: DC | PRN
  Administered 2019-06-23: 50 mg via INTRAVENOUS
  Administered 2019-06-23: 100 mg via INTRAVENOUS

## 2019-06-23 MED ORDER — PROPOFOL 10 MG/ML IV BOLUS
INTRAVENOUS | Status: AC
  Filled 2019-06-23: qty 20

## 2019-06-23 NOTE — H&P (Signed)
Jonathon Bellows, MD [address], [place], [place], [place], [address] [address], [place], [place], [place], [address] Phone: [phone]  Fax: [phone]  Primary Care Physician:  Valerie Roys, DO   Pre-Procedure History & Physical: HPI:  Debra Cannon is a 63 y.o. female is here for an colonoscopy.   Past Medical History:  Diagnosis Date  . Herpes   . Hypercholesteremia   . Menopause   . Migraine     Past Surgical History:  Procedure Laterality Date  . APPENDECTOMY    . BREAST EXCISIONAL BIOPSY Right 2019   lipoma  . COLONOSCOPY  09/03/2008   Dr Vira Agar  . laporoscopy    . REPAIR ANKLE LIGAMENT Left 2004 & 2005  . UPPER GI ENDOSCOPY  2009   Dr Vira Agar    Prior to Admission medications   Medication Sig Start Date End Date Taking? Authorizing Provider  citalopram (CELEXA) 40 MG tablet Take 1.5 tablets (60 mg total) by mouth daily. 05/25/19  Yes Johnson, Megan P, DO  acyclovir (ZOVIRAX) 400 MG tablet TAKE 1 TABLET (400 MG TOTAL) BY MOUTH 3 (THREE) TIMES DAILY. 05/25/19   Johnson, Megan P, DO  B Complex Vitamins (VITAMIN B COMPLEX) TABS Take 1 tablet by mouth daily. 05/04/17   [provider]  Cholecalciferol (VITAMIN D PO) Take 1,000 Units by mouth 2 (two) times daily. Total 2000 daily    [provider]  cyclobenzaprine (FLEXERIL) 10 MG tablet Take 1 tablet (10 mg total) by mouth at bedtime. 05/25/19   Johnson, Megan P, DO  Erenumab-aooe (AIMOVIG) 70 MG/ML SOAJ Inject 70 mg into the skin every 30 (thirty) days. 05/25/19   Johnson, Megan P, DO  fexofenadine-pseudoephedrine (ALLEGRA-D) 60-120 MG 12 hr tablet Take 1 tablet by mouth 2 (two) times daily. 05/25/19   Johnson, Megan P, DO  SUMAtriptan (IMITREX) 100 MG tablet Take 1 tablet (100 mg total) by mouth as needed. May repeat in 2 hours if headache persists or recurs. 05/25/19   Johnson, Megan P, DO  zolmitriptan (ZOMIG) 5 MG tablet Take 1 tablet (5 mg total) by mouth daily as needed. 05/25/19   Park Liter P, DO    Allergies as of 06/07/2019  . (No Known Allergies)    Family History  Problem Relation Age of Onset  . Osteoporosis Mother   . Thyroid disease Mother   . Heart disease Father   . Cancer Sister        endometrial  . Cardiomyopathy Brother   . Cancer Maternal Grandmother        vulvar  . Diabetes Maternal Grandmother   . AAA (abdominal aortic aneurysm) Maternal Grandfather   . Stroke Paternal Grandmother   . Heart disease Paternal Grandfather   . Breast cancer Neg Hx     Social History   Socioeconomic History  . Marital status: Married    Spouse name: Not on file  . Number of children: Not on file  . Years of education: Not on file  . Highest education level: Not on file  Occupational History  . Not on file  Social Needs  . Financial resource strain: Not on file  . Food insecurity    Worry: Not on file    Inability: Not on file  . Transportation needs    Medical: Not on file    Non-medical: Not on file  Tobacco Use  . Smoking status: Former Smoker    Years: 30.00    Types: Cigarettes  .  Smokeless tobacco: Never Used  Substance and Sexual Activity  . Alcohol use: Yes    Alcohol/week: 0.0 standard drinks    Comment: pt states very rare  . Drug use: No  . Sexual activity: Yes  Lifestyle  . Physical activity    Days per week: Not on file    Minutes per session: Not on file  . Stress: Not on file  Relationships  . Social Herbalist on phone: Not on file    Gets together: Not on file    Attends religious service: Not on file    Active member of club or organization: Not on file    Attends meetings of clubs or organizations: Not on file    Relationship status: Not on file  . Intimate partner violence    Fear of current or ex partner: Not on file    Emotionally abused: Not on file    Physically abused: Not on file    Forced sexual activity: Not on file  Other Topics Concern  . Not on file  Social History Narrative  . Not on file     Review of Systems: See HPI, otherwise negative ROS  Physical Exam: BP (!) 125/58   Pulse 63   Temp (!) 97.1 F (36.2 C) (Tympanic)   Ht 5\' 8"  (1.727 m)   Wt 81.6 kg   LMP  (LMP Unknown)   SpO2 98%   BMI 27.37 kg/m  General:   Alert,  pleasant and cooperative in NAD Head:  Normocephalic and atraumatic. Neck:  Supple; no masses or thyromegaly. Lungs:  Clear throughout to auscultation, normal respiratory effort.    Heart:  +S1, +S2, Regular rate and rhythm, No edema. Abdomen:  Soft, nontender and nondistended. Normal bowel sounds, without guarding, and without rebound.   Neurologic:  Alert and  oriented x4;  grossly normal neurologically.  Impression/Plan: Debra Cannon is here for an colonoscopy to be performed for surveillance due to prior history of colon polyps   Risks, benefits, limitations, and alternatives regarding  colonoscopy have been reviewed with the patient.  Questions have been answered.  All parties agreeable.   Jonathon Bellows, MD  06/23/2019, 8:33 AM

## 2019-06-23 NOTE — Transfer of Care (Signed)
Immediate Anesthesia Transfer of Care Note  Patient: Debra Cannon  Procedure(s) Performed: COLONOSCOPY WITH PROPOFOL (N/A )  Patient Location: Endoscopy Unit  Anesthesia Type:General  Level of Consciousness: drowsy and patient cooperative  Airway & Oxygen Therapy: Patient Spontanous Breathing  Post-op Assessment: Report given to RN and Post -op Vital signs reviewed and stable  Post vital signs: Reviewed and stable  Last Vitals:  Vitals Value Taken Time  BP 105/56 06/23/19 0908  Temp 36.3 C 06/23/19 0908  Pulse 67 06/23/19 0908  Resp 14 06/23/19 0908  SpO2 97 % 06/23/19 0908    Last Pain:  Vitals:   06/23/19 0908  TempSrc: Tympanic  PainSc: 0-No pain         Complications: No apparent anesthesia complications

## 2019-06-23 NOTE — Op Note (Signed)
[facility] Gastroenterology Patient Name: Debra Cannon Procedure Date: 06/23/2019 8:30 AM MRN: [id number] Account #: [id number] Date of Birth: [birth year] Admit Type: Outpatient Age: 63 Room: [facility] ENDO ROOM 4 Gender: Female Note Status: Finalized Procedure:            Colonoscopy Indications:          High risk colon cancer surveillance: Personal history                        of colonic polyps Providers:            Jonathon Bellows MD, MD Medicines:            Monitored Anesthesia Care Complications:        No immediate complications. Procedure:            Pre-Anesthesia Assessment:                       - Prior to the procedure, a History and Physical was                        performed, and patient medications, allergies and                        sensitivities were reviewed. The patient's tolerance of                        previous anesthesia was reviewed.                       - The risks and benefits of the procedure and the                        sedation options and risks were discussed with the                        patient. All questions were answered and informed                        consent was obtained.                       - ASA Grade Assessment: II - A patient with mild                        systemic disease.                       After obtaining informed consent, the colonoscope was                        passed under direct vision. Throughout the procedure,                        the patient's blood pressure, pulse, and oxygen                        saturations were monitored continuously. The                        Colonoscope was introduced through the anus and  advanced to the the cecum, identified by the                        appendiceal orifice. The colonoscopy was performed with                        ease. The patient tolerated the procedure well. The                        quality of the bowel preparation was  excellent. Findings:      The perianal and digital rectal examinations were normal.      A 3 mm polyp was found in the ascending colon. The polyp was sessile.       The polyp was removed with a cold biopsy forceps. Resection and       retrieval were complete.      The exam was otherwise without abnormality on direct and retroflexion       views. Impression:           - One 3 mm polyp in the ascending colon, removed with a                        cold biopsy forceps. Resected and retrieved.                       - The examination was otherwise normal on direct and                        retroflexion views. Recommendation:       - Discharge patient to home (with escort).                       - Resume previous diet.                       - Continue present medications.                       - Await pathology results.                       - Repeat colonoscopy in 5 years for surveillance. Procedure Code(s):    --- Professional ---                       [phone], Colonoscopy, flexible; with biopsy, single or                        multiple Diagnosis Code(s):    --- Professional ---                       Z86.010, Personal history of colonic polyps                       K63.5, Polyp of colon CPT copyright 2019 American Medical Association. All rights reserved. The codes documented in this report are preliminary and upon coder review may  be revised to meet current compliance requirements. Jonathon Bellows, MD Jonathon Bellows MD, MD 06/23/2019 9:06:16 AM This report has been signed electronically. Number of Addenda: 0 Note Initiated On: 06/23/2019 8:30 AM Scope Withdrawal Time: 0 hours 13 minutes 20 seconds  Total Procedure Duration: 0 hours 17 minutes 32 seconds  Estimated Blood Loss: Estimated blood loss: none.      [facility]

## 2019-06-23 NOTE — Anesthesia Postprocedure Evaluation (Signed)
Anesthesia Post Note  Patient: Debra Cannon  Procedure(s) Performed: COLONOSCOPY WITH PROPOFOL (N/A )  Patient location during evaluation: Endoscopy Anesthesia Type: General Level of consciousness: awake and alert Pain management: pain level controlled Vital Signs Assessment: post-procedure vital signs reviewed and stable Respiratory status: spontaneous breathing, nonlabored ventilation, respiratory function stable and patient connected to nasal cannula oxygen Cardiovascular status: blood pressure returned to baseline and stable Postop Assessment: no apparent nausea or vomiting Anesthetic complications: no     Last Vitals:  Vitals:   06/23/19 0918 06/23/19 0928  BP: 111/63 116/69  Pulse: 62 (!) 58  Resp: 11 11  Temp:    SpO2: 98% 100%    Last Pain:  Vitals:   06/23/19 0928  TempSrc:   PainSc: 0-No pain                 Martha Clan

## 2019-06-23 NOTE — Anesthesia Preprocedure Evaluation (Signed)
Anesthesia Evaluation  Patient identified by MRN, date of birth, ID band Patient awake    Reviewed: Allergy & Precautions, H&P , NPO status , Patient's Chart, lab work & pertinent test results, reviewed documented beta blocker date and time   History of Anesthesia Complications Negative for: history of anesthetic complications  Airway Mallampati: I  TM Distance: >3 FB Neck ROM: full    Dental  (+) Dental Advidsory Given, Caps, Teeth Intact   Pulmonary neg pulmonary ROS, former smoker,    Pulmonary exam normal        Cardiovascular Exercise Tolerance: Good negative cardio ROS Normal cardiovascular exam     Neuro/Psych  Headaches, neg Seizures PSYCHIATRIC DISORDERS Depression    GI/Hepatic negative GI ROS, Neg liver ROS,   Endo/Other  negative endocrine ROS  Renal/GU negative Renal ROS  negative genitourinary   Musculoskeletal   Abdominal   Peds  Hematology negative hematology ROS (+)   Anesthesia Other Findings Past Medical History: No date: Herpes No date: Hypercholesteremia No date: Menopause No date: Migraine   Reproductive/Obstetrics negative OB ROS                             Anesthesia Physical Anesthesia Plan  ASA: II  Anesthesia Plan: General   Post-op Pain Management:    Induction: Intravenous  PONV Risk Score and Plan: 3 and Propofol infusion and TIVA  Airway Management Planned: Nasal Cannula and Natural Airway  Additional Equipment:   Intra-op Plan:   Post-operative Plan:   Informed Consent: I have reviewed the patients History and Physical, chart, labs and discussed the procedure including the risks, benefits and alternatives for the proposed anesthesia with the patient or authorized representative who has indicated his/her understanding and acceptance.     Dental Advisory Given  Plan Discussed with: Anesthesiologist, CRNA and Surgeon  Anesthesia Plan  Comments:         Anesthesia Quick Evaluation

## 2019-06-23 NOTE — Anesthesia Post-op Follow-up Note (Signed)
Anesthesia QCDR form completed.        

## 2019-06-26 ENCOUNTER — Encounter: Payer: Self-pay | Admitting: Gastroenterology

## 2019-06-26 LAB — SURGICAL PATHOLOGY

## 2019-06-29 ENCOUNTER — Telehealth: Payer: Self-pay

## 2019-06-29 NOTE — Telephone Encounter (Signed)
Called pt to inform her of biopsy results and Dr. Georgeann Oppenheim recommendation.  Unable to contact, LVM to return call

## 2019-06-29 NOTE — Telephone Encounter (Signed)
Spoke with pt and informed her of biopsy result and Dr. Georgeann Oppenheim suggestion. Pt understands and agrees.

## 2019-06-29 NOTE — Telephone Encounter (Signed)
-----   Message from Jonathon Bellows, MD sent at 06/26/2019 12:34 PM EDT ----- She is a staff member please inform that the small tiny polyp that I resected was an adenoma and repeat procedure in 5 years time.

## 2019-07-17 ENCOUNTER — Ambulatory Visit (INDEPENDENT_AMBULATORY_CARE_PROVIDER_SITE_OTHER): Payer: 59 | Admitting: Family Medicine

## 2019-07-17 ENCOUNTER — Encounter: Payer: Self-pay | Admitting: Family Medicine

## 2019-07-17 VITALS — BP 150/97 | HR 71 | Temp 98.7°F

## 2019-07-17 DIAGNOSIS — J069 Acute upper respiratory infection, unspecified: Secondary | ICD-10-CM | POA: Diagnosis not present

## 2019-07-17 MED ORDER — HYDROCOD POLST-CPM POLST ER 10-8 MG/5ML PO SUER: 5.0000 mL | Freq: Two times a day (BID) | ORAL | 0 refills | Status: DC | PRN

## 2019-07-17 MED ORDER — AZITHROMYCIN 250 MG PO TABS: ORAL_TABLET | ORAL | 0 refills | Status: DC

## 2019-07-17 NOTE — Progress Notes (Signed)
BP (!) 150/97   Pulse 71   Temp 98.7 F (37.1 C)   LMP  (LMP Unknown)   SpO2 95%    Subjective:    Patient ID: Debra Cannon, female    DOB: May 26, 1956, 63 y.o.   MRN: [id number]  HPI: Debra Cannon is a 63 y.o. female  Chief Complaint  Patient presents with  . Cough    Started last Wednesday with a deep chest cough, really fatigued, chest pain. Negative COVID    . This visit was completed via WebEx due to the restrictions of the COVID-19 pandemic. All issues as above were discussed and addressed. Physical exam was done as above through visual confirmation on WebEx. If it was felt that the patient should be evaluated in the office, they were directed there. The patient verbally consented to this visit. . Location of the patient: home . Location of the provider: work . Those involved with this call:  . Provider: Merrie Roof, PA-C . CMA: Tiffany Reel, CMA . Front Desk/Registration: Jill Side  . Time spent on call: 20 minutes with patient face to face via video conference. More than 50% of this time was spent in counseling and coordination of care. 5 minutes total spent in review of patient's record and preparation of their chart. I verified patient identity using two factors (patient name and date of birth). Patient consents verbally to being seen via telemedicine visit today.   Almost a week of deep productive cough, fatigue, chest pain, occasional wheezes. Taking mucinex with minimal relief. Negative COVID test. No hx of pulmonary dz, sick contacts, recent travel.   Relevant past medical, surgical, family and social history reviewed and updated as indicated. Interim medical history since our last visit reviewed. Allergies and medications reviewed and updated.  Review of Systems  Per HPI unless specifically indicated above     Objective:    BP (!) 150/97   Pulse 71   Temp 98.7 F (37.1 C)   LMP  (LMP Unknown)   SpO2 95%   Wt Readings from Last 3 Encounters:   06/23/19 180 lb (81.6 kg)  05/25/19 185 lb (83.9 kg)  10/28/18 181 lb (82.1 kg)    Physical Exam Vitals signs and nursing note reviewed.  Constitutional:      General: She is not in acute distress.    Appearance: Normal appearance.  HENT:     Head: Atraumatic.     Right Ear: External ear normal.     Left Ear: External ear normal.     Nose: Congestion present.     Mouth/Throat:     Mouth: Mucous membranes are moist.     Pharynx: Oropharynx is clear. Posterior oropharyngeal erythema present.  Eyes:     Extraocular Movements: Extraocular movements intact.     Conjunctiva/sclera: Conjunctivae normal.  Neck:     Musculoskeletal: Normal range of motion.  Cardiovascular:     Comments: Unable to assess via virtual visit Pulmonary:     Effort: Pulmonary effort is normal. No respiratory distress.  Musculoskeletal: Normal range of motion.  Skin:    General: Skin is dry.     Findings: No erythema.  Neurological:     Mental Status: She is alert and oriented to person, place, and time.  Psychiatric:        Mood and Affect: Mood normal.        Thought Content: Thought content normal.        Judgment: Judgment normal.  Results for orders placed or performed during the hospital encounter of 06/23/19  Surgical pathology  Result Value Ref Range   SURGICAL PATHOLOGY      SURGICAL PATHOLOGY CASE: [id number] PATIENT: Debra Cannon Surgical Pathology Report     Specimen Submitted: A. Colon polyp, ascending; cbx  Clinical History: Screening colonoscopy, personal history colon polyps. Ascending colon polyp      DIAGNOSIS: A. COLON POLYP, ASCENDING; BIOPSY: - TUBULAR ADENOMA. - NEGATIVE FOR HIGH-GRADE DYSPLASIA AND MALIGNANCY.   GROSS DESCRIPTION: A. Labeled: Ascending colon polyp C BX Received: In formalin Tissue fragment(s): 1 Size: 0.3 cm Description: Tan soft tissue fragment Entirely submitted in 1 cassette.   Final Diagnosis performed by Raynelle Bring, MD.    Electronically signed 06/26/2019 12:16:09PM The electronic signature indicates that the named Attending Pathologist has evaluated the specimen Technical component performed at [facility], [address] [place], [place] [address] Lab: [phone] Dir: Rush Farmer, MD, MMM  Professional component performed at [facility], [facility], [place], [place], [place] [address] Lab: [phone] Dir: Dellia Nims. Reuel Derby, MD       Assessment & Plan:   Problem List Items Addressed This Visit    None    Visit Diagnoses    Upper respiratory tract infection, unspecified type    -  Primary   COVID 3 neg. Will tx with zpak, mucinex, allergy regimen, and tussionex prn for severe cough. Sedation and return precautions reviewed   Relevant Medications   azithromycin (ZITHROMAX) 250 MG tablet       Follow up plan: Return if symptoms worsen or fail to improve.

## 2019-07-25 DIAGNOSIS — D2271 Melanocytic nevi of right lower limb, including hip: Secondary | ICD-10-CM | POA: Diagnosis not present

## 2019-07-25 DIAGNOSIS — D2261 Melanocytic nevi of right upper limb, including shoulder: Secondary | ICD-10-CM | POA: Diagnosis not present

## 2019-07-25 DIAGNOSIS — D2272 Melanocytic nevi of left lower limb, including hip: Secondary | ICD-10-CM | POA: Diagnosis not present

## 2019-07-25 DIAGNOSIS — D225 Melanocytic nevi of trunk: Secondary | ICD-10-CM | POA: Diagnosis not present

## 2019-07-25 DIAGNOSIS — D2262 Melanocytic nevi of left upper limb, including shoulder: Secondary | ICD-10-CM | POA: Diagnosis not present

## 2019-08-10 ENCOUNTER — Telehealth: Payer: 59 | Admitting: Physician Assistant

## 2019-08-10 DIAGNOSIS — N3000 Acute cystitis without hematuria: Secondary | ICD-10-CM | POA: Diagnosis not present

## 2019-08-10 MED ORDER — CEPHALEXIN 500 MG PO CAPS: 500.0000 mg | ORAL_CAPSULE | Freq: Four times a day (QID) | ORAL | 0 refills | Status: DC

## 2019-08-10 MED ORDER — PHENAZOPYRIDINE HCL 100 MG PO TABS: 100.0000 mg | ORAL_TABLET | Freq: Three times a day (TID) | ORAL | 0 refills | Status: DC | PRN

## 2019-08-10 NOTE — Progress Notes (Signed)
I spent more than 5 min but less than 10 min on this E-Visit  We are sorry that you are not feeling well.  Here is how we plan to help!  Based on what you shared with me it looks like you most likely have a simple urinary tract infection.  A UTI (Urinary Tract Infection) is a bacterial infection of the bladder.  Most cases of urinary tract infections are simple to treat but a key part of your care is to encourage you to drink plenty of fluids and watch your symptoms carefully.  I have prescribed Keflex 500 mg twice a day for 7 days.  Your symptoms should gradually improve. Call us if the burning in your urine worsens, you develop worsening fever, back pain or pelvic pain or if your symptoms do not resolve after completing the antibiotic.  Urinary tract infections can be prevented by drinking plenty of water to keep your body hydrated.  Also be sure when you wipe, wipe from front to back and don't hold it in!  If possible, empty your bladder every 4 hours.  Your e-visit answers were reviewed by a board certified advanced clinical practitioner to complete your personal care plan.  Depending on the condition, your plan could have included both over the counter or prescription medications.  If there is a problem please reply  once you have received a response from your provider.  Your safety is important to [place].  If you have drug allergies check your prescription carefully.    You can use MyChart to ask questions about today's visit, request a non-urgent call back, or ask for a work or school excuse for 24 hours related to this e-Visit. If it has been greater than 24 hours you will need to follow up with your provider, or enter a new e-Visit to address those concerns.   You will get an e-mail in the next two days asking about your experience.  I hope that your e-visit has been valuable and will speed your recovery. Thank you for using e-visits.

## 2019-09-07 ENCOUNTER — Ambulatory Visit
Admission: RE | Admit: 2019-09-07 | Discharge: 2019-09-07 | Disposition: A | Discharge status: Home / Self Care | Payer: 59 | Source: Ambulatory Visit | Attending: Family Medicine | Admitting: Family Medicine

## 2019-09-07 DIAGNOSIS — Z1239 Encounter for other screening for malignant neoplasm of breast: Secondary | ICD-10-CM

## 2019-09-07 DIAGNOSIS — Z1231 Encounter for screening mammogram for malignant neoplasm of breast: Secondary | ICD-10-CM | POA: Diagnosis not present

## 2019-10-16 ENCOUNTER — Telehealth: Payer: Self-pay

## 2019-10-16 NOTE — Telephone Encounter (Signed)
PA for Aimovig initiated and submitted via Cover My Meds. Key: [facility]

## 2019-10-23 ENCOUNTER — Encounter: Payer: Self-pay | Admitting: Family Medicine

## 2019-10-23 NOTE — Telephone Encounter (Signed)
PA denied.

## 2019-10-24 ENCOUNTER — Other Ambulatory Visit: Payer: Self-pay | Admitting: Family Medicine

## 2019-10-24 MED ORDER — AIMOVIG 70 MG/ML ~~LOC~~ SOAJ: 70.0000 mg | SUBCUTANEOUS | 12 refills | Status: DC

## 2019-11-30 ENCOUNTER — Other Ambulatory Visit: Payer: Self-pay | Admitting: Family Medicine

## 2019-11-30 ENCOUNTER — Telehealth (INDEPENDENT_AMBULATORY_CARE_PROVIDER_SITE_OTHER): Payer: 59 | Admitting: Family Medicine

## 2019-11-30 ENCOUNTER — Encounter: Payer: Self-pay | Admitting: Family Medicine

## 2019-11-30 ENCOUNTER — Ambulatory Visit: Payer: 59 | Admitting: Family Medicine

## 2019-11-30 DIAGNOSIS — G43909 Migraine, unspecified, not intractable, without status migrainosus: Secondary | ICD-10-CM | POA: Diagnosis not present

## 2019-11-30 DIAGNOSIS — E7800 Pure hypercholesterolemia, unspecified: Secondary | ICD-10-CM

## 2019-11-30 DIAGNOSIS — F325 Major depressive disorder, single episode, in full remission: Secondary | ICD-10-CM

## 2019-11-30 MED ORDER — SUMATRIPTAN SUCCINATE 100 MG PO TABS: 100.0000 mg | ORAL_TABLET | ORAL | 12 refills | Status: DC | PRN

## 2019-11-30 MED ORDER — ZOLMITRIPTAN 5 MG PO TABS: 5.0000 mg | ORAL_TABLET | Freq: Every day | ORAL | 12 refills | Status: DC | PRN

## 2019-11-30 MED ORDER — ACYCLOVIR 400 MG PO TABS: ORAL_TABLET | ORAL | 2 refills | Status: DC

## 2019-11-30 MED ORDER — CITALOPRAM HYDROBROMIDE 40 MG PO TABS: 60.0000 mg | ORAL_TABLET | Freq: Every day | ORAL | 1 refills | Status: DC

## 2019-11-30 NOTE — Assessment & Plan Note (Signed)
Will hold on rechecking until August. Treat at that time as needed.

## 2019-11-30 NOTE — Progress Notes (Signed)
LMP  (LMP Unknown)    Subjective:    Patient ID: Debra Cannon, female    DOB: [birth year], 64 y.o.   MRN: [id number]  HPI: Debra Cannon is a 64 y.o. female  Chief Complaint  Patient presents with  . Hyperlipidemia  . Depression   DEPRESSION Mood status: stable Satisfied with current treatment?: yes Symptom severity: mild  Duration of current treatment : chronic Side effects: no Medication compliance: excellent compliance Psychotherapy/counseling: no  Previous psychiatric medications: celexa Depressed mood: yes Anxious mood: yes Anhedonia: no Significant weight loss or gain: no Insomnia: no  Fatigue: yes Feelings of worthlessness or guilt: no Impaired concentration/indecisiveness: no Suicidal ideations: no Hopelessness: no Crying spells: no Depression screen [facility] 2/9 11/30/2019 05/25/2019 10/28/2018 09/15/2018 03/14/2018  Decreased Interest 1 1 0 1 0  Down, Depressed, Hopeless 0 1 1 1  0  PHQ - 2 Score 1 2 1 2  0  Altered sleeping 1 1 1 1  0  Tired, decreased energy 1 1 1 1 1   Change in appetite 0 0 0 0 0  Feeling bad or failure about yourself  0 1 1 1  0  Trouble concentrating 1 2 0 0 0  Moving slowly or fidgety/restless 0 0 0 0 0  Suicidal thoughts 0 0 0 0 0  PHQ-9 Score 4 7 4 5 1   Difficult doing work/chores - Very difficult Not difficult at all Somewhat difficult -  Some recent data might be hidden   HYPERLIPIDEMIA Hyperlipidemia status: excellent compliance Satisfied with current treatment?  no Side effects:  Not on anything Supplements: none Aspirin:  no The 10-year ASCVD risk score [name], et al., 2013) is: 7.3%   Values used to calculate the score:     Age: 44 years     Sex: Female     Is Non-Hispanic African American: No     Diabetic: No     Tobacco smoker: No     Systolic Blood Pressure: [id number] mmHg     Is BP treated: No     HDL Cholesterol: 38 mg/dL     Total Cholesterol: 251 mg/dL Chest pain:  no Coronary artery disease:  no  Doing well with  the amiovig. Has figured out chocolate is a trigger for her. Has been having migraines about 3x a month. Wants to stay where she is on the [place]. Trip  Relevant past medical, surgical, family and social history reviewed and updated as indicated. Interim medical history since our last visit reviewed. Allergies and medications reviewed and updated.  Review of Systems  Constitutional: Negative.   HENT: Negative.   Respiratory: Negative.   Cardiovascular: Negative.   Genitourinary: Negative.   Musculoskeletal: Negative.   Neurological: Positive for headaches. Negative for dizziness, tremors, seizures, syncope, facial asymmetry, speech difficulty, weakness, light-headedness and numbness.  Hematological: Negative.   Psychiatric/Behavioral: Negative.     Per HPI unless specifically indicated above     Objective:    LMP  (LMP Unknown)   Wt Readings from Last 3 Encounters:  06/23/19 180 lb (81.6 kg)  05/25/19 185 lb (83.9 kg)  10/28/18 181 lb (82.1 kg)    Physical Exam Vitals and nursing note reviewed.  Constitutional:      General: She is not in acute distress.    Appearance: Normal appearance. She is not ill-appearing, toxic-appearing or diaphoretic.  HENT:     Head: Normocephalic and atraumatic.     Right Ear: External ear normal.     Left  Ear: External ear normal.     Nose: Nose normal.     Mouth/Throat:     Mouth: Mucous membranes are moist.     Pharynx: Oropharynx is clear.  Eyes:     General: No scleral icterus.       Right eye: No discharge.        Left eye: No discharge.     Conjunctiva/sclera: Conjunctivae normal.     Pupils: Pupils are equal, round, and reactive to light.  Pulmonary:     Effort: Pulmonary effort is normal. No respiratory distress.     Comments: Speaking in full sentences Musculoskeletal:        General: Normal range of motion.     Cervical back: Normal range of motion.  Skin:    Coloration: Skin is not jaundiced or pale.     Findings: No  bruising, erythema, lesion or rash.  Neurological:     Mental Status: She is alert and oriented to person, place, and time. Mental status is at baseline.  Psychiatric:        Mood and Affect: Mood normal.        Behavior: Behavior normal.        Thought Content: Thought content normal.        Judgment: Judgment normal.     Results for orders placed or performed during the hospital encounter of 06/23/19  Surgical pathology  Result Value Ref Range   SURGICAL PATHOLOGY      SURGICAL PATHOLOGY CASE: [id number] PATIENT: Brighten Plude Surgical Pathology Report     Specimen Submitted: A. Colon polyp, ascending; cbx  Clinical History: Screening colonoscopy, personal history colon polyps. Ascending colon polyp      DIAGNOSIS: A. COLON POLYP, ASCENDING; BIOPSY: - TUBULAR ADENOMA. - NEGATIVE FOR HIGH-GRADE DYSPLASIA AND MALIGNANCY.   GROSS DESCRIPTION: A. Labeled: Ascending colon polyp C BX Received: In formalin Tissue fragment(s): 1 Size: 0.3 cm Description: Tan soft tissue fragment Entirely submitted in 1 cassette.   Final Diagnosis performed by Raynelle Bring, MD.   Electronically signed 06/26/2019 12:16:09PM The electronic signature indicates that the named Attending Pathologist has evaluated the specimen Technical component performed at [facility], [address] [place], [place] [address] Lab: [id number] Dir: Rush Farmer, MD, MMM  Professional component performed at [facility], [facility], [place], [place], [place] [address] Lab: [phone] Dir: Dellia Nims. Reuel Derby, MD       Assessment & Plan:   Problem List Items Addressed This Visit      Cardiovascular and Mediastinum   Migraine    Doing well on the aimovig. Does not want to go up on the dose. Continue to monitor. Call with any concerns.       Relevant Medications   zolmitriptan (ZOMIG) 5 MG tablet   SUMAtriptan (IMITREX) 100 MG tablet   citalopram (CELEXA) 40 MG tablet      Other   Hypercholesteremia    Will hold on rechecking until August. Treat at that time as needed.      Major depression in full remission ([place]) - Primary    Under good control on current regimen. Continue current regimen. Continue to monitor. Call with any concerns. Refills given.        Relevant Medications   citalopram (CELEXA) 40 MG tablet       Follow up plan: Return in about 6 months (around 05/29/2020) for Physical.   . This visit was completed via MyChart due to the restrictions of the COVID-19  pandemic. All issues as above were discussed and addressed. Physical exam was done as above through visual confirmation on MyChart. If it was felt that the patient should be evaluated in the office, they were directed there. The patient verbally consented to this visit. . Location of the patient: home . Location of the provider: work . Those involved with this call:  . Provider: Park Liter, DO . CMA: Lauretta Grill, RMA . Front Desk/Registration: Don Perking  . Time spent on call: 25 minutes with patient face to face via video conference. More than 50% of this time was spent in counseling and coordination of care. 40 minutes total spent in review of patient's record and preparation of their chart.

## 2019-11-30 NOTE — Assessment & Plan Note (Signed)
Doing well on the aimovig. Does not want to go up on the dose. Continue to monitor. Call with any concerns.

## 2019-11-30 NOTE — Patient Instructions (Signed)
Hip Exercises Ask your health care provider which exercises are safe for you. Do exercises exactly as told by your health care provider and adjust them as directed. It is normal to feel mild stretching, pulling, tightness, or discomfort as you do these exercises. Stop right away if you feel sudden pain or your pain gets worse. Do not begin these exercises until told by your health care provider. Stretching and range-of-motion exercises These exercises warm up your muscles and joints and improve the movement and flexibility of your hip. These exercises also help to relieve pain, numbness, and tingling. You may be asked to limit your range of motion if you had a hip replacement. Talk to your health care provider about these restrictions. Hamstrings, supine  1. Lie on your back (supine position). 2. Loop a belt or towel over the ball of your left / right foot. The ball of your foot is on the walking surface, right under your toes. 3. Straighten your left / right knee and slowly pull on the belt or towel to raise your leg until you feel a gentle stretch behind your knee (hamstring). ? Do not let your knee bend while you do this. ? Keep your other leg flat on the floor. 4. Hold this position for __________ seconds. 5. Slowly return your leg to the starting position. Repeat __________ times. Complete this exercise __________ times a day. Hip rotation  1. Lie on your back on a firm surface. 2. With your left / right hand, gently pull your left / right knee toward the shoulder that is on the same side of the body. Stop when your knee is pointing toward the ceiling. 3. Hold your left / right ankle with your other hand. 4. Keeping your knee steady, gently pull your left / right ankle toward your other shoulder until you feel a stretch in your buttocks. ? Keep your hips and shoulders firmly planted while you do this stretch. 5. Hold this position for __________ seconds. Repeat __________ times. Complete  this exercise __________ times a day. Seated stretch This exercise is sometimes called hamstrings and adductors stretch. 1. Sit on the floor with your legs stretched wide. Keep your knees straight during this exercise. 2. Keeping your head and back in a straight line, bend at your waist to reach for your left foot (position A). You should feel a stretch in your right inner thigh (adductors). 3. Hold this position for __________ seconds. Then slowly return to the upright position. 4. Keeping your head and back in a straight line, bend at your waist to reach forward (position B). You should feel a stretch behind both of your thighs and knees (hamstrings). 5. Hold this position for __________ seconds. Then slowly return to the upright position. 6. Keeping your head and back in a straight line, bend at your waist to reach for your right foot (position C). You should feel a stretch in your left inner thigh (adductors). 7. Hold this position for __________ seconds. Then slowly return to the upright position. Repeat __________ times. Complete this exercise __________ times a day. Lunge This exercise stretches the muscles of the hip (hip flexors). 1. Place your left / right knee on the floor and bend your other knee so that is directly over your ankle. You should be half-kneeling. 2. Keep good posture with your head over your shoulders. 3. Tighten your buttocks to point your tailbone downward. This will prevent your back from arching too much. 4. You should feel a   gentle stretch in the front of your left / right thigh and hip. If you do not feel a stretch, slide your other foot forward slightly and then slowly lunge forward with your chest up until your knee once again lines up over your ankle. ? Make sure your tailbone continues to point downward. 5. Hold this position for __________ seconds. 6. Slowly return to the starting position. Repeat __________ times. Complete this exercise __________ times a  day. Strengthening exercises These exercises build strength and endurance in your hip. Endurance is the ability to use your muscles for a long time, even after they get tired. Bridge This exercise strengthens the muscles of your hip (hip extensors). 1. Lie on your back on a firm surface with your knees bent and your feet flat on the floor. 2. Tighten your buttocks muscles and lift your bottom off the floor until the trunk of your body and your hips are level with your thighs. ? Do not arch your back. ? You should feel the muscles working in your buttocks and the back of your thighs. If you do not feel these muscles, slide your feet 1-2 inches (2.5-5 cm) farther away from your buttocks. 3. Hold this position for __________ seconds. 4. Slowly lower your hips to the starting position. 5. Let your muscles relax completely between repetitions. Repeat __________ times. Complete this exercise __________ times a day. Straight leg raises, side-lying This exercise strengthens the muscles that move the hip joint away from the center of the body (hip abductors). 1. Lie on your side with your left / right leg in the top position. Lie so your head, shoulder, hip, and knee line up. You may bend your bottom knee slightly to help you balance. 2. Roll your hips slightly forward, so your hips are stacked directly over each other and your left / right knee is facing forward. 3. Leading with your heel, lift your top leg 4-6 inches (10-15 cm). You should feel the muscles in your top hip lifting. ? Do not let your foot drift forward. ? Do not let your knee roll toward the ceiling. 4. Hold this position for __________ seconds. 5. Slowly return to the starting position. 6. Let your muscles relax completely between repetitions. Repeat __________ times. Complete this exercise __________ times a day. Straight leg raises, side-lying This exercise strengthens the muscles that move the hip joint toward the center of the  body (hip adductors). 1. Lie on your side with your left / right leg in the bottom position. Lie so your head, shoulder, hip, and knee line up. You may place your upper foot in front to help you balance. 2. Roll your hips slightly forward, so your hips are stacked directly over each other and your left / right knee is facing forward. 3. Tense the muscles in your inner thigh and lift your bottom leg 4-6 inches (10-15 cm). 4. Hold this position for __________ seconds. 5. Slowly return to the starting position. 6. Let your muscles relax completely between repetitions. Repeat __________ times. Complete this exercise __________ times a day. Straight leg raises, supine This exercise strengthens the muscles in the front of your thigh (quadriceps). 1. Lie on your back (supine position) with your left / right leg extended and your other knee bent. 2. Tense the muscles in the front of your left / right thigh. You should see your kneecap slide up or see increased dimpling just above your knee. 3. Keep these muscles tight as you raise your   leg 4-6 inches (10-15 cm) off the floor. Do not let your knee bend. 4. Hold this position for __________ seconds. 5. Keep these muscles tense as you lower your leg. 6. Relax the muscles slowly and completely between repetitions. Repeat __________ times. Complete this exercise __________ times a day. Hip abductors, standing This exercise strengthens the muscles that move the leg and hip joint away from the center of the body (hip abductors). 1. Tie one end of a rubber exercise band or tubing to a secure surface, such as a chair, table, or pole. 2. Loop the other end of the band or tubing around your left / right ankle. 3. Keeping your ankle with the band or tubing directly opposite the secured end, step away until there is tension in the tubing or band. Hold on to a chair, table, or pole as needed for balance. 4. Lift your left / right leg out to your side. While you do  this: ? Keep your back upright. ? Keep your shoulders over your hips. ? Keep your toes pointing forward. ? Make sure to use your hip muscles to slowly lift your leg. Do not tip your body or forcefully lift your leg. 5. Hold this position for __________ seconds. 6. Slowly return to the starting position. Repeat __________ times. Complete this exercise __________ times a day. Squats This exercise strengthens the muscles in the front of your thigh (quadriceps). 1. Stand in a door frame so your feet and knees are in line with the frame. You may place your hands on the frame for balance. 2. Slowly bend your knees and lower your hips like you are going to sit in a chair. ? Keep your lower legs in a straight-up-and-down position. ? Do not let your hips go lower than your knees. ? Do not bend your knees lower than told by your health care provider. ? If your hip pain increases, do not bend as low. 3. Hold this position for ___________ seconds. 4. Slowly push with your legs to return to standing. Do not use your hands to pull yourself to standing. Repeat __________ times. Complete this exercise __________ times a day. This information is not intended to replace advice given to you by your health care provider. Make sure you discuss any questions you have with your health care provider. Document Revised: 04/27/2019 Document Reviewed: 08/02/2018 Elsevier Patient Education  2020 [employer]  

## 2019-11-30 NOTE — Assessment & Plan Note (Signed)
Under good control on current regimen. Continue current regimen. Continue to monitor. Call with any concerns. Refills given.   

## 2020-01-11 ENCOUNTER — Encounter: Payer: Self-pay | Admitting: Family Medicine

## 2020-01-12 ENCOUNTER — Ambulatory Visit: Payer: 59 | Admitting: Nurse Practitioner

## 2020-01-12 ENCOUNTER — Ambulatory Visit
Admission: RE | Admit: 2020-01-12 | Discharge: 2020-01-12 | Disposition: A | Discharge status: Home / Self Care | Payer: 59 | Source: Ambulatory Visit | Attending: Nurse Practitioner | Admitting: Nurse Practitioner

## 2020-01-12 ENCOUNTER — Ambulatory Visit
Admission: RE | Admit: 2020-01-12 | Discharge: 2020-01-12 | Disposition: A | Discharge status: Home / Self Care | Payer: 59 | Attending: Nurse Practitioner | Admitting: Nurse Practitioner

## 2020-01-12 ENCOUNTER — Encounter: Payer: Self-pay | Admitting: Nurse Practitioner

## 2020-01-12 ENCOUNTER — Ambulatory Visit: Payer: 59 | Admitting: Family Medicine

## 2020-01-12 ENCOUNTER — Other Ambulatory Visit: Payer: Self-pay

## 2020-01-12 VITALS — BP 118/78 | HR 69 | Temp 98.1°F | Wt 186.0 lb

## 2020-01-12 DIAGNOSIS — R0789 Other chest pain: Secondary | ICD-10-CM

## 2020-01-12 NOTE — Patient Instructions (Signed)

## 2020-01-12 NOTE — Assessment & Plan Note (Signed)
Acute since Sunday, takes Prilosec daily.  Has significant family cardiac history and has not seen cardiology since 2010.  NSR EKG in office today, no red flags on physical exam.  Will obtain labs today to include CBC, TSH, BNP, lipid, CMP.  Obtain CXR.  Referral to cardiology, would benefit from return to them based on family history and current symptoms.  Previous lipid levels elevated and ASCVD 4.6%, but due to family history and LDL 152 may benefit from statin addition in future. Recommend she monitor BP at home daily and document, focus on DASH diet.  Discussed red flag symptoms and she is aware to immediately go to ER if present.  Return tin 4 weeks to see PCP, sooner if any worsening of symptoms.

## 2020-01-12 NOTE — Progress Notes (Signed)
BP 118/78 (BP Location: Left Arm)   Pulse 69   Temp 98.1 F (36.7 C) (Oral)   Wt 186 lb (84.4 kg)   LMP  (LMP Unknown)   SpO2 98%   BMI 28.28 kg/m    Subjective:    Patient ID: Debra Cannon, female    DOB: 11-02-1955, 64 y.o.   MRN: [id number]  HPI: Debra Cannon is a 64 y.o. female  Chief Complaint  Patient presents with  . Chest Pain    since last Sunday   CHEST PRESSURE: Started on Sunday, had migraine Sunday and Monday.  Tuesday went to work, but went home after 1/2 day as did not feel good.  On Tuesday, felt "really tired" and had nausea that morning.  When she exerts the pressure is more intense. Pressure is felt in sternal area, feels like wants to place hand there and press.  Denies any palpitations felt or orthopnea.  Does have SOB with exertion, feels like needs to breath deeper.  In morning she has been waking up with coarse cough, but nothing productive.  She does take Prilosec almost every day.  Is currently taking Aimovig and Imitrex for her migraines.  Reports a couple episodes of sweating with the chest pressure.    Has family history of MI in father and grandfather, father had 91 MIs with multiple stents.  Brother has cardiomyopathy, no MI, has defib in place.  Grandmother, father's mother, had stroke.  Her father was around 24 when first MI presented.  Did see Dr. Ubaldo Glassing in 2010 for cardiac work-up due to chest pain, she can not recall if it was same as now, had [place] Gated Mycardial ST scan with normal LV function.  At the time she had colonoscopy, laparoscopy and multiple tests, but could not find reason for the pain.  She does have history of tick bite with nerve pain, sometimes hurts "so bad all over my body", like "pin pricking my neck".   Aspirin: no Recurrent headaches: migraines Visual changes: no Palpitations: she noticed it with stethocscope a couple times Dyspnea: with exertion and CP worse with exertion Chest pain: chest pressure at baseline and CP worse with  exertion Lower extremity edema: no Dizzy/lightheaded: no  The 10-year ASCVD risk score Debra Cannon., et al., 2013) is: 4.6%   Values used to calculate the score:     Age: 28 years     Sex: Female     Is Non-Hispanic African American: No     Diabetic: No     Tobacco smoker: No     Systolic Blood Pressure: 123456 mmHg     Is BP treated: No     HDL Cholesterol: 38 mg/dL     Total Cholesterol: 251 mg/dL  Relevant past medical, surgical, family and social history reviewed and updated as indicated. Interim medical history since our last visit reviewed. Allergies and medications reviewed and updated.  Review of Systems  Constitutional: Negative for activity change, appetite change, diaphoresis, fatigue and fever.  Respiratory: Positive for cough, chest tightness and shortness of breath (with exertion).   Cardiovascular: Positive for chest pain. Negative for palpitations and leg swelling.  Gastrointestinal: Negative for abdominal distention, abdominal pain, constipation, diarrhea, nausea and vomiting.  Endocrine: Negative.   Neurological: Positive for headaches (migraines). Negative for dizziness, syncope, weakness, light-headedness and numbness.  Psychiatric/Behavioral: Negative.     Per HPI unless specifically indicated above     Objective:    BP 118/78 (BP Location:  Left Arm)   Pulse 69   Temp 98.1 F (36.7 C) (Oral)   Wt 186 lb (84.4 kg)   LMP  (LMP Unknown)   SpO2 98%   BMI 28.28 kg/m   Wt Readings from Last 3 Encounters:  01/12/20 186 lb (84.4 kg)  06/23/19 180 lb (81.6 kg)  05/25/19 185 lb (83.9 kg)    Physical Exam Vitals and nursing note reviewed.  Constitutional:      General: She is awake. She is not in acute distress.    Appearance: She is well-developed and well-groomed. She is not ill-appearing.  HENT:     Head: Normocephalic.     Right Ear: Hearing normal.     Left Ear: Hearing normal.  Eyes:     General: Lids are normal.        Right eye: No discharge.         Left eye: No discharge.     Conjunctiva/sclera: Conjunctivae normal.     Pupils: Pupils are equal, round, and reactive to light.  Neck:     Thyroid: No thyromegaly.     Vascular: No carotid bruit.  Cardiovascular:     Rate and Rhythm: Normal rate and regular rhythm.     Heart sounds: Normal heart sounds. No murmur. No gallop.   Pulmonary:     Effort: Pulmonary effort is normal. No accessory muscle usage or respiratory distress.     Breath sounds: Normal breath sounds.  Abdominal:     General: Bowel sounds are normal.     Palpations: Abdomen is soft.     Tenderness: There is no abdominal tenderness.  Musculoskeletal:     Cervical back: Normal range of motion and neck supple.     Right lower leg: No edema.     Left lower leg: No edema.  Skin:    General: Skin is warm and dry.  Neurological:     Mental Status: She is alert and oriented to person, place, and time.     Deep Tendon Reflexes: Reflexes are normal and symmetric.     Reflex Scores:      Brachioradialis reflexes are 2+ on the right side and 2+ on the left side.      Patellar reflexes are 2+ on the right side and 2+ on the left side. Psychiatric:        Attention and Perception: Attention normal.        Mood and Affect: Mood normal.        Speech: Speech normal.        Behavior: Behavior normal. Behavior is cooperative.        Thought Content: Thought content normal.    EKG performed in office with NSR, normal axis, and x 1 PAC noted.  Results for orders placed or performed during the hospital encounter of 06/23/19  Surgical pathology  Result Value Ref Range   SURGICAL PATHOLOGY      SURGICAL PATHOLOGY CASE: [id number] PATIENT: Debra Cannon Surgical Pathology Report     Specimen Submitted: A. Colon polyp, ascending; cbx  Clinical History: Screening colonoscopy, personal history colon polyps. Ascending colon polyp      DIAGNOSIS: A. COLON POLYP, ASCENDING; BIOPSY: - TUBULAR ADENOMA. -  NEGATIVE FOR HIGH-GRADE DYSPLASIA AND MALIGNANCY.   GROSS DESCRIPTION: A. Labeled: Ascending colon polyp C BX Received: In formalin Tissue fragment(s): 1 Size: 0.3 cm Description: Tan soft tissue fragment Entirely submitted in 1 cassette.   Final Diagnosis performed by Raynelle Bring, MD.  Electronically signed 06/26/2019 12:16:09PM The electronic signature indicates that the named Attending Pathologist has evaluated the specimen Technical component performed at [facility], [address], [place], [place] [address] Lab: [id number] Dir: Rush Farmer, MD, MMM  Professional component performed at [facility], [facility], [place], [place], [place] [address] Lab: [phone] Dir: Dellia Nims. Reuel Derby, MD       Assessment & Plan:   Problem List Items Addressed This Visit      Other   Chest pressure - Primary    Acute since Sunday, takes Prilosec daily.  Has significant family cardiac history and has not seen cardiology since 2010.  NSR EKG in office today, no red flags on physical exam.  Will obtain labs today to include CBC, TSH, BNP, lipid, CMP.  Obtain CXR.  Referral to cardiology, would benefit from return to them based on family history and current symptoms.  Previous lipid levels elevated and ASCVD 4.6%, but due to family history and LDL 152 may benefit from statin addition in future. Recommend she monitor BP at home daily and document, focus on DASH diet.  Discussed red flag symptoms and she is aware to immediately go to ER if present.  Return tin 4 weeks to see PCP, sooner if any worsening of symptoms.        Relevant Orders   EKG 12-Lead (Completed)   TSH   Comprehensive metabolic panel   Lipid Panel w/o Chol/HDL Ratio   CBC with Differential/Platelet   B Nat Peptide   DG Chest 2 View   Ambulatory referral to Cardiology       Follow up plan: Return in about 4 weeks (around 02/09/2020) for CP follow-up with Dr. Lenna Sciara.

## 2020-01-13 LAB — COMPREHENSIVE METABOLIC PANEL
ALT: 23 IU/L (ref 0–32)
AST: 21 IU/L (ref 0–40)
Albumin/Globulin Ratio: 2 (ref 1.2–2.2)
Albumin: 4.7 g/dL (ref 3.8–4.8)
Alkaline Phosphatase: 102 IU/L (ref 39–117)
BUN/Creatinine Ratio: 15 (ref 12–28)
BUN: 14 mg/dL (ref 8–27)
Bilirubin Total: 0.5 mg/dL (ref 0.0–1.2)
CO2: 24 mmol/L (ref 20–29)
Calcium: 9.7 mg/dL (ref 8.7–10.3)
Chloride: 99 mmol/L (ref 96–106)
Creatinine, Ser: 0.92 mg/dL (ref 0.57–1.00)
GFR calc Af Amer: 78 mL/min/{1.73_m2} (ref >59)
GFR calc non Af Amer: 67 mL/min/{1.73_m2} (ref >59)
Globulin, Total: 2.3 g/dL (ref 1.5–4.5)
Glucose: 89 mg/dL (ref 65–99)
Potassium: 4.1 mmol/L (ref 3.5–5.2)
Sodium: 137 mmol/L (ref 134–144)
Total Protein: 7 g/dL (ref 6.0–8.5)

## 2020-01-13 LAB — LIPID PANEL W/O CHOL/HDL RATIO
Cholesterol, Total: 254 mg/dL — ABNORMAL HIGH (ref 100–199)
HDL: 40 mg/dL (ref >39)
LDL Chol Calc (NIH): 169 mg/dL — ABNORMAL HIGH (ref 0–99)
Triglycerides: 237 mg/dL — ABNORMAL HIGH (ref 0–149)
VLDL Cholesterol Cal: 45 mg/dL — ABNORMAL HIGH (ref 5–40)

## 2020-01-13 LAB — CBC WITH DIFFERENTIAL/PLATELET
Basophils Absolute: 0 10*3/uL (ref 0.0–0.2)
Basos: 1 %
EOS (ABSOLUTE): 0.3 10*3/uL (ref 0.0–0.4)
Eos: 3 %
Hematocrit: 42.4 % (ref 34.0–46.6)
Hemoglobin: 15 g/dL (ref 11.1–15.9)
Immature Grans (Abs): 0 10*3/uL (ref 0.0–0.1)
Immature Granulocytes: 1 %
Lymphocytes Absolute: 2.7 10*3/uL (ref 0.7–3.1)
Lymphs: 32 %
MCH: 31.9 pg (ref 26.6–33.0)
MCHC: 35.4 g/dL (ref 31.5–35.7)
MCV: 90 fL (ref 79–97)
Monocytes Absolute: 0.7 10*3/uL (ref 0.1–0.9)
Monocytes: 8 %
Neutrophils Absolute: 4.8 10*3/uL (ref 1.4–7.0)
Neutrophils: 55 %
Platelets: 380 10*3/uL (ref 150–450)
RBC: 4.7 x10E6/uL (ref 3.77–5.28)
RDW: 11.8 % (ref 11.7–15.4)
WBC: 8.5 10*3/uL (ref 3.4–10.8)

## 2020-01-13 LAB — TSH: TSH: 1.38 u[IU]/mL (ref 0.450–4.500)

## 2020-01-13 LAB — BRAIN NATRIURETIC PEPTIDE: BNP: 16.3 pg/mL (ref 0.0–100.0)

## 2020-01-14 NOTE — Progress Notes (Signed)
Contacted via MyChart The 10-year ASCVD risk score [name], et al., 2013) is: 4.5%   Values used to calculate the score:     Age: 64 years     Sex: Female     Is Non-Hispanic African American: No     Diabetic: No     Tobacco smoker: No     Systolic Blood Pressure: 123456 mmHg     Is BP treated: No     HDL Cholesterol: 40 mg/dL     Total Cholesterol: 254 mg/dL

## 2020-01-14 NOTE — Progress Notes (Signed)
Contacted via MyChart

## 2020-01-16 ENCOUNTER — Encounter: Payer: Self-pay | Admitting: Nurse Practitioner

## 2020-01-16 ENCOUNTER — Other Ambulatory Visit: Payer: Self-pay | Admitting: Nurse Practitioner

## 2020-01-16 MED ORDER — PREDNISONE 10 MG PO TABS: ORAL_TABLET | ORAL | 0 refills | Status: DC

## 2020-01-16 NOTE — Progress Notes (Signed)
New Outpatient Visit Date: 01/17/2020  Referring Provider: Moreen Fowler, NP [place] [address]   Chief Complaint: Chest pain  HPI:  Debra Cannon is a 64 y.o. female who is being seen today for the evaluation of chest pain at the request of Moreen Fowler, NP. She has a history of hyperlipidemia, migraine headaches, and family history of cardiovascular disease.   Today, Debra Cannon reports that she has been having persistent substernal chest pressure without radiation since Easter, almost 2 weeks ago.  The pain worsens when she walks and has been accompanied by exertional dyspnea as well as some intermittent nausea.  Currently, she reports 6-7/10 chest pain.  At its worst, it has been 8/10.  The pain is not worsened by deep breathing; if anything, breathing out seems to leave her with a an uncomfortable feeling in the chest.  Right before the pain first began, she had a migraine headache, which she has from time to time.  She denies a history of prior heart disease and underwent stress testing over 10 years ago due to abdominal and chest pain.  She reports that the stress test was unremarkable.  Debra Cannon has experienced occasional palpitations that she describes as flutters or skipped beats.  She denies a history of PE and does not have any risk factors including recent immobilization, prolonged travel, or cancer.  She was given a prescription for prednisone yesterday in case her chest pain is due to pleurisy, though she has yet to begin this.  --------------------------------------------------------------------------------------------------  Cardiovascular History & Procedures: Cardiovascular Problems:  Chest pain  Risk Factors:  Hyperlipidemia, prior tobacco use, and family history  Cath/PCI:  None  CV Surgery:  None  EP Procedures and Devices:  None  Non-Invasive Evaluation(s):  None available  Recent CV Pertinent Labs: Lab Results  Component Value Date     CHOL 254 (H) 01/12/2020   HDL 40 01/12/2020   LDLCALC 169 (H) 01/12/2020   TRIG 237 (H) 01/12/2020   BNP 16.3 01/12/2020   K 4.1 01/12/2020   BUN 14 01/12/2020   CREATININE 0.92 01/12/2020    --------------------------------------------------------------------------------------------------  Past Medical History:  Diagnosis Date  . Herpes   . Hypercholesteremia   . Menopause   . Migraine     Past Surgical History:  Procedure Laterality Date  . APPENDECTOMY    . BREAST EXCISIONAL BIOPSY Right 2019   lipoma  . COLONOSCOPY  09/03/2008   Dr Vira Agar  . COLONOSCOPY WITH PROPOFOL N/A 06/23/2019   Procedure: COLONOSCOPY WITH PROPOFOL;  Surgeon: Jonathon Bellows, MD;  Location: [facility] ENDOSCOPY;  Service: Gastroenterology;  Laterality: N/A;  . laporoscopy    . REPAIR ANKLE LIGAMENT Left 2004 & 2005  . UPPER GI ENDOSCOPY  2009   Dr Vira Agar    Current Meds  Medication Sig  . acyclovir (ZOVIRAX) 400 MG tablet TAKE 1 TABLET (400 MG TOTAL) BY MOUTH 3 (THREE) TIMES DAILY. (Patient taking differently: 3 (three) times daily as needed. TAKE 1 TABLET (400 MG TOTAL) BY MOUTH 3 (THREE) TIMES DAILY.)  . B Complex Vitamins (VITAMIN B COMPLEX) TABS Take 1 tablet by mouth daily.  . Cholecalciferol (VITAMIN D PO) Take 2,000 Units by mouth daily.   . citalopram (CELEXA) 40 MG tablet Take 1.5 tablets (60 mg total) by mouth daily.  . cyclobenzaprine (FLEXERIL) 10 MG tablet Take 1 tablet (10 mg total) by mouth at bedtime.  Eduard Roux (AIMOVIG) 70 MG/ML SOAJ Inject 70 mg into the skin every  30 (thirty) days.  Marland Kitchen Fexofenadine HCl (ALLEGRA PO) Take by mouth daily.  Marland Kitchen omeprazole (PRILOSEC) 20 MG capsule Take 20 mg by mouth daily as needed.   . SUMAtriptan (IMITREX) 100 MG tablet Take 1 tablet (100 mg total) by mouth as needed. May repeat in 2 hours if headache persists or recurs.  Marland Kitchen zolmitriptan (ZOMIG) 5 MG tablet Take 1 tablet (5 mg total) by mouth daily as needed.    Allergies: Patient has no known  allergies.  Social History   Tobacco Use  . Smoking status: Former Smoker    Years: 30.00    Types: Cigarettes  . Smokeless tobacco: Never Used  Substance Use Topics  . Alcohol use: Yes    Alcohol/week: 0.0 standard drinks    Comment: pt states very rare  . Drug use: No    Family History  Problem Relation Age of Onset  . Osteoporosis Mother   . Thyroid disease Mother   . Heart disease Father   . Cancer Sister        endometrial  . Cardiomyopathy Brother   . Cancer Maternal Grandmother        vulvar  . Diabetes Maternal Grandmother   . AAA (abdominal aortic aneurysm) Maternal Grandfather   . Stroke Paternal Grandmother   . Heart disease Paternal Grandfather   . Breast cancer Neg Hx     Review of Systems: A 12-system review of systems was performed and was negative except as noted in the HPI.  --------------------------------------------------------------------------------------------------  Physical Exam: BP 140/88 (BP Location: Right Arm, Patient Position: Sitting, Cuff Size: Normal)   Pulse 62   Ht 5\' 9"  (1.753 m)   Wt 189 lb 4 oz (85.8 kg)   LMP  (LMP Unknown)   SpO2 98%   BMI 27.95 kg/m   General: NAD. HEENT: No conjunctival pallor or scleral icterus. Facemask in place. Neck: Supple without lymphadenopathy, thyromegaly, JVD, or HJR. No carotid bruit. Lungs: Normal work of breathing. Clear to auscultation bilaterally without wheezes or crackles. Heart: Regular rate and rhythm without murmurs, rubs, or gallops. Non-displaced PMI. Abd: Bowel sounds present. Soft, NT/ND without hepatosplenomegaly Ext: No lower extremity edema. Radial, PT, and DP pulses are 2+ bilaterally Skin: Warm and dry without rash. Neuro: CNIII-XII intact. Strength and fine-touch sensation intact in upper and lower extremities bilaterally. Psych: Normal mood and affect.  EKG: Normal sinus rhythm with PAC.  Otherwise, no significant abnormality.  Lab Results  Component Value Date    WBC 8.5 01/12/2020   HGB 15.0 01/12/2020   HCT 42.4 01/12/2020   MCV 90 01/12/2020   PLT 380 01/12/2020    Lab Results  Component Value Date   NA 137 01/12/2020   K 4.1 01/12/2020   CL 99 01/12/2020   CO2 24 01/12/2020   BUN 14 01/12/2020   CREATININE 0.92 01/12/2020   GLUCOSE 89 01/12/2020   ALT 23 01/12/2020    Lab Results  Component Value Date   CHOL 254 (H) 01/12/2020   HDL 40 01/12/2020   LDLCALC 169 (H) 01/12/2020   TRIG 237 (H) 01/12/2020    --------------------------------------------------------------------------------------------------  ASSESSMENT AND PLAN: Unstable angina: Debra Cannon reports a 1.5-week history of substernal chest pressure worsened with activity, accompanied by shortness of breath.  While the duration of her pain is somewhat atypical, she has several cardiac risk factors including hyperlipidemia with a recent LDL of 169, prior tobacco use, and family history of premature coronary artery disease in her father.  Given ongoing  6-7/10 chest pain, I have recommended that Debra Cannon be taken to the emergency department for further evaluation.  If work-up is unrevealing, I would favor expedited pharmacologic myocardial perfusion stress test.  We will give her aspirin 324 mg in the office today.  Hyperlipidemia: LDL moderately elevated on recent check.  We will defer statin therapy for now pending aforementioned work-up.  PACs: Isolated PAC noted on EKG today, which may be responsible for intermittent palpitations reported by Debra Cannon.  Recent labs were notable for normal electrolytes, renal function, and TSH.  No further intervention is recommended at this time.  Follow-up: To be determined based on ED evaluation.  Nelva Bush, MD 01/17/2020 11:50 AM

## 2020-01-17 ENCOUNTER — Emergency Department: Payer: 59

## 2020-01-17 ENCOUNTER — Encounter: Payer: Self-pay | Admitting: Internal Medicine

## 2020-01-17 ENCOUNTER — Ambulatory Visit (INDEPENDENT_AMBULATORY_CARE_PROVIDER_SITE_OTHER): Payer: 59 | Admitting: Internal Medicine

## 2020-01-17 ENCOUNTER — Other Ambulatory Visit: Payer: Self-pay

## 2020-01-17 ENCOUNTER — Emergency Department
Admission: EM | Admit: 2020-01-17 | Discharge: 2020-01-17 | Disposition: A | Discharge status: Home / Self Care | Payer: 59 | Attending: Emergency Medicine | Admitting: Emergency Medicine

## 2020-01-17 VITALS — BP 140/88 | HR 62 | Ht 69.0 in | Wt 189.2 lb

## 2020-01-17 DIAGNOSIS — R0789 Other chest pain: Secondary | ICD-10-CM | POA: Diagnosis not present

## 2020-01-17 DIAGNOSIS — Z79899 Other long term (current) drug therapy: Secondary | ICD-10-CM | POA: Insufficient documentation

## 2020-01-17 DIAGNOSIS — R079 Chest pain, unspecified: Secondary | ICD-10-CM | POA: Diagnosis not present

## 2020-01-17 DIAGNOSIS — Z87891 Personal history of nicotine dependence: Secondary | ICD-10-CM | POA: Diagnosis not present

## 2020-01-17 DIAGNOSIS — R0602 Shortness of breath: Secondary | ICD-10-CM | POA: Diagnosis not present

## 2020-01-17 LAB — BASIC METABOLIC PANEL
Anion gap: 11 (ref 5–15)
BUN: 20 mg/dL (ref 8–23)
CO2: 24 mmol/L (ref 22–32)
Calcium: 9.1 mg/dL (ref 8.9–10.3)
Chloride: 105 mmol/L (ref 98–111)
Creatinine, Ser: 0.78 mg/dL (ref 0.44–1.00)
GFR calc Af Amer: >60 mL/min (ref >60)
GFR calc non Af Amer: >60 mL/min (ref >60)
Glucose, Bld: 104 mg/dL — ABNORMAL HIGH (ref 70–99)
Potassium: 4 mmol/L (ref 3.5–5.1)
Sodium: 140 mmol/L (ref 135–145)

## 2020-01-17 LAB — CBC
HCT: 42.7 % (ref 36.0–46.0)
Hemoglobin: 14.6 g/dL (ref 12.0–15.0)
MCH: 31 pg (ref 26.0–34.0)
MCHC: 34.2 g/dL (ref 30.0–36.0)
MCV: 90.7 fL (ref 80.0–100.0)
Platelets: 350 10*3/uL (ref 150–400)
RBC: 4.71 MIL/uL (ref 3.87–5.11)
RDW: 11.5 % (ref 11.5–15.5)
WBC: 7.8 10*3/uL (ref 4.0–10.5)
nRBC: 0 % (ref 0.0–0.2)

## 2020-01-17 LAB — TROPONIN I (HIGH SENSITIVITY)
Troponin I (High Sensitivity): 3 ng/L (ref <18)
Troponin I (High Sensitivity): 3 ng/L (ref <18)

## 2020-01-17 MED ORDER — KETOROLAC TROMETHAMINE 30 MG/ML IJ SOLN
15.0000 mg | Freq: Once | INTRAMUSCULAR | Status: AC
  Administered 2020-01-17: 17:00:00 15 mg via INTRAVENOUS
  Filled 2020-01-17: qty 1

## 2020-01-17 MED ORDER — LIDOCAINE VISCOUS HCL 2 % MT SOLN: 15.0000 mL | Freq: Once | OROMUCOSAL | Status: DC

## 2020-01-17 MED ORDER — IOHEXOL 350 MG/ML SOLN
75.0000 mL | Freq: Once | INTRAVENOUS | Status: AC | PRN
  Administered 2020-01-17: 75 mL via INTRAVENOUS
  Filled 2020-01-17: qty 75

## 2020-01-17 MED ORDER — ASPIRIN EC 81 MG PO TBEC: 324.0000 mg | DELAYED_RELEASE_TABLET | Freq: Once | ORAL | Status: AC

## 2020-01-17 MED ORDER — NAPROXEN 375 MG PO TABS: 375.0000 mg | ORAL_TABLET | Freq: Two times a day (BID) | ORAL | 0 refills | Status: AC

## 2020-01-17 MED ORDER — PANTOPRAZOLE SODIUM 40 MG PO TBEC: 40.0000 mg | DELAYED_RELEASE_TABLET | Freq: Every day | ORAL | 0 refills | Status: DC

## 2020-01-17 MED ORDER — KETOROLAC TROMETHAMINE 30 MG/ML IJ SOLN: 30.0000 mg | Freq: Once | INTRAMUSCULAR | Status: DC

## 2020-01-17 MED ORDER — ALUM & MAG HYDROXIDE-SIMETH 200-200-20 MG/5ML PO SUSP: 30.0000 mL | Freq: Once | ORAL | Status: DC

## 2020-01-17 NOTE — Patient Instructions (Signed)
To Emergency room for further evaluation.

## 2020-01-17 NOTE — ED Triage Notes (Signed)
Pt comes with chest pain starting on easter. Pt seen by PCP and has not gotten any better. Pt states a pressure on the center of her chest. Denies worsening with breathing.

## 2020-01-17 NOTE — Addendum Note (Signed)
Addended by: Annia Belt on: 01/17/2020 12:37 PM   Modules accepted: Orders

## 2020-01-17 NOTE — Progress Notes (Signed)
Bayer Aspirin 81 mg tablets x4 given to patient at 12:15 in office. LOT # [id number] Exp 11/21

## 2020-01-17 NOTE — Discharge Instructions (Addendum)
I'd recommend starting the prednisone prescribed by your primary.  I'd also take the Mobic, which is similar to a long-lasting Ibuprofen, for the next 5-7 days.  Because prednisone and mobic can be upsetting with your stomach, I'd take an antacid while taking them. I have prescribed pantoprazole, but omeprazole or other over-the-counter antacid would be safe

## 2020-01-17 NOTE — ED Provider Notes (Signed)
[facility] Emergency Department Provider Note  ____________________________________________   First MD Initiated Contact with Patient 01/17/20 1456     (approximate)  I have reviewed the triage vital signs and the nursing notes.   HISTORY  Chief Complaint Chest Pain    HPI Debra Cannon is a 64 y.o. female  With h/o HLD, chest pain here with chest pain. Pt reports that her sx started on easter as a sharp, positional, somewhat pleuritic substernal midline chest pain. She has been seen by her PCP and recently Cardiology for this. Pain has been fairly constant, worse intermittently and w/ positions and deep inspiration. No alleviating factors. No known h/o CAD. She saw Cardiology who sent her here for further evaluation. No fever, chills. No h/o DVT, PE, leg swelling, recent immobilization.        Past Medical History:  Diagnosis Date  . Herpes   . Hypercholesteremia   . Menopause   . Migraine     Patient Active Problem List   Diagnosis Date Noted  . Chest pressure 01/12/2020  . Lipoma of chest wall 01/06/2018  . Major depression in full remission ([place]) 01/03/2018  . Insomnia 11/02/2016  . Herpes simplex 08/30/2015  . Hypercholesteremia 08/30/2015  . Menopause 08/30/2015  . Migraine 08/30/2015    Past Surgical History:  Procedure Laterality Date  . APPENDECTOMY    . BREAST EXCISIONAL BIOPSY Right 2019   lipoma  . COLONOSCOPY  09/03/2008   Dr Vira Agar  . COLONOSCOPY WITH PROPOFOL N/A 06/23/2019   Procedure: COLONOSCOPY WITH PROPOFOL;  Surgeon: Jonathon Bellows, MD;  Location: [facility] ENDOSCOPY;  Service: Gastroenterology;  Laterality: N/A;  . laporoscopy    . REPAIR ANKLE LIGAMENT Left 2004 & 2005  . UPPER GI ENDOSCOPY  2009   Dr Vira Agar    Prior to Admission medications   Medication Sig Start Date End Date Taking? Authorizing Provider  acyclovir (ZOVIRAX) 400 MG tablet TAKE 1 TABLET (400 MG TOTAL) BY MOUTH 3 (THREE) TIMES DAILY. Patient  taking differently: 3 (three) times daily as needed. TAKE 1 TABLET (400 MG TOTAL) BY MOUTH 3 (THREE) TIMES DAILY. 11/30/19   Johnson, Megan P, DO  B Complex Vitamins (VITAMIN B COMPLEX) TABS Take 1 tablet by mouth daily. 05/04/17   [provider]  Cholecalciferol (VITAMIN D PO) Take 2,000 Units by mouth daily.     [provider]  citalopram (CELEXA) 40 MG tablet Take 1.5 tablets (60 mg total) by mouth daily. 11/30/19   Johnson, Megan P, DO  cyclobenzaprine (FLEXERIL) 10 MG tablet Take 1 tablet (10 mg total) by mouth at bedtime. 05/25/19   Johnson, Megan P, DO  Erenumab-aooe (AIMOVIG) 70 MG/ML SOAJ Inject 70 mg into the skin every 30 (thirty) days. 10/24/19   Johnson, Megan P, DO  Fexofenadine HCl (ALLEGRA PO) Take by mouth daily.    [provider]  naproxen (NAPROSYN) 375 MG tablet Take 1 tablet (375 mg total) by mouth 2 (two) times daily with a meal for 7 days. 01/17/20 01/24/20  Duffy Bruce, MD  omeprazole (PRILOSEC) 20 MG capsule Take 20 mg by mouth daily as needed.     [provider]  pantoprazole (PROTONIX) 40 MG tablet Take 1 tablet (40 mg total) by mouth daily for 7 days. 01/17/20 01/24/20  Duffy Bruce, MD  predniSONE (DELTASONE) 10 MG tablet Take 6 tablets by mouth daily for 2 days, then reduce by 1 tablet every 2 days until gone Patient not taking: Reported on  01/17/2020 01/16/20   Cannady, Henrine Screws T, NP  SUMAtriptan (IMITREX) 100 MG tablet Take 1 tablet (100 mg total) by mouth as needed. May repeat in 2 hours if headache persists or recurs. 11/30/19   Johnson, Megan P, DO  zolmitriptan (ZOMIG) 5 MG tablet Take 1 tablet (5 mg total) by mouth daily as needed. 11/30/19   Valerie Roys, DO    Allergies Patient has no known allergies.  Family History  Problem Relation Age of Onset  . Osteoporosis Mother   . Thyroid disease Mother   . Heart disease Father   . Heart attack Father 75  . Cancer Sister        endometrial  . Cardiomyopathy Brother   .  Cancer Maternal Grandmother        vulvar  . Diabetes Maternal Grandmother   . AAA (abdominal aortic aneurysm) Maternal Grandfather   . Stroke Paternal Grandmother   . Heart disease Paternal Grandfather   . Breast cancer Neg Hx     Social History Social History   Tobacco Use  . Smoking status: Former Smoker    Packs/day: 1.00    Years: 20.00    Pack years: 20.00    Types: Cigarettes    Quit date: 2000    Years since quitting: 21.2  . Smokeless tobacco: Never Used  Substance Use Topics  . Alcohol use: Not Currently    Alcohol/week: 0.0 standard drinks    Comment: 2 drinks per year  . Drug use: No    Review of Systems  Review of Systems  Constitutional: Negative for fatigue and fever.  HENT: Negative for congestion and sore throat.   Eyes: Negative for visual disturbance.  Respiratory: Negative for cough and shortness of breath.   Cardiovascular: Positive for chest pain.  Gastrointestinal: Negative for abdominal pain, diarrhea, nausea and vomiting.  Genitourinary: Negative for flank pain.  Musculoskeletal: Negative for back pain and neck pain.  Skin: Negative for rash and wound.  Neurological: Negative for weakness.  All other systems reviewed and are negative.    ____________________________________________  PHYSICAL EXAM:      VITAL SIGNS: ED Triage Vitals [01/17/20 1231]  Enc Vitals Group     BP (!) 146/70     Pulse Rate 64     Resp 18     Temp 98.7 F (37.1 C)     Temp Source Oral     SpO2 96 %     Weight 187 lb 6.3 oz (85 kg)     Height 5\' 9"  (1.753 m)     Head Circumference      Peak Flow      Pain Score 8     Pain Loc      Pain Edu?      Excl. in [place]?      Physical Exam Vitals and nursing note reviewed.  Constitutional:      General: She is not in acute distress.    Appearance: She is well-developed.  HENT:     Head: Normocephalic and atraumatic.  Eyes:     Conjunctiva/sclera: Conjunctivae normal.  Cardiovascular:     Rate and Rhythm:  Normal rate and regular rhythm.     Heart sounds: Normal heart sounds. No murmur. No friction rub.  Pulmonary:     Effort: Pulmonary effort is normal. No respiratory distress.     Breath sounds: Normal breath sounds. No decreased breath sounds, wheezing or rales.  Abdominal:     General: There is  no distension.     Palpations: Abdomen is soft.     Tenderness: There is no abdominal tenderness.  Musculoskeletal:     Cervical back: Neck supple.  Skin:    General: Skin is warm.     Capillary Refill: Capillary refill takes less than 2 seconds.  Neurological:     Mental Status: She is alert and oriented to person, place, and time.     Motor: No abnormal muscle tone.       ____________________________________________   LABS (all labs ordered are listed, but only abnormal results are displayed)  Labs Reviewed  BASIC METABOLIC PANEL - Abnormal; Notable for the following components:      Result Value   Glucose, Bld 104 (*)    All other components within normal limits  CBC  TROPONIN I (HIGH SENSITIVITY)  TROPONIN I (HIGH SENSITIVITY)    ____________________________________________  EKG: Normal sinus rhythm, ventricular rate 63.  PR 178, QRS 84, QTc 419.  No acute ST elevations or depressions.  No ischemia or infarct. ________________________________________  RADIOLOGY All imaging, including plain films, CT scans, and ultrasounds, independently reviewed by me, and interpretations confirmed via formal radiology reads.  ED MD interpretation:   CT angio: Clear, no PE, no significant coronary disease Chest x-ray: No pneumonia or abnormality  Official radiology report(s): DG Chest 2 View  Result Date: 01/17/2020 CLINICAL DATA:  Chest pain. EXAM: CHEST - 2 VIEW COMPARISON:  Chest radiograph 01/12/2020. FINDINGS: Heart size within normal limits. There is no evidence of airspace consolidation within the lungs. No evidence of pleural effusion or pneumothorax. No acute bony abnormality.  Thoracic spondylosis. IMPRESSION: No evidence of acute cardiopulmonary abnormality. Electronically Signed   By: Kellie Simmering DO   On: 01/17/2020 13:34   CT Angio Chest PE W and/or Wo Contrast  Result Date: 01/17/2020 CLINICAL DATA:  Shortness of breath EXAM: CT ANGIOGRAPHY CHEST WITH CONTRAST TECHNIQUE: Multidetector CT imaging of the chest was performed using the standard protocol during bolus administration of intravenous contrast. Multiplanar CT image reconstructions and MIPs were obtained to evaluate the vascular anatomy. CONTRAST:  48mL OMNIPAQUE IOHEXOL 350 MG/ML SOLN COMPARISON:  None. FINDINGS: Cardiovascular: Satisfactory opacification of the pulmonary arteries to the segmental level. No evidence of pulmonary embolism. Normal heart size. No pericardial effusion. Mild coronary artery and thoracic aortic calcification. Mediastinum/Nodes: No enlarged lymph nodes identified. Thyroid and esophagus unremarkable. There is a small right posterior diverticulum of the trachea. Lungs/Pleura: There is no consolidation. Mild patchy atelectasis. A 4 mm triangular nodule along the left oblique fissure likely reflects an intrapulmonary node. Upper Abdomen: No acute abnormality. Musculoskeletal: No chest wall abnormality. No acute osseous findings. Review of the MIP images confirms the above findings. IMPRESSION: No acute pulmonary embolism or other significant acute abnormality. Electronically Signed   By: Macy Mis M.D.   On: 01/17/2020 16:57    ____________________________________________  PROCEDURES   Procedure(s) performed (including Critical Care):  Procedures  ____________________________________________  INITIAL IMPRESSION / MDM / [place] / ED COURSE  As part of my medical decision making, I reviewed the following data within the [place] notes reviewed and incorporated, Old chart reviewed, Notes from prior ED visits, and [place] Controlled Substance  [place] was evaluated in Emergency Department on 01/17/2020 for the symptoms described in the history of present illness. She was evaluated in the context of the global COVID-19 pandemic, which necessitated consideration that the patient  might be at risk for infection with the SARS-CoV-2 virus that causes COVID-19. Institutional protocols and algorithms that pertain to the evaluation of patients at risk for COVID-19 are in a state of rapid change based on information released by regulatory bodies including the CDC and federal and state organizations. These policies and algorithms were followed during the patient's care in the ED.  Some ED evaluations and interventions may be delayed as a result of limited staffing during the pandemic.*     Medical Decision Making: 64 year old female here with ongoing chest pain for the last week.  Pain is somewhat positional and reproducible with deep inspiration.  Suspect musculoskeletal chest wall pain versus primary pleurisy.  Given persistence of her symptoms, CT angio obtained and fortunately shows no evidence of PE, pericardial effusion, or other etiology for her pain.  Troponins are negative x2 with constant symptoms for the last week, making ACS unlikely.  She did see Dr. Saunders Revel today, and I have discussed with him.  Given that her pain is significantly improved with Toradol, feel she can be reasonably managed as an outpatient with outpatient stress test, which he will help arrange.  In the meantime, will treat for likely musculoskeletal pain, with Mobic.  Will place her on an antacid for GI prophylaxis.  Return precautions given.  ____________________________________________  FINAL CLINICAL IMPRESSION(S) / ED DIAGNOSES  Final diagnoses:  Atypical chest pain     MEDICATIONS GIVEN DURING THIS VISIT:  Medications  ketorolac (TORADOL) 30 MG/ML injection 15 mg (15 mg Intravenous Given 01/17/20 1645)  iohexol (OMNIPAQUE) 350 MG/ML  injection 75 mL (75 mLs Intravenous Contrast Given 01/17/20 1635)     ED Discharge Orders         Ordered    pantoprazole (PROTONIX) 40 MG tablet  Daily     01/17/20 1715    naproxen (NAPROSYN) 375 MG tablet  2 times daily with meals     01/17/20 1715           Note:  This document was prepared using Dragon voice recognition software and may include unintentional dictation errors.   Duffy Bruce, MD 01/17/20 1742

## 2020-01-18 ENCOUNTER — Telehealth: Payer: Self-pay | Admitting: *Deleted

## 2020-01-18 DIAGNOSIS — R079 Chest pain, unspecified: Secondary | ICD-10-CM

## 2020-01-18 MED ORDER — ASPIRIN EC 81 MG PO TBEC: 81.0000 mg | DELAYED_RELEASE_TABLET | Freq: Every day | ORAL | 3 refills | Status: DC

## 2020-01-18 NOTE — Telephone Encounter (Signed)
Called patient. She is agreeable to plan and verbalized understanding to start Aspirin 81 mg by mouth daily and of the below instructions. Sent to precert and to MyChart as well.   [place]  Your caregiver has ordered a Stress Test with nuclear imaging. The purpose of this test is to evaluate the blood supply to your heart muscle. This procedure is referred to as a "Non-Invasive Stress Test." This is because other than having an IV started in your vein, nothing is inserted or "invades" your body. Cardiac stress tests are done to find areas of poor blood flow to the heart by determining the extent of coronary artery disease (CAD). Some patients exercise on a treadmill, which naturally increases the blood flow to your heart, while others who are  unable to walk on a treadmill due to physical limitations have a pharmacologic/chemical stress agent called Lexiscan . This medicine will mimic walking on a treadmill by temporarily increasing your coronary blood flow.   Please note: these test may take anywhere between 2-4 hours to complete  PLEASE REPORT TO [facility] AT THE FIRST DESK WILL DIRECT YOU WHERE TO GO  Date of Procedure:_______04/19/21___________  Arrival Time for Procedure:_______08:15 am________   PLEASE NOTIFY THE OFFICE AT LEAST 24 HOURS IN ADVANCE IF YOU ARE UNABLE TO KEEP YOUR APPOINTMENT.  [phone] AND  PLEASE NOTIFY NUCLEAR MEDICINE AT [facility] AT LEAST 24 HOURS IN ADVANCE IF YOU ARE UNABLE TO KEEP YOUR APPOINTMENT. [phone]  How to prepare for your Myoview test:  1. Do not eat or drink after midnight 2. No caffeine for 24 hours prior to test 3. No smoking 24 hours prior to test. 4. Your medication may be taken with water.  If your doctor stopped a medication because of this test, do not take that medication. 5. Ladies, please do not wear dresses.  Skirts or pants are appropriate. Please wear a short sleeve shirt. 6. No  perfume, cologne or lotion. 7. Wear comfortable walking shoes. No heels!

## 2020-01-18 NOTE — Telephone Encounter (Signed)
End, Harrell Gave, MD  Rheya Minogue, Eliberto Ivory, RN  Looks like ED w/u was normal. Could you have her start taking aspirin 81 mg daily and try to set her up for a Lexiscan Myoview in the next week? Thanks.   Gerald Stabs

## 2020-01-22 ENCOUNTER — Other Ambulatory Visit: Payer: Self-pay

## 2020-01-22 ENCOUNTER — Ambulatory Visit
Admission: RE | Admit: 2020-01-22 | Discharge: 2020-01-22 | Disposition: A | Discharge status: Home / Self Care | Payer: 59 | Source: Ambulatory Visit | Attending: Internal Medicine | Admitting: Internal Medicine

## 2020-01-22 ENCOUNTER — Telehealth: Payer: Self-pay | Admitting: *Deleted

## 2020-01-22 DIAGNOSIS — R079 Chest pain, unspecified: Secondary | ICD-10-CM | POA: Insufficient documentation

## 2020-01-22 LAB — NM MYOCAR MULTI W/SPECT W/WALL MOTION / EF
LV dias vol: 76 mL (ref 46–106)
LV sys vol: 29 mL
Peak HR: 95 {beats}/min
Percent HR: 59 %
Rest HR: 62 {beats}/min
SDS: 2
SRS: 2
SSS: 0
TID: 0.92

## 2020-01-22 MED ORDER — REGADENOSON 0.4 MG/5ML IV SOLN
0.4000 mg | Freq: Once | INTRAVENOUS | Status: AC
  Administered 2020-01-22: 0.4 mg via INTRAVENOUS

## 2020-01-22 MED ORDER — TECHNETIUM TC 99M TETROFOSMIN IV KIT
10.3700 | PACK | Freq: Once | INTRAVENOUS | Status: AC | PRN
  Administered 2020-01-22: 08:00:00 10.37 via INTRAVENOUS

## 2020-01-22 MED ORDER — TECHNETIUM TC 99M TETROFOSMIN IV KIT
30.0000 | PACK | Freq: Once | INTRAVENOUS | Status: AC | PRN
  Administered 2020-01-22: 31.298 via INTRAVENOUS

## 2020-01-22 NOTE — Telephone Encounter (Signed)
Pt has reviewed results in My Chart.

## 2020-01-22 NOTE — Telephone Encounter (Signed)
Left detailed message with results, to call and schedule f/u appt and to check MyChart, ok per DPR, and to call back if any questions.

## 2020-01-22 NOTE — Telephone Encounter (Signed)
-----   Message from Nelva Bush, MD sent at 01/22/2020 11:58 AM EDT ----- Please let Debra Cannon know that her stress test is normal.  I recommend that she use as needed acetaminophen or NSAID and also try an over-the-counter antacid such as famotidine or omeprazole for component of GERD.  She should follow-up with me or an APP in approximately 3 to 4 weeks.

## 2020-01-26 ENCOUNTER — Ambulatory Visit: Payer: 59 | Admitting: Internal Medicine

## 2020-02-29 ENCOUNTER — Telehealth: Payer: Self-pay

## 2020-02-29 NOTE — Telephone Encounter (Signed)
Went to submit PA for Aimovig 70mg /ml via cover my meds, response states  "Prior Authorization is not required for this medication dosage form and strength at the quantity and days supply requested."

## 2020-05-30 ENCOUNTER — Ambulatory Visit (INDEPENDENT_AMBULATORY_CARE_PROVIDER_SITE_OTHER): Payer: 59 | Admitting: Family Medicine

## 2020-05-30 ENCOUNTER — Other Ambulatory Visit: Payer: Self-pay

## 2020-05-30 ENCOUNTER — Other Ambulatory Visit: Payer: Self-pay | Admitting: Family Medicine

## 2020-05-30 ENCOUNTER — Encounter: Payer: Self-pay | Admitting: Family Medicine

## 2020-05-30 VITALS — BP 123/76 | HR 65 | Temp 99.2°F | Ht 68.0 in | Wt 187.2 lb

## 2020-05-30 DIAGNOSIS — B009 Herpesviral infection, unspecified: Secondary | ICD-10-CM | POA: Diagnosis not present

## 2020-05-30 DIAGNOSIS — E7800 Pure hypercholesterolemia, unspecified: Secondary | ICD-10-CM | POA: Diagnosis not present

## 2020-05-30 DIAGNOSIS — Z1231 Encounter for screening mammogram for malignant neoplasm of breast: Secondary | ICD-10-CM | POA: Diagnosis not present

## 2020-05-30 DIAGNOSIS — F325 Major depressive disorder, single episode, in full remission: Secondary | ICD-10-CM | POA: Diagnosis not present

## 2020-05-30 DIAGNOSIS — G43909 Migraine, unspecified, not intractable, without status migrainosus: Secondary | ICD-10-CM

## 2020-05-30 DIAGNOSIS — Z0000 Encounter for general adult medical examination without abnormal findings: Secondary | ICD-10-CM | POA: Diagnosis not present

## 2020-05-30 LAB — URINALYSIS, ROUTINE W REFLEX MICROSCOPIC
Bilirubin, UA: NEGATIVE
Glucose, UA: NEGATIVE
Ketones, UA: NEGATIVE
Leukocytes,UA: NEGATIVE
Nitrite, UA: NEGATIVE
Protein,UA: NEGATIVE
RBC, UA: NEGATIVE
Specific Gravity, UA: <1.005 — ABNORMAL LOW (ref 1.005–1.030)
Urobilinogen, Ur: 0.2 mg/dL (ref 0.2–1.0)
pH, UA: 5 (ref 5.0–7.5)

## 2020-05-30 MED ORDER — CYCLOBENZAPRINE HCL 10 MG PO TABS: 10.0000 mg | ORAL_TABLET | Freq: Every day | ORAL | 0 refills | Status: DC

## 2020-05-30 MED ORDER — ACYCLOVIR 400 MG PO TABS: ORAL_TABLET | ORAL | 2 refills | Status: DC

## 2020-05-30 MED ORDER — CITALOPRAM HYDROBROMIDE 40 MG PO TABS: 40.0000 mg | ORAL_TABLET | Freq: Every day | ORAL | 1 refills | Status: DC

## 2020-05-30 NOTE — Assessment & Plan Note (Signed)
Under good control on current regimen. Continue current regimen. Continue to monitor. Call with any concerns. Refills given.   

## 2020-05-30 NOTE — Progress Notes (Signed)
BP 123/76 (BP Location: Right Arm, Patient Position: Sitting, Cuff Size: Normal)   Pulse 65   Temp 99.2 F (37.3 C) (Oral)   Ht 5\' 8"  (1.727 m)   Wt 187 lb 3.2 oz (84.9 kg)   LMP  (LMP Unknown)   SpO2 99%   BMI 28.46 kg/m    Subjective:    Patient ID: Debra Cannon, female    DOB: 25-Jul-1956, 64 y.o.   MRN: [id number]  HPI: Debra Cannon is a 64 y.o. female presenting on 05/30/2020 for comprehensive medical examination. Current medical complaints include:  DEPRESSION Mood status: better Satisfied with current treatment?: yes Symptom severity: mild  Duration of current treatment : chronic Side effects: no Medication compliance: excellent compliance Psychotherapy/counseling: no  Previous psychiatric medications: celexa Depressed mood: no Anxious mood: no Anhedonia: no Significant weight loss or gain: no Insomnia: no  Fatigue: no Feelings of worthlessness or guilt: no Impaired concentration/indecisiveness: no Suicidal ideations: no Hopelessness: no Crying spells: no Depression screen [facility] 2/9 05/30/2020 11/30/2019 05/25/2019 10/28/2018 09/15/2018  Decreased Interest 0 1 1 0 1  Down, Depressed, Hopeless 0 0 1 1 1   PHQ - 2 Score 0 1 2 1 2   Altered sleeping 0 1 1 1 1   Tired, decreased energy 1 1 1 1 1   Change in appetite 0 0 0 0 0  Feeling bad or failure about yourself  0 0 1 1 1   Trouble concentrating 1 1 2  0 0  Moving slowly or fidgety/restless 0 0 0 0 0  Suicidal thoughts 0 0 0 0 0  PHQ-9 Score 2 4 7 4 5   Difficult doing work/chores - - Very difficult Not difficult at all Somewhat difficult  Some recent data might be hidden   HYPERLIPIDEMIA Hyperlipidemia status: status Satisfied with current treatment?  yes Side effects:  Not on anything Supplements: none Aspirin:  no Debra 10-year ASCVD risk score [name], et al., 2013) is: 5.3%   Values used to calculate Debra score:     Age: 56 years     Sex: Female     Is Non-Hispanic African American: No     Diabetic:  No     Tobacco smoker: No     Systolic Blood Pressure: 757 mmHg     Is BP treated: No     HDL Cholesterol: 40 mg/dL     Total Cholesterol: 254 mg/dL Chest pain:  no Coronary artery disease:  no  Menopausal Symptoms: no  Depression Screen done today and results listed below:  Depression screen [facility] 2/9 05/30/2020 11/30/2019 05/25/2019 10/28/2018 09/15/2018  Decreased Interest 0 1 1 0 1  Down, Depressed, Hopeless 0 0 1 1 1   PHQ - 2 Score 0 1 2 1 2   Altered sleeping 0 1 1 1 1   Tired, decreased energy 1 1 1 1 1   Change in appetite 0 0 0 0 0  Feeling bad or failure about yourself  0 0 1 1 1   Trouble concentrating 1 1 2  0 0  Moving slowly or fidgety/restless 0 0 0 0 0  Suicidal thoughts 0 0 0 0 0  PHQ-9 Score 2 4 7 4 5   Difficult doing work/chores - - Very difficult Not difficult at all Somewhat difficult  Some recent data might be hidden    Past Medical History:  Past Medical History:  Diagnosis Date  . Herpes   . Hypercholesteremia   . Menopause   . Migraine     Surgical  History:  Past Surgical History:  Procedure Laterality Date  . APPENDECTOMY    . BREAST EXCISIONAL BIOPSY Right 2019   lipoma  . COLONOSCOPY  09/03/2008   Dr Vira Agar  . COLONOSCOPY WITH PROPOFOL N/A 06/23/2019   Procedure: COLONOSCOPY WITH PROPOFOL;  Surgeon: Jonathon Bellows, MD;  Location: [facility] ENDOSCOPY;  Service: Gastroenterology;  Laterality: N/A;  . laporoscopy    . REPAIR ANKLE LIGAMENT Left 2004 & 2005  . UPPER GI ENDOSCOPY  2009   Dr Vira Agar    Medications:  Current Outpatient Medications on File Prior to Visit  Medication Sig  . aspirin EC 81 MG tablet Take 1 tablet (81 mg total) by mouth daily.  . B Complex Vitamins (VITAMIN B COMPLEX) TABS Take 1 tablet by mouth daily.  . Cholecalciferol (VITAMIN D PO) Take 2,000 Units by mouth daily.   Eduard Roux (AIMOVIG) 70 MG/ML SOAJ Inject 70 mg into Debra skin every 30 (thirty) days.  Marland Kitchen Fexofenadine HCl (ALLEGRA PO) Take by mouth daily.  Marland Kitchen omeprazole  (PRILOSEC) 20 MG capsule Take 20 mg by mouth daily as needed.   . SUMAtriptan (IMITREX) 100 MG tablet Take 1 tablet (100 mg total) by mouth as needed. May repeat in 2 hours if headache persists or recurs.  Marland Kitchen zolmitriptan (ZOMIG) 5 MG tablet Take 1 tablet (5 mg total) by mouth daily as needed.   Current Facility-Administered Medications on File Prior to Visit  Medication  . aspirin EC tablet 324 mg    Allergies:  No Known Allergies  Social History:  Social History   Socioeconomic History  . Marital status: Married    Spouse name: Not on file  . Number of children: Not on file  . Years of education: Not on file  . Highest education level: Not on file  Occupational History  . Not on file  Tobacco Use  . Smoking status: Former Smoker    Packs/day: 1.00    Years: 20.00    Pack years: 20.00    Types: Cigarettes    Quit date: 2000    Years since quitting: 21.6  . Smokeless tobacco: Never Used  Vaping Use  . Vaping Use: Never used  Substance and Sexual Activity  . Alcohol use: Not Currently    Alcohol/week: 0.0 standard drinks    Comment: 2 drinks per year  . Drug use: No  . Sexual activity: Yes  Other Topics Concern  . Not on file  Social History Narrative  . Not on file   Social Determinants of Health   Financial Resource Strain:   . Difficulty of Paying Living Expenses: Not on file  Food Insecurity:   . Worried About Charity fundraiser in [place] Last Year: Not on file  . Ran Out of Food in [place] Last Year: Not on file  Transportation Needs:   . Lack of Transportation (Medical): Not on file  . Lack of Transportation (Non-Medical): Not on file  Physical Activity:   . Days of Exercise per Week: Not on file  . Minutes of Exercise per Session: Not on file  Stress:   . Feeling of Stress : Not on file  Social Connections:   . Frequency of Communication with Friends and Family: Not on file  . Frequency of Social Gatherings with Friends and Family: Not on file  . Attends  Religious Services: Not on file  . Active Member of Clubs or Organizations: Not on file  . Attends Archivist Meetings: Not on file  .  Marital Status: Not on file  Intimate Partner Violence:   . Fear of Current or Ex-Partner: Not on file  . Emotionally Abused: Not on file  . Physically Abused: Not on file  . Sexually Abused: Not on file   Social History   Tobacco Use  Smoking Status Former Smoker  . Packs/day: 1.00  . Years: 20.00  . Pack years: 20.00  . Types: Cigarettes  . Quit date: 2000  . Years since quitting: 21.6  Smokeless Tobacco Never Used   Social History   Substance and Sexual Activity  Alcohol Use Not Currently  . Alcohol/week: 0.0 standard drinks   Comment: 2 drinks per year    Family History:  Family History  Problem Relation Age of Onset  . Osteoporosis Mother   . Thyroid disease Mother   . Heart disease Father   . Heart attack Father 49  . Cancer Sister        endometrial  . Cardiomyopathy Brother   . Cancer Maternal Grandmother        vulvar  . Diabetes Maternal Grandmother   . AAA (abdominal aortic aneurysm) Maternal Grandfather   . Stroke Paternal Grandmother   . Heart disease Paternal Grandfather   . Breast cancer Neg Hx     Past medical history, surgical history, medications, allergies, family history and social history reviewed with patient today and changes made to appropriate areas of Debra chart.   Review of Systems  Constitutional: Negative.   HENT: Positive for congestion. Negative for ear discharge, ear pain, hearing loss, nosebleeds, sinus pain and sore throat.   Eyes: Negative.   Respiratory: Negative.  Negative for stridor.   Cardiovascular: Negative.   Gastrointestinal: Positive for heartburn. Negative for abdominal pain, blood in stool, constipation, diarrhea, melena, nausea and vomiting.  Genitourinary: Negative.   Musculoskeletal: Positive for myalgias. Negative for back pain, falls, joint pain and neck pain.   Skin: Negative.   Neurological: Positive for tingling and headaches. Negative for dizziness, tremors, sensory change, speech change, focal weakness, seizures, loss of consciousness and weakness.  Endo/Heme/Allergies: Positive for environmental allergies. Negative for polydipsia. Does not bruise/bleed easily.  Psychiatric/Behavioral: Negative.     All other ROS negative except what is listed above and in Debra HPI.      Objective:    BP 123/76 (BP Location: Right Arm, Patient Position: Sitting, Cuff Size: Normal)   Pulse 65   Temp 99.2 F (37.3 C) (Oral)   Ht 5\' 8"  (1.727 m)   Wt 187 lb 3.2 oz (84.9 kg)   LMP  (LMP Unknown)   SpO2 99%   BMI 28.46 kg/m   Wt Readings from Last 3 Encounters:  05/30/20 187 lb 3.2 oz (84.9 kg)  01/17/20 187 lb 6.3 oz (85 kg)  01/17/20 189 lb 4 oz (85.8 kg)    Physical Exam Vitals and nursing note reviewed.  Constitutional:      General: She is not in acute distress.    Appearance: Normal appearance. She is not ill-appearing, toxic-appearing or diaphoretic.  HENT:     Head: Normocephalic and atraumatic.     Right Ear: Tympanic membrane, ear canal and external ear normal. There is no impacted cerumen.     Left Ear: Tympanic membrane, ear canal and external ear normal. There is no impacted cerumen.     Nose: Nose normal. No congestion or rhinorrhea.     Mouth/Throat:     Mouth: Mucous membranes are moist.     Pharynx: Oropharynx  is clear. No oropharyngeal exudate or posterior oropharyngeal erythema.  Eyes:     General: No scleral icterus.       Right eye: No discharge.        Left eye: No discharge.     Extraocular Movements: Extraocular movements intact.     Conjunctiva/sclera: Conjunctivae normal.     Pupils: Pupils are equal, round, and reactive to light.  Neck:     Vascular: No carotid bruit.  Cardiovascular:     Rate and Rhythm: Normal rate and regular rhythm.     Pulses: Normal pulses.     Heart sounds: No murmur heard.  No friction  rub. No gallop.   Pulmonary:     Effort: Pulmonary effort is normal. No respiratory distress.     Breath sounds: Normal breath sounds. No stridor. No wheezing, rhonchi or rales.  Chest:     Chest wall: No tenderness.  Abdominal:     General: Abdomen is flat. Bowel sounds are normal. There is no distension.     Palpations: Abdomen is soft. There is no mass.     Tenderness: There is no abdominal tenderness. There is no right CVA tenderness, left CVA tenderness, guarding or rebound.     Hernia: No hernia is present.  Genitourinary:    Comments: Breast and pelvic exams deferred with shared decision making Musculoskeletal:        General: No swelling, tenderness, deformity or signs of injury.     Cervical back: Normal range of motion and neck supple. No rigidity. No muscular tenderness.     Right lower leg: No edema.     Left lower leg: No edema.  Lymphadenopathy:     Cervical: No cervical adenopathy.  Skin:    General: Skin is warm and dry.     Capillary Refill: Capillary refill takes less than 2 seconds.     Coloration: Skin is not jaundiced or pale.     Findings: No bruising, erythema, lesion or rash.  Neurological:     General: No focal deficit present.     Mental Status: She is alert and oriented to person, place, and time. Mental status is at baseline.     Cranial Nerves: No cranial nerve deficit.     Sensory: No sensory deficit.     Motor: No weakness.     Coordination: Coordination normal.     Gait: Gait normal.     Deep Tendon Reflexes: Reflexes normal.  Psychiatric:        Mood and Affect: Mood normal.        Behavior: Behavior normal.        Thought Content: Thought content normal.        Judgment: Judgment normal.     Results for orders placed or performed during Debra hospital encounter of 01/22/20  NM Myocar Multi W/Spect W/Wall Motion / EF  Result Value Ref Range   Rest HR 62 bpm   Rest BP 142/81 mmHg   Percent HR 59 %   Peak HR 95 bpm   Peak BP 147/77 mmHg    SSS 0    SRS 2    SDS 2    TID 0.92    LV sys vol 29 mL   LV dias vol 76 46 - 106 mL      Assessment & Plan:   Problem List Items Addressed This Visit      Cardiovascular and Mediastinum   Migraine    Under good control on  current regimen. Continue current regimen. Continue to monitor. Call with any concerns. Refills given.       Relevant Medications   citalopram (CELEXA) 40 MG tablet   cyclobenzaprine (FLEXERIL) 10 MG tablet     Other   Herpes simplex    Under good control on current regimen. Continue current regimen. Continue to monitor. Call with any concerns. Refills given.        Relevant Medications   acyclovir (ZOVIRAX) 400 MG tablet   Hypercholesteremia    Rechecking labs today. Await results. Treat as needed.       Relevant Orders   Lipid Panel w/o Chol/HDL Ratio   Major depression in full remission ([place])    Under good control on current regimen. Continue current regimen. Continue to monitor. Call with any concerns. Refills given.        Relevant Medications   citalopram (CELEXA) 40 MG tablet   Other Relevant Orders   TSH    Other Visit Diagnoses    Routine general medical examination at a health care facility    -  Primary   Vaccines up to date. Screening labs checked today. Pap, mammo, colonoscopy up to date. Continue diet and exercise. Call with any concerns.    Relevant Orders   CBC with Differential/Platelet   Comprehensive metabolic panel   Lipid Panel w/o Chol/HDL Ratio   TSH   Urinalysis, Routine w reflex microscopic   Encounter for screening mammogram for malignant neoplasm of breast       Mammogram ordered today.   Relevant Orders   MM 3D SCREEN BREAST BILATERAL       Follow up plan: Return in about 6 months (around 11/30/2020).   LABORATORY TESTING:  - Pap smear: up to date  IMMUNIZATIONS:   - Tdap: Tetanus vaccination status reviewed: last tetanus booster within 10 years. - Influenza: Given elsewhere - Pneumovax: Not  applicable - COVID: Up to date  SCREENING: -Mammogram: Up to date  - Colonoscopy: Up to date  - Bone Density: Not applicable   PATIENT COUNSELING:   Advised to take 1 mg of folate supplement per day if capable of pregnancy.   Sexuality: Discussed sexually transmitted diseases, partner selection, use of condoms, avoidance of unintended pregnancy  and contraceptive alternatives.   Advised to avoid cigarette smoking.  I discussed with Debra patient that most people either abstain from alcohol or drink within safe limits (<=14/week and <=4 drinks/occasion for males, <=7/weeks and <= 3 drinks/occasion for females) and that Debra risk for alcohol disorders and other health effects rises proportionally with Debra number of drinks per week and how often a drinker exceeds daily limits.  Discussed cessation/primary prevention of drug use and availability of treatment for abuse.   Diet: Encouraged to adjust caloric intake to maintain  or achieve ideal body weight, to reduce intake of dietary saturated fat and total fat, to limit sodium intake by avoiding high sodium foods and not adding table salt, and to maintain adequate dietary potassium and calcium preferably from fresh fruits, vegetables, and low-fat dairy products.    stressed Debra importance of regular exercise  Injury prevention: Discussed safety belts, safety helmets, smoke detector, smoking near bedding or upholstery.   Dental health: Discussed importance of regular tooth brushing, flossing, and dental visits.    NEXT PREVENTATIVE PHYSICAL DUE IN 1 YEAR. Return in about 6 months (around 11/30/2020).

## 2020-05-30 NOTE — Assessment & Plan Note (Signed)
Rechecking labs today. Await results. Treat as needed.  °

## 2020-05-31 LAB — COMPREHENSIVE METABOLIC PANEL
ALT: 13 IU/L (ref 0–32)
AST: 15 IU/L (ref 0–40)
Albumin/Globulin Ratio: 2.7 — ABNORMAL HIGH (ref 1.2–2.2)
Albumin: 4.6 g/dL (ref 3.8–4.8)
Alkaline Phosphatase: 97 IU/L (ref 48–121)
BUN/Creatinine Ratio: 17 (ref 12–28)
BUN: 15 mg/dL (ref 8–27)
Bilirubin Total: 0.5 mg/dL (ref 0.0–1.2)
CO2: 25 mmol/L (ref 20–29)
Calcium: 9.3 mg/dL (ref 8.7–10.3)
Chloride: 101 mmol/L (ref 96–106)
Creatinine, Ser: 0.87 mg/dL (ref 0.57–1.00)
GFR calc Af Amer: 83 mL/min/{1.73_m2} (ref >59)
GFR calc non Af Amer: 72 mL/min/{1.73_m2} (ref >59)
Globulin, Total: 1.7 g/dL (ref 1.5–4.5)
Glucose: 87 mg/dL (ref 65–99)
Potassium: 4.3 mmol/L (ref 3.5–5.2)
Sodium: 135 mmol/L (ref 134–144)
Total Protein: 6.3 g/dL (ref 6.0–8.5)

## 2020-05-31 LAB — CBC WITH DIFFERENTIAL/PLATELET
Basophils Absolute: 0 10*3/uL (ref 0.0–0.2)
Basos: 0 %
EOS (ABSOLUTE): 0.2 10*3/uL (ref 0.0–0.4)
Eos: 3 %
Hematocrit: 39.4 % (ref 34.0–46.6)
Hemoglobin: 13.7 g/dL (ref 11.1–15.9)
Immature Grans (Abs): 0 10*3/uL (ref 0.0–0.1)
Immature Granulocytes: 1 %
Lymphocytes Absolute: 2.2 10*3/uL (ref 0.7–3.1)
Lymphs: 28 %
MCH: 31.4 pg (ref 26.6–33.0)
MCHC: 34.8 g/dL (ref 31.5–35.7)
MCV: 90 fL (ref 79–97)
Monocytes Absolute: 0.7 10*3/uL (ref 0.1–0.9)
Monocytes: 8 %
Neutrophils Absolute: 4.6 10*3/uL (ref 1.4–7.0)
Neutrophils: 60 %
Platelets: 328 10*3/uL (ref 150–450)
RBC: 4.37 x10E6/uL (ref 3.77–5.28)
RDW: 12 % (ref 11.7–15.4)
WBC: 7.8 10*3/uL (ref 3.4–10.8)

## 2020-05-31 LAB — LIPID PANEL W/O CHOL/HDL RATIO
Cholesterol, Total: 216 mg/dL — ABNORMAL HIGH (ref 100–199)
HDL: 35 mg/dL — ABNORMAL LOW (ref >39)
LDL Chol Calc (NIH): 130 mg/dL — ABNORMAL HIGH (ref 0–99)
Triglycerides: 283 mg/dL — ABNORMAL HIGH (ref 0–149)
VLDL Cholesterol Cal: 51 mg/dL — ABNORMAL HIGH (ref 5–40)

## 2020-05-31 LAB — TSH: TSH: 1.06 u[IU]/mL (ref 0.450–4.500)

## 2020-06-12 ENCOUNTER — Encounter: Payer: Self-pay | Admitting: Family Medicine

## 2020-06-14 ENCOUNTER — Other Ambulatory Visit: Payer: Self-pay

## 2020-06-14 ENCOUNTER — Telehealth (INDEPENDENT_AMBULATORY_CARE_PROVIDER_SITE_OTHER): Payer: 59 | Admitting: Nurse Practitioner

## 2020-06-14 ENCOUNTER — Encounter: Payer: Self-pay | Admitting: Nurse Practitioner

## 2020-06-14 VITALS — BP 133/86 | HR 70 | Temp 97.3°F

## 2020-06-14 DIAGNOSIS — J209 Acute bronchitis, unspecified: Secondary | ICD-10-CM

## 2020-06-14 MED ORDER — PREDNISONE 10 MG (21) PO TBPK: ORAL_TABLET | ORAL | 0 refills | Status: AC

## 2020-06-14 NOTE — Progress Notes (Signed)
BP 133/86   Pulse 70   Temp (!) 97.3 F (36.3 C) (Oral)   LMP  (LMP Unknown)   SpO2 97%    Subjective:    Patient ID: Debra Cannon, female    DOB: 1955/12/21, 65 y.o.   MRN: [id number]  HPI: Debra Cannon is a 64 y.o. female presenting with upper respiratory tract infection symptoms.  Chief Complaint  Patient presents with  . URI    pt states she started feeling really bad this past Friday. States covid test was negative. States she is still having some drainage and coughing up phlegm   UPPER RESPIRATORY TRACT  INFECTION Reports she had a COVID test performed and results were negative; today was her first day back at work at the [place] center.  Onset: 1 week Worst symptom: drainage and cough Fever: no Cough: yes; non-productive; coarse cough Shortness of breath: yes Wheezing: no Chest pain: no Chest tightness: no Chest congestion: yes Nasal congestion: no Runny nose: no Post nasal drip: yes Sneezing: no Sore throat: no Swollen glands: no Sinus pressure: no Headache: yes on right side Face pain: no Toothache: no Ear pain: yes "right Ear pressure: no  Eyes red/itching:no Eye drainage/crusting: no  Vomiting: no  Nausea: no Appetite change: no Rash: no Fatigue: no Sick contacts: no Strep contacts: no  Context: better; fluctuating Recurrent sinusitis: no Relief with OTC cold/cough medications: some   Treatments attempted: tylenol, mucinex   No Known Allergies  Outpatient Encounter Medications as of 06/14/2020  Medication Sig  . acyclovir (ZOVIRAX) 400 MG tablet TAKE 1 TABLET (400 MG TOTAL) BY MOUTH 3 (THREE) TIMES DAILY.  Marland Kitchen aspirin EC 81 MG tablet Take 1 tablet (81 mg total) by mouth daily.  . B Complex Vitamins (VITAMIN B COMPLEX) TABS Take 1 tablet by mouth daily.  . Cholecalciferol (VITAMIN D PO) Take 2,000 Units by mouth daily.   . citalopram (CELEXA) 40 MG tablet Take 1 tablet (40 mg total) by mouth daily.  . cyclobenzaprine (FLEXERIL) 10 MG tablet  Take 1 tablet (10 mg total) by mouth at bedtime.  Eduard Roux (AIMOVIG) 70 MG/ML SOAJ Inject 70 mg into the skin every 30 (thirty) days.  Marland Kitchen Fexofenadine HCl (ALLEGRA PO) Take by mouth daily.  Marland Kitchen omeprazole (PRILOSEC) 20 MG capsule Take 20 mg by mouth daily as needed.   . SUMAtriptan (IMITREX) 100 MG tablet Take 1 tablet (100 mg total) by mouth as needed. May repeat in 2 hours if headache persists or recurs.  Marland Kitchen zolmitriptan (ZOMIG) 5 MG tablet Take 1 tablet (5 mg total) by mouth daily as needed.  . predniSONE (STERAPRED UNI-PAK 21 TAB) 10 MG (21) TBPK tablet Take 6 tablets (60 mg total) by mouth daily for 1 day, THEN 5 tablets (50 mg total) daily for 1 day, THEN 4 tablets (40 mg total) daily for 1 day, THEN 3 tablets (30 mg total) daily for 1 day, THEN 2 tablets (20 mg total) daily for 1 day, THEN 1 tablet (10 mg total) daily for 1 day.   Facility-Administered Encounter Medications as of 06/14/2020  Medication  . aspirin EC tablet 324 mg   Patient Active Problem List   Diagnosis Date Noted  . Acute bronchitis 06/15/2020  . Chest pressure 01/12/2020  . Lipoma of chest wall 01/06/2018  . Major depression in full remission ([place]) 01/03/2018  . Insomnia 11/02/2016  . Herpes simplex 08/30/2015  . Hypercholesteremia 08/30/2015  . Menopause 08/30/2015  . Migraine 08/30/2015  Past Medical History:  Diagnosis Date  . Herpes   . Hypercholesteremia   . Menopause   . Migraine    Relevant past medical, surgical, family and social history reviewed and updated as indicated. Interim medical history since our last visit reviewed.  Review of Systems  Constitutional: Negative for fever.  HENT: Positive for congestion, ear pain and postnasal drip. Negative for dental problem, ear discharge, rhinorrhea, sinus pressure, sinus pain, sneezing and sore throat.   Eyes: Negative.  Negative for pain, discharge, redness and itching.  Respiratory: Positive for cough (non-productive) and shortness of breath.  Negative for chest tightness and wheezing.   Cardiovascular: Negative.  Negative for chest pain.  Gastrointestinal: Negative.  Negative for nausea and vomiting.  Musculoskeletal: Negative.   Skin: Negative for rash.  Neurological: Negative.  Negative for headaches.  Psychiatric/Behavioral: Negative.     Per HPI unless specifically indicated above     Objective:    BP 133/86   Pulse 70   Temp (!) 97.3 F (36.3 C) (Oral)   LMP  (LMP Unknown)   SpO2 97%   Wt Readings from Last 3 Encounters:  05/30/20 187 lb 3.2 oz (84.9 kg)  01/17/20 187 lb 6.3 oz (85 kg)  01/17/20 189 lb 4 oz (85.8 kg)    Physical Exam Physical examination unable to be performed due to lack of equipment.  Results for orders placed or performed in visit on 05/30/20  CBC with Differential/Platelet  Result Value Ref Range   WBC 7.8 3.4 - 10.8 x10E3/uL   RBC 4.37 3.77 - 5.28 x10E6/uL   Hemoglobin 13.7 11.1 - 15.9 g/dL   Hematocrit 39.4 34.0 - 46.6 %   MCV 90 79 - 97 fL   MCH 31.4 26.6 - 33.0 pg   MCHC 34.8 31 - 35 g/dL   RDW 12.0 11.7 - 15.4 %   Platelets 328 150 - 450 x10E3/uL   Neutrophils 60 Not Estab. %   Lymphs 28 Not Estab. %   Monocytes 8 Not Estab. %   Eos 3 Not Estab. %   Basos 0 Not Estab. %   Neutrophils Absolute 4.6 1 - 7 x10E3/uL   Lymphocytes Absolute 2.2 0 - 3 x10E3/uL   Monocytes Absolute 0.7 0 - 0 x10E3/uL   EOS (ABSOLUTE) 0.2 0.0 - 0.4 x10E3/uL   Basophils Absolute 0.0 0 - 0 x10E3/uL   Immature Granulocytes 1 Not Estab. %   Immature Grans (Abs) 0.0 0.0 - 0.1 x10E3/uL  Comprehensive metabolic panel  Result Value Ref Range   Glucose 87 65 - 99 mg/dL   BUN 15 8 - 27 mg/dL   Creatinine, Ser 0.87 0.57 - 1.00 mg/dL   GFR calc non Af Amer 72 >59 mL/min/1.73   GFR calc Af Amer 83 >59 mL/min/1.73   BUN/Creatinine Ratio 17 12 - 28   Sodium 135 134 - 144 mmol/L   Potassium 4.3 3.5 - 5.2 mmol/L   Chloride 101 96 - 106 mmol/L   CO2 25 20 - 29 mmol/L   Calcium 9.3 8.7 - 10.3 mg/dL    Total Protein 6.3 6.0 - 8.5 g/dL   Albumin 4.6 3.8 - 4.8 g/dL   Globulin, Total 1.7 1.5 - 4.5 g/dL   Albumin/Globulin Ratio 2.7 (H) 1.2 - 2.2   Bilirubin Total 0.5 0.0 - 1.2 mg/dL   Alkaline Phosphatase 97 48 - 121 IU/L   AST 15 0 - 40 IU/L   ALT 13 0 - 32 IU/L  Lipid Panel  w/o Chol/HDL Ratio  Result Value Ref Range   Cholesterol, Total 216 (H) 100 - 199 mg/dL   Triglycerides 283 (H) 0 - 149 mg/dL   HDL 35 (L) >39 mg/dL   VLDL Cholesterol Cal 51 (H) 5 - 40 mg/dL   LDL Chol Calc (NIH) 130 (H) 0 - 99 mg/dL  TSH  Result Value Ref Range   TSH 1.060 0.450 - 4.500 uIU/mL  Urinalysis, Routine w reflex microscopic  Result Value Ref Range   Specific Gravity, UA <1.005 (L) 1.005 - 1.030   pH, UA 5.0 5.0 - 7.5   Color, UA Yellow Yellow   Appearance Ur Clear Clear   Leukocytes,UA Negative Negative   Protein,UA Negative Negative/Trace   Glucose, UA Negative Negative   Ketones, UA Negative Negative   RBC, UA Negative Negative   Bilirubin, UA Negative Negative   Urobilinogen, Ur 0.2 0.2 - 1.0 mg/dL   Nitrite, UA Negative Negative      Assessment & Plan:   Problem List Items Addressed This Visit      Respiratory   Acute bronchitis - Primary    Acute, ongoing x 1 week. Antibiotics not indicated at this point, to continue supportive care with mucinex, plenty of hydration, and over-the-counter management for symptoms.  Start prednisone taper x 6 days.  Return to clinic next week if not better.        Relevant Medications   predniSONE (STERAPRED UNI-PAK 21 TAB) 10 MG (21) TBPK tablet       Follow up plan: Return if symptoms worsen or fail to improve.  This visit was completed via telephone due to the restrictions of the COVID-19 pandemic. All issues as above were discussed and addressed but no physical exam was performed. If it was felt that the patient should be evaluated in the office, they were directed there. The patient verbally consented to this visit. Patient was unable to  complete an audio/visual visit due to Technical difficulties,Lack of internet. . Location of the patient: work . Location of the provider: work . Those involved with this call:  . Provider: Carnella Guadalajara, DNP . CMA: Yvonna Alanis, CMA . Front Desk/Registration: Don Perking  . Time spent on call: 10 minutes on the phone discussing health concerns. 20 minutes total spent in review of patient's record and preparation of their chart.  I verified patient identity using two factors (patient name and date of birth). Patient consents verbally to being seen via telemedicine visit today.

## 2020-06-14 NOTE — Patient Instructions (Signed)

## 2020-06-15 DIAGNOSIS — J209 Acute bronchitis, unspecified: Secondary | ICD-10-CM | POA: Insufficient documentation

## 2020-06-15 NOTE — Assessment & Plan Note (Signed)
Acute, ongoing x 1 week. Antibiotics not indicated at this point, to continue supportive care with mucinex, plenty of hydration, and over-the-counter management for symptoms.  Start prednisone taper x 6 days.  Return to clinic next week if not better.

## 2020-07-30 DIAGNOSIS — D2272 Melanocytic nevi of left lower limb, including hip: Secondary | ICD-10-CM | POA: Diagnosis not present

## 2020-07-30 DIAGNOSIS — D225 Melanocytic nevi of trunk: Secondary | ICD-10-CM | POA: Diagnosis not present

## 2020-07-30 DIAGNOSIS — D2261 Melanocytic nevi of right upper limb, including shoulder: Secondary | ICD-10-CM | POA: Diagnosis not present

## 2020-07-30 DIAGNOSIS — D2262 Melanocytic nevi of left upper limb, including shoulder: Secondary | ICD-10-CM | POA: Diagnosis not present

## 2020-07-30 DIAGNOSIS — D2271 Melanocytic nevi of right lower limb, including hip: Secondary | ICD-10-CM | POA: Diagnosis not present

## 2020-07-30 DIAGNOSIS — L821 Other seborrheic keratosis: Secondary | ICD-10-CM | POA: Diagnosis not present

## 2020-08-19 ENCOUNTER — Encounter: Payer: Self-pay | Admitting: Family Medicine

## 2020-08-23 ENCOUNTER — Other Ambulatory Visit: Payer: Self-pay | Admitting: Family Medicine

## 2020-08-23 MED ORDER — CITALOPRAM HYDROBROMIDE 40 MG PO TABS: 60.0000 mg | ORAL_TABLET | Freq: Every day | ORAL | 1 refills | Status: DC

## 2020-09-24 ENCOUNTER — Other Ambulatory Visit: Payer: Self-pay

## 2020-09-24 ENCOUNTER — Ambulatory Visit
Admission: RE | Admit: 2020-09-24 | Discharge: 2020-09-24 | Disposition: A | Discharge status: Home / Self Care | Payer: 59 | Source: Ambulatory Visit | Attending: Family Medicine | Admitting: Family Medicine

## 2020-09-24 DIAGNOSIS — Z1231 Encounter for screening mammogram for malignant neoplasm of breast: Secondary | ICD-10-CM | POA: Diagnosis not present

## 2020-12-02 ENCOUNTER — Other Ambulatory Visit: Payer: Self-pay | Admitting: Family Medicine

## 2020-12-02 NOTE — Telephone Encounter (Signed)
Requested Prescriptions  Pending Prescriptions Disp Refills  . zolmitriptan (ZOMIG) 5 MG tablet [Pharmacy Med Name: ZOLMitriptan 5 MG TABS 5 Tablet] 6 tablet 12    Sig: TAKE 1 TABLET BY MOUTH DAILY AS NEEDED.     Neurology:  Migraine Therapy - Triptan Passed - 12/02/2020  9:15 AM      Passed - Last BP in normal range    BP Readings from Last 1 Encounters:  06/14/20 133/86         Passed - Valid encounter within last 12 months    Recent Outpatient Visits          5 months ago Acute bronchitis, unspecified organism   [facility] Eulogio Bear, NP   6 months ago Routine general medical examination at a health care facility   [facility], [place] P, DO   10 months ago Chest pressure   [place], [place] T, NP   1 year ago Major depressive disorder with single episode, in full remission [facility])   [place], Megan P, DO   1 year ago Upper respiratory tract infection, unspecified type   [facility], [place], [place]             . SUMAtriptan (IMITREX) 100 MG tablet [Pharmacy Med Name: SUMAtriptan SUCCINATE 100 M 100 Tablet] 9 tablet 12    Sig: TAKE 1 TABLET BY MOUTH AS NEEDED. MAY REPEAT IN 2 HOURS IF HEADACHE PERSISTS OR RECURS.     Neurology:  Migraine Therapy - Triptan Passed - 12/02/2020  9:15 AM      Passed - Last BP in normal range    BP Readings from Last 1 Encounters:  06/14/20 133/86         Passed - Valid encounter within last 12 months    Recent Outpatient Visits          5 months ago Acute bronchitis, unspecified organism   [facility] Eulogio Bear, NP   6 months ago Routine general medical examination at a health care facility   [facility], [place] P, DO   10 months ago Chest pressure   [place], [place] T, NP   1 year ago Major depressive disorder with single episode, in full remission [facility])    [place], Megan P, DO   1 year ago Upper respiratory tract infection, unspecified type   [facility], [place], [place]             . AIMOVIG 70 MG/ML SOAJ [Pharmacy Med Name: AIMOVIG 70 MG/ML SOAJ 70 Solution Auto-injector] 1 mL     Sig: INJECT 70 MG INTO THE SKIN EVERY 30 DAYS.     Off-Protocol Failed - 12/02/2020  9:15 AM      Failed - Medication not assigned to a protocol, review manually.      Passed - Valid encounter within last 12 months    Recent Outpatient Visits          5 months ago Acute bronchitis, unspecified organism   [facility] Eulogio Bear, NP   6 months ago Routine general medical examination at a health care facility   [facility], [place], DO   10 months ago Chest pressure   [place], Henrine [place] T, NP   1 year ago Major depressive disorder with single episode, in full remission ([place])   [facility] [place], [place], DO   1 year ago Upper respiratory tract infection, unspecified type   [place], [place], [place]            Off-Protocol Failed - 12/02/2020  9:15 AM      Failed - Medication not assigned to a protocol, review manually.      Passed - Valid encounter within last 12 months    Recent Outpatient Visits          5 months ago Acute bronchitis, unspecified organism   [facility] Eulogio Bear, NP   6 months ago Routine general medical examination at a health care facility   [facility], [place], DO   10 months ago Chest pressure   [place], [place] T, NP   1 year ago Major depressive disorder with single episode, in full remission [facility])   [place], Megan P, DO   1 year ago Upper respiratory tract infection, unspecified type   [place], [place], [place]

## 2020-12-02 NOTE — Telephone Encounter (Signed)
Requested medication (s) are due for refill today: yes  Requested medication (s) are on the active medication list: yes  Last refill:  10/24/19  Future visit scheduled: no  Notes to clinic:  med not assigned to a protocol   Requested Prescriptions  Pending Prescriptions Disp Refills   AIMOVIG 66 MG/ML SOAJ [Pharmacy Med Name: AIMOVIG 70 MG/ML SOAJ 70 Solution Auto-injector] 1 mL     Sig: INJECT 70 MG INTO THE SKIN EVERY 30 DAYS.      Off-Protocol Failed - 12/02/2020  9:15 AM      Failed - Medication not assigned to a protocol, review manually.      Passed - Valid encounter within last 12 months    Recent Outpatient Visits           5 months ago Acute bronchitis, unspecified organism   [facility] Eulogio Bear, NP   6 months ago Routine general medical examination at a health care facility   [facility], [place] P, DO   10 months ago Chest pressure   [place], [place] T, NP   1 year ago Major depressive disorder with single episode, in full remission [facility])   [place], Megan P, DO   1 year ago Upper respiratory tract infection, unspecified type   [place], [employer], [place]               Off-Protocol Failed - 12/02/2020  9:15 AM      Failed - Medication not assigned to a protocol, review manually.      Passed - Valid encounter within last 12 months    Recent Outpatient Visits           5 months ago Acute bronchitis, unspecified organism   [facility] Eulogio Bear, NP   6 months ago Routine general medical examination at a health care facility   [facility], [place] P, DO   10 months ago Chest pressure   [place], [place] T, NP   1 year ago Major depressive disorder with single episode, in full remission [facility])   [place], Megan P, DO   1 year ago Upper respiratory tract  infection, unspecified type   [facility] Mer[place] Roof [place], [place]                 Signed Prescriptions Disp Refills   zolmitriptan (ZOMIG) 5 MG tablet 6 tablet 12    Sig: TAKE 1 TABLET BY MOUTH DAILY AS NEEDED.      Neurology:  Migraine Therapy - Triptan Passed - 12/02/2020  9:15 AM      Passed - Last BP in normal range    BP Readings from Last 1 Encounters:  06/14/20 133/86          Passed - Valid encounter within last 12 months    Recent Outpatient Visits           5 months ago Acute bronchitis, unspecified organism   [facility] Eulogio Bear, NP   6 months ago Routine general medical examination at a health care facility   [facility], [place], DO   10 months ago Chest pressure   [place], Frannie T, NP   1 year ago Major depressive disorder with single episode, in full remission [facility])   [place], Megan P, DO   1  year ago Upper respiratory tract infection, unspecified type   [facility] Merrie Roof [place], [place]                  SUMAtriptan (IMITREX) 100 MG tablet 9 tablet 12    Sig: TAKE 1 TABLET BY MOUTH AS NEEDED. MAY REPEAT IN 2 HOURS IF HEADACHE PERSISTS OR RECURS.      Neurology:  Migraine Therapy - Triptan Passed - 12/02/2020  9:15 AM      Passed - Last BP in normal range    BP Readings from Last 1 Encounters:  06/14/20 133/86          Passed - Valid encounter within last 12 months    Recent Outpatient Visits           5 months ago Acute bronchitis, unspecified organism   [facility] Eulogio Bear, NP   6 months ago Routine general medical examination at a health care facility   [facility], [place] P, DO   10 months ago Chest pressure   [place], [place] T, NP   1 year ago Major depressive disorder with single episode, in full remission [facility])   [place], Megan P, DO   1 year ago Upper respiratory tract infection, unspecified type   [facility], [place]

## 2020-12-03 ENCOUNTER — Telehealth: Payer: Self-pay

## 2020-12-03 NOTE — Telephone Encounter (Signed)
PA for Aimovig submitted and approved via cover my meds.

## 2020-12-04 ENCOUNTER — Telehealth: Payer: Self-pay

## 2020-12-04 NOTE — Telephone Encounter (Signed)
PA for Aimovig 70 mg/ml autoinjector has been approved from 12/03/20 through 12/02/21 as long as patient in enrolled in current health plan.  Approved quanity limit is 1.0 with a day supply limit to 30.0

## 2020-12-04 NOTE — Telephone Encounter (Signed)
Patient was called and informed that her PA for Amovig has been approved and should be at her pharmacy for her to pick up

## 2021-01-29 ENCOUNTER — Other Ambulatory Visit: Payer: Self-pay

## 2021-01-29 MED FILL — Erenumab-aooe Subcutaneous Soln Auto-Injector 70 MG/ML: SUBCUTANEOUS | 30 days supply | Qty: 1 | Fill #0 | Status: AC

## 2021-01-29 MED FILL — Sumatriptan Succinate Tab 100 MG: ORAL | 30 days supply | Qty: 9 | Fill #0 | Status: AC

## 2021-01-29 MED FILL — Zolmitriptan Tab 5 MG: ORAL | 30 days supply | Qty: 6 | Fill #0 | Status: AC

## 2021-02-27 ENCOUNTER — Other Ambulatory Visit: Payer: Self-pay

## 2021-02-27 MED FILL — Zolmitriptan Tab 5 MG: ORAL | 30 days supply | Qty: 6 | Fill #1 | Status: AC

## 2021-02-27 MED FILL — Sumatriptan Succinate Tab 100 MG: ORAL | 30 days supply | Qty: 9 | Fill #1 | Status: AC

## 2021-02-27 MED FILL — Erenumab-aooe Subcutaneous Soln Auto-Injector 70 MG/ML: SUBCUTANEOUS | 30 days supply | Qty: 1 | Fill #1 | Status: AC

## 2021-03-04 ENCOUNTER — Other Ambulatory Visit: Payer: Self-pay

## 2021-03-04 MED ORDER — CARESTART COVID-19 HOME TEST VI KIT
PACK | 0 refills | Status: DC
  Filled 2021-03-04: qty 2, 4d supply, fill #0

## 2021-03-24 ENCOUNTER — Other Ambulatory Visit: Payer: Self-pay

## 2021-03-24 MED FILL — Erenumab-aooe Subcutaneous Soln Auto-Injector 70 MG/ML: SUBCUTANEOUS | 30 days supply | Qty: 1 | Fill #2 | Status: AC

## 2021-04-01 ENCOUNTER — Other Ambulatory Visit: Payer: Self-pay

## 2021-04-01 MED FILL — Zolmitriptan Tab 5 MG: ORAL | 30 days supply | Qty: 6 | Fill #2 | Status: AC

## 2021-04-01 MED FILL — Sumatriptan Succinate Tab 100 MG: ORAL | 30 days supply | Qty: 9 | Fill #2 | Status: AC

## 2021-04-22 ENCOUNTER — Other Ambulatory Visit: Payer: Self-pay

## 2021-05-20 ENCOUNTER — Other Ambulatory Visit: Payer: Self-pay

## 2021-05-20 MED FILL — Zolmitriptan Tab 5 MG: ORAL | 30 days supply | Qty: 6 | Fill #3 | Status: AC

## 2021-05-20 MED FILL — Erenumab-aooe Subcutaneous Soln Auto-Injector 70 MG/ML: SUBCUTANEOUS | 30 days supply | Qty: 1 | Fill #3 | Status: AC

## 2021-05-20 MED FILL — Sumatriptan Succinate Tab 100 MG: ORAL | 30 days supply | Qty: 9 | Fill #3 | Status: AC

## 2021-06-03 ENCOUNTER — Encounter: Payer: Self-pay | Admitting: Family Medicine

## 2021-06-03 ENCOUNTER — Other Ambulatory Visit: Payer: Self-pay

## 2021-06-03 ENCOUNTER — Ambulatory Visit (INDEPENDENT_AMBULATORY_CARE_PROVIDER_SITE_OTHER): Payer: 59 | Admitting: Family Medicine

## 2021-06-03 VITALS — BP 109/69 | HR 66 | Temp 99.4°F | Wt 186.4 lb

## 2021-06-03 DIAGNOSIS — E7800 Pure hypercholesterolemia, unspecified: Secondary | ICD-10-CM

## 2021-06-03 DIAGNOSIS — G43909 Migraine, unspecified, not intractable, without status migrainosus: Secondary | ICD-10-CM | POA: Diagnosis not present

## 2021-06-03 DIAGNOSIS — Z23 Encounter for immunization: Secondary | ICD-10-CM | POA: Diagnosis not present

## 2021-06-03 DIAGNOSIS — Z1231 Encounter for screening mammogram for malignant neoplasm of breast: Secondary | ICD-10-CM | POA: Diagnosis not present

## 2021-06-03 DIAGNOSIS — Z0000 Encounter for general adult medical examination without abnormal findings: Secondary | ICD-10-CM

## 2021-06-03 DIAGNOSIS — F325 Major depressive disorder, single episode, in full remission: Secondary | ICD-10-CM | POA: Diagnosis not present

## 2021-06-03 LAB — URINALYSIS, ROUTINE W REFLEX MICROSCOPIC
Bilirubin, UA: NEGATIVE
Glucose, UA: NEGATIVE
Ketones, UA: NEGATIVE
Leukocytes,UA: NEGATIVE
Nitrite, UA: NEGATIVE
Protein,UA: NEGATIVE
RBC, UA: NEGATIVE
Specific Gravity, UA: <1.005 — ABNORMAL LOW (ref 1.005–1.030)
Urobilinogen, Ur: 0.2 mg/dL (ref 0.2–1.0)
pH, UA: 5 (ref 5.0–7.5)

## 2021-06-03 MED ORDER — CITALOPRAM HYDROBROMIDE 40 MG PO TABS
40.0000 mg | ORAL_TABLET | Freq: Every day | ORAL | 1 refills | Status: DC
  Filled 2021-06-03: qty 90, 90d supply, fill #0
  Filled 2021-11-14: qty 90, 90d supply, fill #1

## 2021-06-03 NOTE — Progress Notes (Signed)
BP 109/69   Pulse 66   Temp 99.4 F (37.4 C) (Oral)   Wt 186 lb 6.4 oz (84.6 kg)   LMP  (LMP Unknown)   SpO2 98%   BMI 28.34 kg/m    Subjective:    Patient ID: Debra Cannon, female    DOB: 1956-01-30, 65 y.o.   MRN: [id number]  HPI: Debra Cannon is a 65 y.o. female presenting on 06/03/2021 for comprehensive medical examination. Current medical complaints include:  Migraines have been stable. No concerns.Tolerating medication well.  DEPRESSION Mood status: stable Satisfied with current treatment?: yes Symptom severity: mild  Duration of current treatment : chronic Side effects: no Medication compliance: excellent compliance Psychotherapy/counseling: no  Previous psychiatric medicationnos: celexa Depressed mood: no Anxious mood: no Anhedonia: no Significant weight loss or gain: no Insomnia: no  Fatigue: no Feelings of worthlessness or guilt: no Impaired concentration/indecisiveness: no Suicidal ideations: no Hopelessness: no Crying spells: no Depression screen [facility] 2/9 06/03/2021 05/30/2020 11/30/2019 05/25/2019 10/28/2018  Decreased Interest 0 0 1 1 0  Down, Depressed, Hopeless 0 0 0 1 1  PHQ - 2 Score 0 0 '1 2 1  ' Altered sleeping 0 0 '1 1 1  ' Tired, decreased energy 0 '1 1 1 1  ' Change in appetite 0 0 0 0 0  Feeling bad or failure about yourself  0 0 0 1 1  Trouble concentrating 0 '1 1 2 ' 0  Moving slowly or fidgety/restless 0 0 0 0 0  Suicidal thoughts 0 0 0 0 0  PHQ-9 Score 0 '2 4 7 4  ' Difficult doing work/chores Not difficult at all - - Very difficult Not difficult at all  Some recent data might be hidden   Menopausal Symptoms: no  Depression Screen done today and results listed below:  Depression screen [facility] 2/9 06/03/2021 05/30/2020 11/30/2019 05/25/2019 10/28/2018  Decreased Interest 0 0 1 1 0  Down, Depressed, Hopeless 0 0 0 1 1  PHQ - 2 Score 0 0 '1 2 1  ' Altered sleeping 0 0 '1 1 1  ' Tired, decreased energy 0 '1 1 1 1  ' Change in appetite 0 0 0 0 0  Feeling bad or  failure about yourself  0 0 0 1 1  Trouble concentrating 0 '1 1 2 ' 0  Moving slowly or fidgety/restless 0 0 0 0 0  Suicidal thoughts 0 0 0 0 0  PHQ-9 Score 0 '2 4 7 4  ' Difficult doing work/chores Not difficult at all - - Very difficult Not difficult at all  Some recent data might be hidden    Past Medical History:  Past Medical History:  Diagnosis Date   Herpes    Hypercholesteremia    Menopause    Migraine     Surgical History:  Past Surgical History:  Procedure Laterality Date   APPENDECTOMY     BREAST EXCISIONAL BIOPSY Right 2019   lipoma   COLONOSCOPY  09/03/2008   Dr Vira Agar   COLONOSCOPY WITH PROPOFOL N/A 06/23/2019   Procedure: COLONOSCOPY WITH PROPOFOL;  Surgeon: Jonathon Bellows, MD;  [facility];  Service: Gastroenterology;  Laterality: N/A;   laporoscopy     REPAIR ANKLE LIGAMENT Left 2004 & 2005   UPPER GI ENDOSCOPY  2009   Dr Vira Agar    Medications:  Current Outpatient Medications on File Prior to Visit  Medication Sig   B Complex Vitamins (VITAMIN B COMPLEX) TABS Take 1 tablet by mouth daily.   Cholecalciferol (VITAMIN D PO) Take  2,000 Units by mouth daily.    COVID-19 At Home Antigen Test [facility] COVID-19 HOME TEST) KIT use as directed within package instructions   cyclobenzaprine (FLEXERIL) 10 MG tablet Take 1 tablet (10 mg total) by mouth at bedtime.   Erenumab-aooe 70 MG/ML SOAJ INJECT 70 MG INTO THE SKIN EVERY 30 DAYS.   Fexofenadine HCl (ALLEGRA PO) Take by mouth daily.   omeprazole (PRILOSEC) 20 MG capsule Take 20 mg by mouth daily as needed.    SUMAtriptan (IMITREX) 100 MG tablet TAKE 1 TABLET BY MOUTH AS NEEDED. MAY REPEAT IN 2 HOURS IF HEADACHE PERSISTS OR RECURS.   zolmitriptan (ZOMIG) 5 MG tablet TAKE 1 TABLET BY MOUTH DAILY AS NEEDED.   Current Facility-Administered Medications on File Prior to Visit  Medication   aspirin EC tablet 324 mg    Allergies:  No Known Allergies  Social History:  Social History   Socioeconomic  History   Marital status: Married    Spouse name: Not on file   Number of children: Not on file   Years of education: Not on file   Highest education level: Not on file  Occupational History   Not on file  Tobacco Use   Smoking status: Former    Packs/day: 1.00    Years: 20.00    Pack years: 20.00    Types: Cigarettes    Quit date: 2000    Years since quitting: 22.6   Smokeless tobacco: Never  Vaping Use   Vaping Use: Never used  Substance and Sexual Activity   Alcohol use: Not Currently    Alcohol/week: 0.0 standard drinks    Comment: 2 drinks per year   Drug use: No   Sexual activity: Yes  Other Topics Concern   Not on file  Social History Narrative   Not on file   Social Determinants of Health   Financial Resource Strain: Not on file  Food Insecurity: Not on file  Transportation Needs: Not on file  Physical Activity: Not on file  Stress: Not on file  Social Connections: Not on file  Intimate Partner Violence: Not on file   Social History   Tobacco Use  Smoking Status Former   Packs/day: 1.00   Years: 20.00   Pack years: 20.00   Types: Cigarettes   Quit date: 2000   Years since quitting: 22.6  Smokeless Tobacco Never   Social History   Substance and Sexual Activity  Alcohol Use Not Currently   Alcohol/week: 0.0 standard drinks   Comment: 2 drinks per year    Family History:  Family History  Problem Relation Age of Onset   Osteoporosis Mother    Thyroid disease Mother    Heart disease Father    Heart attack Father 38   Cancer Sister        endometrial   Cardiomyopathy Brother    Cancer Maternal Grandmother        vulvar   Diabetes Maternal Grandmother    AAA (abdominal aortic aneurysm) Maternal Grandfather    Stroke Paternal Grandmother    Heart disease Paternal Grandfather    Breast cancer Neg Hx     Past medical history, surgical history, medications, allergies, family history and social history reviewed with patient today and changes  made to appropriate areas of the chart.   Review of Systems  Constitutional: Negative.   HENT: Negative.    Eyes: Negative.   Respiratory: Negative.    Cardiovascular:  Positive for leg swelling. Negative for chest  pain, palpitations, orthopnea, claudication and PND.  Gastrointestinal: Negative.   Genitourinary: Negative.   Musculoskeletal: Negative.   Skin: Negative.   Neurological: Negative.   Endo/Heme/Allergies:  Positive for environmental allergies. Negative for polydipsia. Does not bruise/bleed easily.  Psychiatric/Behavioral: Negative.    All other ROS negative except what is listed above and in the HPI.      Objective:    BP 109/69   Pulse 66   Temp 99.4 F (37.4 C) (Oral)   Wt 186 lb 6.4 oz (84.6 kg)   LMP  (LMP Unknown)   SpO2 98%   BMI 28.34 kg/m   Wt Readings from Last 3 Encounters:  06/03/21 186 lb 6.4 oz (84.6 kg)  05/30/20 187 lb 3.2 oz (84.9 kg)  01/17/20 187 lb 6.3 oz (85 kg)    Physical Exam Vitals and nursing note reviewed.  Constitutional:      General: She is not in acute distress.    Appearance: Normal appearance. She is not ill-appearing, toxic-appearing or diaphoretic.  HENT:     Head: Normocephalic and atraumatic.     Right Ear: Tympanic membrane, ear canal and external ear normal. There is no impacted cerumen.     Left Ear: Tympanic membrane, ear canal and external ear normal. There is no impacted cerumen.     Nose: Nose normal. No congestion or rhinorrhea.     Mouth/Throat:     Mouth: Mucous membranes are moist.     Pharynx: Oropharynx is clear. No oropharyngeal exudate or posterior oropharyngeal erythema.  Eyes:     General: No scleral icterus.       Right eye: No discharge.        Left eye: No discharge.     Extraocular Movements: Extraocular movements intact.     Conjunctiva/sclera: Conjunctivae normal.     Pupils: Pupils are equal, round, and reactive to light.  Neck:     Vascular: No carotid bruit.  Cardiovascular:     Rate and  Rhythm: Normal rate and regular rhythm.     Pulses: Normal pulses.     Heart sounds: No murmur heard.   No friction rub. No gallop.  Pulmonary:     Effort: Pulmonary effort is normal. No respiratory distress.     Breath sounds: Normal breath sounds. No stridor. No wheezing, rhonchi or rales.  Chest:     Chest wall: No tenderness.  Abdominal:     General: Abdomen is flat. Bowel sounds are normal. There is no distension.     Palpations: Abdomen is soft. There is no mass.     Tenderness: There is no abdominal tenderness. There is no right CVA tenderness, left CVA tenderness, guarding or rebound.     Hernia: No hernia is present.  Genitourinary:    Comments: Breast and pelvic exams deferred with shared decision making Musculoskeletal:        General: No swelling, tenderness, deformity or signs of injury.     Cervical back: Normal range of motion and neck supple. No rigidity. No muscular tenderness.     Right lower leg: No edema.     Left lower leg: No edema.  Lymphadenopathy:     Cervical: No cervical adenopathy.  Skin:    General: Skin is warm and dry.     Capillary Refill: Capillary refill takes less than 2 seconds.     Coloration: Skin is not jaundiced or pale.     Findings: No bruising, erythema, lesion or rash.  Neurological:  General: No focal deficit present.     Mental Status: She is alert and oriented to person, place, and time. Mental status is at baseline.     Cranial Nerves: No cranial nerve deficit.     Sensory: No sensory deficit.     Motor: No weakness.     Coordination: Coordination normal.     Gait: Gait normal.     Deep Tendon Reflexes: Reflexes normal.  Psychiatric:        Mood and Affect: Mood normal.        Behavior: Behavior normal.        Thought Content: Thought content normal.        Judgment: Judgment normal.    Results for orders placed or performed in visit on 06/03/21  CBC with Differential/Platelet  Result Value Ref Range   WBC 8.1 3.4 -  10.8 x10E3/uL   RBC 4.49 3.77 - 5.28 x10E6/uL   Hemoglobin 13.8 11.1 - 15.9 g/dL   Hematocrit 40.4 34.0 - 46.6 %   MCV 90 79 - 97 fL   MCH 30.7 26.6 - 33.0 pg   MCHC 34.2 31.5 - 35.7 g/dL   RDW 11.9 11.7 - 15.4 %   Platelets 354 150 - 450 x10E3/uL   Neutrophils 61 Not Estab. %   Lymphs 28 Not Estab. %   Monocytes 7 Not Estab. %   Eos 3 Not Estab. %   Basos 1 Not Estab. %   Neutrophils Absolute 5.0 1.4 - 7.0 x10E3/uL   Lymphocytes Absolute 2.3 0.7 - 3.1 x10E3/uL   Monocytes Absolute 0.6 0.1 - 0.9 x10E3/uL   EOS (ABSOLUTE) 0.2 0.0 - 0.4 x10E3/uL   Basophils Absolute 0.0 0.0 - 0.2 x10E3/uL   Immature Granulocytes 0 Not Estab. %   Immature Grans (Abs) 0.0 0.0 - 0.1 x10E3/uL  Comprehensive metabolic panel  Result Value Ref Range   Glucose 81 65 - 99 mg/dL   BUN 15 8 - 27 mg/dL   Creatinine, Ser 0.96 0.57 - 1.00 mg/dL   eGFR 66 >59 mL/min/1.73   BUN/Creatinine Ratio 16 12 - 28   Sodium 139 134 - 144 mmol/L   Potassium 4.3 3.5 - 5.2 mmol/L   Chloride 99 96 - 106 mmol/L   CO2 25 20 - 29 mmol/L   Calcium 9.5 8.7 - 10.3 mg/dL   Total Protein 6.4 6.0 - 8.5 g/dL   Albumin 4.5 3.8 - 4.8 g/dL   Globulin, Total 1.9 1.5 - 4.5 g/dL   Albumin/Globulin Ratio 2.4 (H) 1.2 - 2.2   Bilirubin Total 0.5 0.0 - 1.2 mg/dL   Alkaline Phosphatase 105 44 - 121 IU/L   AST 13 0 - 40 IU/L   ALT 13 0 - 32 IU/L  Lipid Panel w/o Chol/HDL Ratio  Result Value Ref Range   Cholesterol, Total 204 (H) 100 - 199 mg/dL   Triglycerides 311 (H) 0 - 149 mg/dL   HDL 33 (L) >39 mg/dL   VLDL Cholesterol Cal 54 (H) 5 - 40 mg/dL   LDL Chol Calc (NIH) 117 (H) 0 - 99 mg/dL  Urinalysis, Routine w reflex microscopic  Result Value Ref Range   Specific Gravity, UA <1.005 (L) 1.005 - 1.030   pH, UA 5.0 5.0 - 7.5   Color, UA Yellow Yellow   Appearance Ur Clear Clear   Leukocytes,UA Negative Negative   Protein,UA Negative Negative/Trace   Glucose, UA Negative Negative   Ketones, UA Negative Negative   RBC, UA Negative  Negative  Bilirubin, UA Negative Negative   Urobilinogen, Ur 0.2 0.2 - 1.0 mg/dL   Nitrite, UA Negative Negative  TSH  Result Value Ref Range   TSH 1.190 0.450 - 4.500 uIU/mL      Assessment & Plan:   Problem List Items Addressed This Visit       Cardiovascular and Mediastinum   Migraine    Under good control on current regimen. Continue current regimen. Continue to monitor. Call with any concerns. Refills given. Labs drawn today.       Relevant Medications   citalopram (CELEXA) 40 MG tablet     Other   Hypercholesteremia    Rechecking labs today. Await results.       Major depression in full remission ([place])    Under good control on current regimen. Continue current regimen. Continue to monitor. Call with any concerns. Refills given.        Relevant Medications   citalopram (CELEXA) 40 MG tablet   Other Visit Diagnoses     Routine general medical examination at a health care facility    -  Primary   Vaccines updated. Screening labs checked today. Pap up to date. Mammogram ordered. Continue diet and exercise. Call with any concerns.    Relevant Orders   CBC with Differential/Platelet (Completed)   Comprehensive metabolic panel (Completed)   Lipid Panel w/o Chol/HDL Ratio (Completed)   Urinalysis, Routine w reflex microscopic (Completed)   TSH (Completed)   Need for shingles vaccine       Given today.   Relevant Orders   Varicella-zoster vaccine IM (Shingrix) (Completed)   Encounter for screening mammogram for malignant neoplasm of breast       Mammogram ordered today.   Relevant Orders   MM DIAG BREAST TOMO BILATERAL        Follow up plan: Return in about 6 months (around 12/02/2021) for 6 months with me, 3 months for nurse visit for 2nd shingles shot.   LABORATORY TESTING:  - Pap smear: up to date  IMMUNIZATIONS:   - Tdap: Tetanus vaccination status reviewed: last tetanus booster within 10 years. - Influenza: Given elsewhere - Pneumovax: Not  applicable - Prevnar: Not applicable - HPV: Up to date - Zostavax vaccine: Administered today  SCREENING: -Mammogram: Ordered today  - Colonoscopy: Up to date  - Bone Density: Not applicable   PATIENT COUNSELING:   Advised to take 1 mg of folate supplement per day if capable of pregnancy.   Sexuality: Discussed sexually transmitted diseases, partner selection, use of condoms, avoidance of unintended pregnancy  and contraceptive alternatives.   Advised to avoid cigarette smoking.  I discussed with the patient that most people either abstain from alcohol or drink within safe limits (<=14/week and <=4 drinks/occasion for males, <=7/weeks and <= 3 drinks/occasion for females) and that the risk for alcohol disorders and other health effects rises proportionally with the number of drinks per week and how often a drinker exceeds daily limits.  Discussed cessation/primary prevention of drug use and availability of treatment for abuse.   Diet: Encouraged to adjust caloric intake to maintain  or achieve ideal body weight, to reduce intake of dietary saturated fat and total fat, to limit sodium intake by avoiding high sodium foods and not adding table salt, and to maintain adequate dietary potassium and calcium preferably from fresh fruits, vegetables, and low-fat dairy products.    stressed the importance of regular exercise  Injury prevention: Discussed safety belts, safety helmets, smoke detector, smoking near  bedding or upholstery.   Dental health: Discussed importance of regular tooth brushing, flossing, and dental visits.    NEXT PREVENTATIVE PHYSICAL DUE IN 1 YEAR. Return in about 6 months (around 12/02/2021) for 6 months with me, 3 months for nurse visit for 2nd shingles shot.

## 2021-06-04 LAB — TSH: TSH: 1.19 u[IU]/mL (ref 0.450–4.500)

## 2021-06-04 LAB — COMPREHENSIVE METABOLIC PANEL
ALT: 13 IU/L (ref 0–32)
AST: 13 IU/L (ref 0–40)
Albumin/Globulin Ratio: 2.4 — ABNORMAL HIGH (ref 1.2–2.2)
Albumin: 4.5 g/dL (ref 3.8–4.8)
Alkaline Phosphatase: 105 IU/L (ref 44–121)
BUN/Creatinine Ratio: 16 (ref 12–28)
BUN: 15 mg/dL (ref 8–27)
Bilirubin Total: 0.5 mg/dL (ref 0.0–1.2)
CO2: 25 mmol/L (ref 20–29)
Calcium: 9.5 mg/dL (ref 8.7–10.3)
Chloride: 99 mmol/L (ref 96–106)
Creatinine, Ser: 0.96 mg/dL (ref 0.57–1.00)
Globulin, Total: 1.9 g/dL (ref 1.5–4.5)
Glucose: 81 mg/dL (ref 65–99)
Potassium: 4.3 mmol/L (ref 3.5–5.2)
Sodium: 139 mmol/L (ref 134–144)
Total Protein: 6.4 g/dL (ref 6.0–8.5)
eGFR: 66 mL/min/{1.73_m2} (ref >59)

## 2021-06-04 LAB — CBC WITH DIFFERENTIAL/PLATELET
Basophils Absolute: 0 10*3/uL (ref 0.0–0.2)
Basos: 1 %
EOS (ABSOLUTE): 0.2 10*3/uL (ref 0.0–0.4)
Eos: 3 %
Hematocrit: 40.4 % (ref 34.0–46.6)
Hemoglobin: 13.8 g/dL (ref 11.1–15.9)
Immature Grans (Abs): 0 10*3/uL (ref 0.0–0.1)
Immature Granulocytes: 0 %
Lymphocytes Absolute: 2.3 10*3/uL (ref 0.7–3.1)
Lymphs: 28 %
MCH: 30.7 pg (ref 26.6–33.0)
MCHC: 34.2 g/dL (ref 31.5–35.7)
MCV: 90 fL (ref 79–97)
Monocytes Absolute: 0.6 10*3/uL (ref 0.1–0.9)
Monocytes: 7 %
Neutrophils Absolute: 5 10*3/uL (ref 1.4–7.0)
Neutrophils: 61 %
Platelets: 354 10*3/uL (ref 150–450)
RBC: 4.49 x10E6/uL (ref 3.77–5.28)
RDW: 11.9 % (ref 11.7–15.4)
WBC: 8.1 10*3/uL (ref 3.4–10.8)

## 2021-06-04 LAB — LIPID PANEL W/O CHOL/HDL RATIO
Cholesterol, Total: 204 mg/dL — ABNORMAL HIGH (ref 100–199)
HDL: 33 mg/dL — ABNORMAL LOW (ref >39)
LDL Chol Calc (NIH): 117 mg/dL — ABNORMAL HIGH (ref 0–99)
Triglycerides: 311 mg/dL — ABNORMAL HIGH (ref 0–149)
VLDL Cholesterol Cal: 54 mg/dL — ABNORMAL HIGH (ref 5–40)

## 2021-06-04 NOTE — Assessment & Plan Note (Signed)
Under good control on current regimen. Continue current regimen. Continue to monitor. Call with any concerns. Refills given.   

## 2021-06-04 NOTE — Assessment & Plan Note (Signed)
Rechecking labs today. Await results.  

## 2021-06-04 NOTE — Assessment & Plan Note (Signed)
Under good control on current regimen. Continue current regimen. Continue to monitor. Call with any concerns. Refills given. Labs drawn today.   

## 2021-06-05 ENCOUNTER — Other Ambulatory Visit: Payer: Self-pay

## 2021-06-05 ENCOUNTER — Encounter: Payer: Self-pay | Admitting: Family Medicine

## 2021-06-05 MED ORDER — ACYCLOVIR 400 MG PO TABS
ORAL_TABLET | ORAL | 2 refills | Status: DC
  Filled 2021-06-05: qty 90, 30d supply, fill #0

## 2021-06-10 ENCOUNTER — Other Ambulatory Visit: Payer: Self-pay

## 2021-06-10 MED FILL — Sumatriptan Succinate Tab 100 MG: ORAL | 30 days supply | Qty: 9 | Fill #4 | Status: AC

## 2021-06-10 MED FILL — Erenumab-aooe Subcutaneous Soln Auto-Injector 70 MG/ML: SUBCUTANEOUS | 30 days supply | Qty: 1 | Fill #4 | Status: AC

## 2021-06-10 MED FILL — Zolmitriptan Tab 5 MG: ORAL | 30 days supply | Qty: 6 | Fill #4 | Status: AC

## 2021-06-13 ENCOUNTER — Other Ambulatory Visit: Payer: Self-pay

## 2021-07-16 ENCOUNTER — Other Ambulatory Visit: Payer: Self-pay

## 2021-07-16 MED FILL — Sumatriptan Succinate Tab 100 MG: ORAL | 30 days supply | Qty: 9 | Fill #5 | Status: AC

## 2021-07-16 MED FILL — Zolmitriptan Tab 5 MG: ORAL | 30 days supply | Qty: 6 | Fill #5 | Status: AC

## 2021-07-16 MED FILL — Erenumab-aooe Subcutaneous Soln Auto-Injector 70 MG/ML: SUBCUTANEOUS | 30 days supply | Qty: 1 | Fill #5 | Status: AC

## 2021-08-07 DIAGNOSIS — H43812 Vitreous degeneration, left eye: Secondary | ICD-10-CM | POA: Diagnosis not present

## 2021-08-07 DIAGNOSIS — H2513 Age-related nuclear cataract, bilateral: Secondary | ICD-10-CM | POA: Diagnosis not present

## 2021-08-11 ENCOUNTER — Other Ambulatory Visit: Payer: Self-pay | Admitting: Family Medicine

## 2021-08-11 DIAGNOSIS — Z1231 Encounter for screening mammogram for malignant neoplasm of breast: Secondary | ICD-10-CM

## 2021-08-24 ENCOUNTER — Telehealth: Payer: 59 | Admitting: Emergency Medicine

## 2021-08-24 DIAGNOSIS — U071 COVID-19: Secondary | ICD-10-CM | POA: Diagnosis not present

## 2021-08-24 MED ORDER — BENZONATATE 100 MG PO CAPS: 100.0000 mg | ORAL_CAPSULE | Freq: Two times a day (BID) | ORAL | 0 refills | Status: DC | PRN

## 2021-08-24 MED ORDER — NIRMATRELVIR/RITONAVIR (PAXLOVID)TABLET: 3.0000 | ORAL_TABLET | Freq: Two times a day (BID) | ORAL | 0 refills | Status: AC

## 2021-08-24 NOTE — Progress Notes (Signed)
Virtual Visit Consent   Debra Cannon, you are scheduled for a virtual visit with a [facility] provider today.     Just as with appointments in the office, your consent must be obtained to participate.  Your consent will be active for this visit and any virtual visit you may have with one of our providers in the next 365 days.     If you have a MyChart account, a copy of this consent can be sent to you electronically.  All virtual visits are billed to your insurance company just like a traditional visit in the office.    As this is a virtual visit, video technology does not allow for your provider to perform a traditional examination.  This may limit your provider's ability to fully assess your condition.  If your provider identifies any concerns that need to be evaluated in person or the need to arrange testing (such as labs, EKG, etc.), we will make arrangements to do so.     Although advances in technology are sophisticated, we cannot ensure that it will always work on either your end or our end.  If the connection with a video visit is poor, the visit may have to be switched to a telephone visit.  With either a video or telephone visit, we are not always able to ensure that we have a secure connection.     I need to obtain your verbal consent now.   Are you willing to proceed with your visit today?    Debra Cannon has provided verbal consent on 08/24/2021 for a virtual visit (video or telephone).   Debra Cannon, [place]   Date: 08/24/2021 10:39 AM   Virtual Visit via Video Note   I, Debra Cannon, connected with  Debra Cannon  ([id number], [birth year]) on 08/24/21 at 10:45 AM EST by a video-enabled telemedicine application and verified that I am speaking with the correct person using two identifiers.  Location: Patient: Virtual Visit Location Patient: Home Provider: Virtual Visit Location Provider: Home Office   I discussed the limitations of evaluation and management by  telemedicine and the availability of in person appointments. The patient expressed understanding and agreed to proceed.    History of Present Illness: Debra Cannon is a 65 y.o. who identifies as a female who was assigned female at birth, and is being seen today for sore throat, headache, cough, body aches, fatigue, and loss of smell x 1 day.  Denies known covid exposure, but reports sick exposure to mother.  Has tried OTC medications without relief.  Symptoms are made worse with activities.  Denies previous symptoms covid infection the past.   Denies sinus pain, rhinorrhea, SOB, wheezing, chest pain, nausea, changes in bowel or bladder habits.    ROS: As per HPI.  All other pertinent ROS negative.     HPI: HPI  Problems:  Patient Active Problem List   Diagnosis Date Noted   Chest pressure 01/12/2020   Lipoma of chest wall 01/06/2018   Major depression in full remission ([place]) 01/03/2018   Insomnia 11/02/2016   Herpes simplex 08/30/2015   Hypercholesteremia 08/30/2015   Menopause 08/30/2015   Migraine 08/30/2015    Allergies: No Known Allergies Medications:  Current Outpatient Medications:    benzonatate (TESSALON) 100 MG capsule, Take 1 capsule (100 mg total) by mouth 2 (two) times daily as needed for cough., Disp: 20 capsule, Rfl: 0   nirmatrelvir/ritonavir EUA (PAXLOVID) 20 x 150 MG & 10 x 100MG  TABS, Take 3 tablets by mouth 2 (two) times daily for 5 days. (Take nirmatrelvir 150 mg two tablets twice daily for 5 days and ritonavir 100 mg one tablet twice daily for 5 days) Patient eGFR is 66, Disp: 30 tablet, Rfl: 0   acyclovir (ZOVIRAX) 400 MG tablet, TAKE 1 TABLET (400 MG TOTAL) BY MOUTH 3 (THREE) TIMES DAILY., Disp: 90 tablet, Rfl: 2   B Complex Vitamins (VITAMIN B COMPLEX) TABS, Take 1 tablet by mouth daily., Disp: , Rfl:    Cholecalciferol (VITAMIN D PO), Take 2,000 Units by mouth daily. , Disp: , Rfl:    citalopram (CELEXA) 40 MG tablet, Take 1 tablet (40 mg total) by mouth daily.,  Disp: 90 tablet, Rfl: 1   COVID-19 At Home Antigen Test (CARESTART COVID-19 HOME TEST) KIT, use as directed within package instructions, Disp: 2 kit, Rfl: 0   cyclobenzaprine (FLEXERIL) 10 MG tablet, Take 1 tablet (10 mg total) by mouth at bedtime., Disp: 30 tablet, Rfl: 0   Erenumab-aooe 70 MG/ML SOAJ, INJECT 70 MG INTO THE SKIN EVERY 30 DAYS., Disp: 1 mL, Rfl: 12   Fexofenadine HCl (ALLEGRA PO), Take by mouth daily., Disp: , Rfl:    omeprazole (PRILOSEC) 20 MG capsule, Take 20 mg by mouth daily as needed. , Disp: , Rfl:    SUMAtriptan (IMITREX) 100 MG tablet, TAKE 1 TABLET BY MOUTH AS NEEDED. MAY REPEAT IN 2 HOURS IF HEADACHE PERSISTS OR RECURS., Disp: 9 tablet, Rfl: 12   zolmitriptan (ZOMIG) 5 MG tablet, TAKE 1 TABLET BY MOUTH DAILY AS NEEDED., Disp: 6 tablet, Rfl: 12  Current Facility-Administered Medications:    aspirin EC tablet 324 mg, 324 mg, Oral, Once, End, Christopher, MD  Observations/Objective: Patient is well-developed, well-nourished in no acute distress. Mildly fatigued appearing, but nontoxic Resting comfortably at home.  Head is normocephalic, atraumatic.  No labored breathing. Speaking in full sentences, and tolerating own secretions Speech is clear and coherent with logical content.  Patient is alert and oriented at baseline.    Assessment and Plan: 1. COVID-19 virus infection COVID test was positive You should remain isolated in your home for 5 days from symptom onset AND greater than 72 hours after symptoms resolution (absence of fever without the use of fever-reducing medication and improvement in respiratory symptoms), whichever is longer Get plenty of rest and push fluids Use zyrtec for nasal congestion, runny nose, and/or sore throat Use flonase for nasal congestion and runny nose Use medications daily for symptom relief Use OTC medications like ibuprofen or tylenol as needed fever or pain Follow up with PCP in 1-2 days via phone or e-visit for recheck and to  ensure symptoms are improving Call or go to the ED if you have any new or worsening symptoms such as fever, worsening cough, shortness of breath, chest tightness, chest pain, turning blue, changes in mental status, etc...    Follow Up Instructions: I discussed the assessment and treatment plan with the patient. The patient was provided an opportunity to ask questions and all were answered. The patient agreed with the plan and demonstrated an understanding of the instructions.  A copy of instructions were sent to the patient via MyChart unless otherwise noted below.   The patient was advised to call back or seek an in-person evaluation if the symptoms worsen or if the condition fails to improve as anticipated.  Time:  I spent 10 minutes with the patient via telehealth technology discussing the above problems/concerns.    [place],  PA-C

## 2021-08-24 NOTE — Patient Instructions (Signed)
[name], thank you for joining [name], PA-C for today's virtual visit.  While this provider is not your primary care provider (PCP), if your PCP is located in our provider database this encounter information will be shared with them immediately following your visit.  Consent: (Patient) Debra Cannon provided verbal consent for this virtual visit at the beginning of the encounter.  Current Medications:  Current Outpatient Medications:    benzonatate (TESSALON) 100 MG capsule, Take 1 capsule (100 mg total) by mouth 2 (two) times daily as needed for cough., Disp: 20 capsule, Rfl: 0   nirmatrelvir/ritonavir EUA (PAXLOVID) 20 x 150 MG & 10 x $Re'100MG'MFb$  TABS, Take 3 tablets by mouth 2 (two) times daily for 5 days. (Take nirmatrelvir 150 mg two tablets twice daily for 5 days and ritonavir 100 mg one tablet twice daily for 5 days) Patient eGFR is 66, Disp: 30 tablet, Rfl: 0   acyclovir (ZOVIRAX) 400 MG tablet, TAKE 1 TABLET (400 MG TOTAL) BY MOUTH 3 (THREE) TIMES DAILY., Disp: 90 tablet, Rfl: 2   B Complex Vitamins (VITAMIN B COMPLEX) TABS, Take 1 tablet by mouth daily., Disp: , Rfl:    Cholecalciferol (VITAMIN D PO), Take 2,000 Units by mouth daily. , Disp: , Rfl:    citalopram (CELEXA) 40 MG tablet, Take 1 tablet (40 mg total) by mouth daily., Disp: 90 tablet, Rfl: 1   COVID-19 At Home Antigen Test (CARESTART COVID-19 HOME TEST) KIT, use as directed within package instructions, Disp: 2 kit, Rfl: 0   cyclobenzaprine (FLEXERIL) 10 MG tablet, Take 1 tablet (10 mg total) by mouth at bedtime., Disp: 30 tablet, Rfl: 0   Erenumab-aooe 70 MG/ML SOAJ, INJECT 70 MG INTO THE SKIN EVERY 30 DAYS., Disp: 1 mL, Rfl: 12   Fexofenadine HCl (ALLEGRA PO), Take by mouth daily., Disp: , Rfl:    omeprazole (PRILOSEC) 20 MG capsule, Take 20 mg by mouth daily as needed. , Disp: , Rfl:    SUMAtriptan (IMITREX) 100 MG tablet, TAKE 1 TABLET BY MOUTH AS NEEDED. MAY REPEAT IN 2 HOURS IF HEADACHE PERSISTS OR RECURS.,  Disp: 9 tablet, Rfl: 12   zolmitriptan (ZOMIG) 5 MG tablet, TAKE 1 TABLET BY MOUTH DAILY AS NEEDED., Disp: 6 tablet, Rfl: 12  Current Facility-Administered Medications:    aspirin EC tablet 324 mg, 324 mg, Oral, Once, End, Christopher, MD   Medications ordered in this encounter:  Meds ordered this encounter  Medications   nirmatrelvir/ritonavir EUA (PAXLOVID) 20 x 150 MG & 10 x $Re'100MG'dWd$  TABS    Sig: Take 3 tablets by mouth 2 (two) times daily for 5 days. (Take nirmatrelvir 150 mg two tablets twice daily for 5 days and ritonavir 100 mg one tablet twice daily for 5 days) Patient eGFR is 66    Dispense:  30 tablet    Refill:  0    Order Specific Question:   Supervising Provider    Answer:   Sabra Heck, BRIAN [[id number]]   benzonatate (TESSALON) 100 MG capsule    Sig: Take 1 capsule (100 mg total) by mouth 2 (two) times daily as needed for cough.    Dispense:  20 capsule    Refill:  0    Order Specific Question:   Supervising Provider    Answer:   Sabra Heck, [place]     *If you need refills on other medications prior to your next appointment, please contact your pharmacy*  Follow-Up: Call back or seek an in-person evaluation if the symptoms  worsen or if the condition fails to improve as anticipated.  Other Instructions COVID test was positive You should remain isolated in your home for 5 days from symptom onset AND greater than 72 hours after symptoms resolution (absence of fever without the use of fever-reducing medication and improvement in respiratory symptoms), whichever is longer Get plenty of rest and push fluids Use zyrtec for nasal congestion, runny nose, and/or sore throat Use flonase for nasal congestion and runny nose Use medications daily for symptom relief Use OTC medications like ibuprofen or tylenol as needed fever or pain Follow up with PCP in 1-2 days via phone or e-visit for recheck and to ensure symptoms are improving Call or go to the ED if you have any new or worsening  symptoms such as fever, worsening cough, shortness of breath, chest tightness, chest pain, turning blue, changes in mental status, etc...     If you have been instructed to have an in-person evaluation today at a local Urgent Care facility, please use the link below. It will take you to a list of all of our available [facility], including address, phone number and hours of operation. Please do not delay care.  [facility]  If you or a family member do not have a primary care provider, use the link below to schedule a visit and establish care. When you choose a [facility] primary care physician or advanced practice provider, you gain a long-term partner in health. Find a Primary Care Provider  Learn more about [facility] in-office and virtual care options: [facility]

## 2021-08-27 ENCOUNTER — Encounter: Payer: Self-pay | Admitting: Family Medicine

## 2021-09-09 ENCOUNTER — Ambulatory Visit (INDEPENDENT_AMBULATORY_CARE_PROVIDER_SITE_OTHER): Payer: 59

## 2021-09-09 ENCOUNTER — Other Ambulatory Visit: Payer: Self-pay

## 2021-09-09 DIAGNOSIS — Z23 Encounter for immunization: Secondary | ICD-10-CM | POA: Diagnosis not present

## 2021-09-12 ENCOUNTER — Other Ambulatory Visit: Payer: Self-pay

## 2021-09-12 MED FILL — Erenumab-aooe Subcutaneous Soln Auto-Injector 70 MG/ML: SUBCUTANEOUS | 30 days supply | Qty: 1 | Fill #6 | Status: AC

## 2021-09-12 MED FILL — Sumatriptan Succinate Tab 100 MG: ORAL | 30 days supply | Qty: 9 | Fill #6 | Status: AC

## 2021-09-12 MED FILL — Zolmitriptan Tab 5 MG: ORAL | 30 days supply | Qty: 6 | Fill #6 | Status: AC

## 2021-10-16 MED FILL — Erenumab-aooe Subcutaneous Soln Auto-Injector 70 MG/ML: SUBCUTANEOUS | 30 days supply | Qty: 1 | Fill #7 | Status: AC

## 2021-10-17 ENCOUNTER — Other Ambulatory Visit: Payer: Self-pay

## 2021-10-17 ENCOUNTER — Ambulatory Visit: Payer: Self-pay

## 2021-10-17 MED FILL — Zolmitriptan Tab 5 MG: ORAL | 30 days supply | Qty: 6 | Fill #7 | Status: AC

## 2021-10-17 MED FILL — Sumatriptan Succinate Tab 100 MG: ORAL | 30 days supply | Qty: 9 | Fill #7 | Status: AC

## 2021-10-21 ENCOUNTER — Ambulatory Visit
Admission: RE | Admit: 2021-10-21 | Discharge: 2021-10-21 | Disposition: A | Discharge status: Home / Self Care | Payer: No Typology Code available for payment source | Source: Ambulatory Visit | Attending: Family Medicine | Admitting: Family Medicine

## 2021-10-21 ENCOUNTER — Other Ambulatory Visit: Payer: Self-pay

## 2021-10-21 DIAGNOSIS — Z1231 Encounter for screening mammogram for malignant neoplasm of breast: Secondary | ICD-10-CM | POA: Diagnosis present

## 2021-11-14 ENCOUNTER — Other Ambulatory Visit: Payer: Self-pay

## 2021-11-14 MED ORDER — CARESTART COVID-19 HOME TEST VI KIT
PACK | 0 refills | Status: DC
  Filled 2021-11-14: qty 2, 4d supply, fill #0

## 2021-11-14 MED FILL — Zolmitriptan Tab 5 MG: ORAL | 30 days supply | Qty: 6 | Fill #8 | Status: AC

## 2021-11-14 MED FILL — Sumatriptan Succinate Tab 100 MG: ORAL | 30 days supply | Qty: 9 | Fill #8 | Status: AC

## 2021-11-14 MED FILL — Erenumab-aooe Subcutaneous Soln Auto-Injector 70 MG/ML: SUBCUTANEOUS | 30 days supply | Qty: 1 | Fill #8 | Status: AC

## 2021-12-02 ENCOUNTER — Other Ambulatory Visit: Payer: Self-pay

## 2021-12-02 ENCOUNTER — Encounter: Payer: Self-pay | Admitting: Family Medicine

## 2021-12-02 ENCOUNTER — Ambulatory Visit (INDEPENDENT_AMBULATORY_CARE_PROVIDER_SITE_OTHER): Payer: No Typology Code available for payment source | Admitting: Family Medicine

## 2021-12-02 VITALS — BP 97/62 | HR 67 | Temp 98.5°F | Ht 67.5 in | Wt 176.4 lb

## 2021-12-02 DIAGNOSIS — E7800 Pure hypercholesterolemia, unspecified: Secondary | ICD-10-CM | POA: Diagnosis not present

## 2021-12-02 DIAGNOSIS — G43909 Migraine, unspecified, not intractable, without status migrainosus: Secondary | ICD-10-CM

## 2021-12-02 DIAGNOSIS — F325 Major depressive disorder, single episode, in full remission: Secondary | ICD-10-CM | POA: Diagnosis not present

## 2021-12-02 DIAGNOSIS — B009 Herpesviral infection, unspecified: Secondary | ICD-10-CM

## 2021-12-02 MED ORDER — SUMATRIPTAN SUCCINATE 100 MG PO TABS
ORAL_TABLET | ORAL | 12 refills | Status: DC
  Filled 2021-12-02: qty 9, 30d supply, fill #0
  Filled 2022-01-26: qty 9, 30d supply, fill #1
  Filled 2022-03-03: qty 9, 30d supply, fill #2
  Filled 2022-04-13: qty 9, 30d supply, fill #3

## 2021-12-02 MED ORDER — LORAZEPAM 0.5 MG PO TABS
0.5000 mg | ORAL_TABLET | Freq: Two times a day (BID) | ORAL | 0 refills | Status: DC | PRN
  Filled 2021-12-02: qty 30, 15d supply, fill #0

## 2021-12-02 MED ORDER — ERENUMAB-AOOE 70 MG/ML ~~LOC~~ SOAJ
SUBCUTANEOUS | 12 refills | Status: AC
  Filled 2021-12-02: qty 1, 30d supply, fill #0
  Filled 2022-01-23: qty 1, 30d supply, fill #1
  Filled 2022-02-27: qty 1, 30d supply, fill #2
  Filled 2022-04-01: qty 1, 30d supply, fill #3
  Filled 2022-06-12: qty 1, 30d supply, fill #4
  Filled 2022-09-18: qty 1, 30d supply, fill #5

## 2021-12-02 MED ORDER — CYCLOBENZAPRINE HCL 10 MG PO TABS
10.0000 mg | ORAL_TABLET | Freq: Every day | ORAL | 0 refills | Status: DC
Start: 2021-12-02 — End: 2024-08-01
  Filled 2021-12-02: qty 30, 30d supply, fill #0

## 2021-12-02 MED ORDER — ZOLMITRIPTAN 5 MG PO TABS
ORAL_TABLET | Freq: Every day | ORAL | 12 refills | Status: DC | PRN
  Filled 2021-12-02: qty 6, 15d supply, fill #0
  Filled 2022-01-26: qty 6, 15d supply, fill #1
  Filled 2022-03-03: qty 6, 15d supply, fill #2
  Filled 2022-04-13: qty 6, 15d supply, fill #3

## 2021-12-02 MED ORDER — CITALOPRAM HYDROBROMIDE 40 MG PO TABS
60.0000 mg | ORAL_TABLET | Freq: Every day | ORAL | 1 refills | Status: DC
  Filled 2021-12-02 – 2022-01-26 (×2): qty 135, 90d supply, fill #0

## 2021-12-02 MED ORDER — ACYCLOVIR 400 MG PO TABS
ORAL_TABLET | ORAL | 2 refills | Status: DC
  Filled 2021-12-02: qty 90, 30d supply, fill #0
  Filled 2022-01-26: qty 90, 30d supply, fill #1

## 2021-12-02 NOTE — Progress Notes (Addendum)
BP 97/62    Pulse 67    Temp 98.5 F (36.9 C) (Oral)    Ht 5' 7.5" (1.715 m)    Wt 176 lb 6.4 oz (80 kg)    LMP  (LMP Unknown)    SpO2 97%    BMI 27.22 kg/m    Subjective:    Patient ID: Debra Cannon, female    DOB: [birth year], 66 y.o.   MRN: [id number]  HPI: Debra Cannon is a 66 y.o. female  Chief Complaint  Patient presents with   Migraine   Depression   Stress    Patient states she recently loss her mother and was stress with the workload, but she says she feels a bit better from the stress of being her mother's primary caregiver.    HYPERLIPIDEMIA Hyperlipidemia status: stable Satisfied with current treatment?  yes Side effects:  no Medication compliance: not on anything Supplements: none Aspirin:  no The 10-year ASCVD risk score (Arnett DK, et al., 2019) is: 3.5%   Values used to calculate the score:     Age: 45 years     Sex: Female     Is Non-Hispanic African American: No     Diabetic: No     Tobacco smoker: No     Systolic Blood Pressure: 97 mmHg     Is BP treated: No     HDL Cholesterol: 33 mg/dL     Total Cholesterol: 204 mg/dL Chest pain:  no Coronary artery disease:  no  Migraines are doing well. Stable on current regimen. No issues with side effects.   DEPRESSION- lost her Mom in December. She has been the executor of her will, which has been a lot.  Mood status: exacerbated Satisfied with current treatment?: no Symptom severity: moderate  Duration of current treatment : chronic Side effects: no Medication compliance: excellent compliance Psychotherapy/counseling: no  Previous psychiatric medications: celexa Depressed mood: yes Anxious mood: yes Anhedonia: no Significant weight loss or gain: yes Insomnia: no  Fatigue: yes Feelings of worthlessness or guilt: no Impaired concentration/indecisiveness: no Suicidal ideations: no Hopelessness: no Crying spells: no Depression screen [facility] 2/9 12/02/2021 06/03/2021 05/30/2020 11/30/2019 05/25/2019   Decreased Interest 1 0 0 1 1  Down, Depressed, Hopeless 1 0 0 0 1  PHQ - 2 Score 2 0 0 1 2  Altered sleeping 1 0 0 1 1  Tired, decreased energy 2 0 '1 1 1  ' Change in appetite 0 0 0 0 0  Feeling bad or failure about yourself  0 0 0 0 1  Trouble concentrating 1 0 '1 1 2  ' Moving slowly or fidgety/restless 0 0 0 0 0  Suicidal thoughts 0 0 0 0 0  PHQ-9 Score 6 0 '2 4 7  ' Difficult doing work/chores Somewhat difficult Not difficult at all - - Very difficult  Some recent data might be hidden   GAD 7 : Generalized Anxiety Score 12/02/2021 11/30/2019 05/25/2019 10/28/2018  Nervous, Anxious, on Edge 2 0 3 1  Control/stop worrying '2 1 3 1  ' Worry too much - different things 1 0 3 1  Trouble relaxing 0 1 2 0  Restless 0 0 0 0  Easily annoyed or irritable 1 0 1 1  Afraid - awful might happen 0 0 1 0  Total GAD 7 Score '6 2 13 4  ' Anxiety Difficulty Somewhat difficult - Very difficult Somewhat difficult      Relevant past medical, surgical, family and social history reviewed and  updated as indicated. Interim medical history since our last visit reviewed. Allergies and medications reviewed and updated.  Review of Systems  Constitutional: Negative.   Respiratory: Negative.    Cardiovascular: Negative.   Musculoskeletal: Negative.   Skin: Negative.   Neurological: Negative.   Psychiatric/Behavioral:  Positive for dysphoric mood. Negative for agitation, behavioral problems, confusion, decreased concentration, hallucinations, self-injury, sleep disturbance and suicidal ideas. The patient is nervous/anxious. The patient is not hyperactive.    Per HPI unless specifically indicated above     Objective:    BP 97/62    Pulse 67    Temp 98.5 F (36.9 C) (Oral)    Ht 5' 7.5" (1.715 m)    Wt 176 lb 6.4 oz (80 kg)    LMP  (LMP Unknown)    SpO2 97%    BMI 27.22 kg/m   Wt Readings from Last 3 Encounters:  12/02/21 176 lb 6.4 oz (80 kg)  06/03/21 186 lb 6.4 oz (84.6 kg)  05/30/20 187 lb 3.2 oz (84.9 kg)     Physical Exam Vitals and nursing note reviewed.  Constitutional:      General: She is not in acute distress.    Appearance: Normal appearance. She is not ill-appearing, toxic-appearing or diaphoretic.  HENT:     Head: Normocephalic and atraumatic.     Right Ear: External ear normal.     Left Ear: External ear normal.     Nose: Nose normal.     Mouth/Throat:     Mouth: Mucous membranes are moist.     Pharynx: Oropharynx is clear.  Eyes:     General: No scleral icterus.       Right eye: No discharge.        Left eye: No discharge.     Extraocular Movements: Extraocular movements intact.     Conjunctiva/sclera: Conjunctivae normal.     Pupils: Pupils are equal, round, and reactive to light.  Cardiovascular:     Rate and Rhythm: Normal rate and regular rhythm.     Pulses: Normal pulses.     Heart sounds: Normal heart sounds. No murmur heard.   No friction rub. No gallop.  Pulmonary:     Effort: Pulmonary effort is normal. No respiratory distress.     Breath sounds: Normal breath sounds. No stridor. No wheezing, rhonchi or rales.  Chest:     Chest wall: No tenderness.  Musculoskeletal:        General: Normal range of motion.     Cervical back: Normal range of motion and neck supple.  Skin:    General: Skin is warm and dry.     Capillary Refill: Capillary refill takes less than 2 seconds.     Coloration: Skin is not jaundiced or pale.     Findings: No bruising, erythema, lesion or rash.  Neurological:     General: No focal deficit present.     Mental Status: She is alert and oriented to person, place, and time. Mental status is at baseline.  Psychiatric:        Mood and Affect: Mood normal.        Behavior: Behavior normal.        Thought Content: Thought content normal.        Judgment: Judgment normal.    Results for orders placed or performed in visit on 06/03/21  CBC with Differential/Platelet  Result Value Ref Range   WBC 8.1 3.4 - 10.8 x10E3/uL   RBC 4.49 3.77  -  5.28 x10E6/uL   Hemoglobin 13.8 11.1 - 15.9 g/dL   Hematocrit 40.4 34.0 - 46.6 %   MCV 90 79 - 97 fL   MCH 30.7 26.6 - 33.0 pg   MCHC 34.2 31.5 - 35.7 g/dL   RDW 11.9 11.7 - 15.4 %   Platelets 354 150 - 450 x10E3/uL   Neutrophils 61 Not Estab. %   Lymphs 28 Not Estab. %   Monocytes 7 Not Estab. %   Eos 3 Not Estab. %   Basos 1 Not Estab. %   Neutrophils Absolute 5.0 1.4 - 7.0 x10E3/uL   Lymphocytes Absolute 2.3 0.7 - 3.1 x10E3/uL   Monocytes Absolute 0.6 0.1 - 0.9 x10E3/uL   EOS (ABSOLUTE) 0.2 0.0 - 0.4 x10E3/uL   Basophils Absolute 0.0 0.0 - 0.2 x10E3/uL   Immature Granulocytes 0 Not Estab. %   Immature Grans (Abs) 0.0 0.0 - 0.1 x10E3/uL  Comprehensive metabolic panel  Result Value Ref Range   Glucose 81 65 - 99 mg/dL   BUN 15 8 - 27 mg/dL   Creatinine, Ser 0.96 0.57 - 1.00 mg/dL   eGFR 66 >59 mL/min/1.73   BUN/Creatinine Ratio 16 12 - 28   Sodium 139 134 - 144 mmol/L   Potassium 4.3 3.5 - 5.2 mmol/L   Chloride 99 96 - 106 mmol/L   CO2 25 20 - 29 mmol/L   Calcium 9.5 8.7 - 10.3 mg/dL   Total Protein 6.4 6.0 - 8.5 g/dL   Albumin 4.5 3.8 - 4.8 g/dL   Globulin, Total 1.9 1.5 - 4.5 g/dL   Albumin/Globulin Ratio 2.4 (H) 1.2 - 2.2   Bilirubin Total 0.5 0.0 - 1.2 mg/dL   Alkaline Phosphatase 105 44 - 121 IU/L   AST 13 0 - 40 IU/L   ALT 13 0 - 32 IU/L  Lipid Panel w/o Chol/HDL Ratio  Result Value Ref Range   Cholesterol, Total 204 (H) 100 - 199 mg/dL   Triglycerides 311 (H) 0 - 149 mg/dL   HDL 33 (L) >39 mg/dL   VLDL Cholesterol Cal 54 (H) 5 - 40 mg/dL   LDL Chol Calc (NIH) 117 (H) 0 - 99 mg/dL  Urinalysis, Routine w reflex microscopic  Result Value Ref Range   Specific Gravity, UA <1.005 (L) 1.005 - 1.030   pH, UA 5.0 5.0 - 7.5   Color, UA Yellow Yellow   Appearance Ur Clear Clear   Leukocytes,UA Negative Negative   Protein,UA Negative Negative/Trace   Glucose, UA Negative Negative   Ketones, UA Negative Negative   RBC, UA Negative Negative   Bilirubin, UA  Negative Negative   Urobilinogen, Ur 0.2 0.2 - 1.0 mg/dL   Nitrite, UA Negative Negative  TSH  Result Value Ref Range   TSH 1.190 0.450 - 4.500 uIU/mL      Assessment & Plan:   Problem List Items Addressed This Visit       Cardiovascular and Mediastinum   Migraine    Under good control on current regimen. Continue current regimen. Continue to monitor. Call with any concerns. Refills given.        Relevant Medications   citalopram (CELEXA) 40 MG tablet   Erenumab-aooe 70 MG/ML SOAJ   SUMAtriptan (IMITREX) 100 MG tablet   zolmitriptan (ZOMIG) 5 MG tablet   cyclobenzaprine (FLEXERIL) 10 MG tablet     Other   Herpes simplex    Under good control on current regimen. Continue current regimen. Continue to monitor. Call with any concerns. Refills  given.        Relevant Medications   acyclovir (ZOVIRAX) 400 MG tablet   Hypercholesteremia - Primary    Rechecking labs today. Await results. Treat as needed.       Relevant Orders   Comprehensive metabolic panel   Lipid Panel w/o Chol/HDL Ratio   Major depression in full remission ([place])    In exacerbation with the death of her mom. Will increase her celexa to 52m and add PRN lorazepam. Call with any concerns or if not improving.       Relevant Medications   citalopram (CELEXA) 40 MG tablet   LORazepam (ATIVAN) 0.5 MG tablet     Follow up plan: Return in about 6 months (around 06/01/2022) for Physical.

## 2021-12-02 NOTE — Assessment & Plan Note (Signed)
Under good control on current regimen. Continue current regimen. Continue to monitor. Call with any concerns. Refills given.   

## 2021-12-02 NOTE — Assessment & Plan Note (Signed)
Rechecking labs today. Await results. Treat as needed.  °

## 2021-12-02 NOTE — Assessment & Plan Note (Signed)
In exacerbation with the death of her mom. Will increase her celexa to 60mg  and add PRN lorazepam. Call with any concerns or if not improving.

## 2021-12-03 LAB — COMPREHENSIVE METABOLIC PANEL
ALT: 13 IU/L (ref 0–32)
AST: 14 IU/L (ref 0–40)
Albumin/Globulin Ratio: 2.2 (ref 1.2–2.2)
Albumin: 4.3 g/dL (ref 3.8–4.8)
Alkaline Phosphatase: 97 IU/L (ref 44–121)
BUN/Creatinine Ratio: 25 (ref 12–28)
BUN: 17 mg/dL (ref 8–27)
Bilirubin Total: <0.2 mg/dL (ref 0.0–1.2)
CO2: 20 mmol/L (ref 20–29)
Calcium: 9.2 mg/dL (ref 8.7–10.3)
Chloride: 99 mmol/L (ref 96–106)
Creatinine, Ser: 0.69 mg/dL (ref 0.57–1.00)
Globulin, Total: 2 g/dL (ref 1.5–4.5)
Glucose: 88 mg/dL (ref 70–99)
Potassium: 4.5 mmol/L (ref 3.5–5.2)
Sodium: 135 mmol/L (ref 134–144)
Total Protein: 6.3 g/dL (ref 6.0–8.5)
eGFR: 97 mL/min/{1.73_m2} (ref >59)

## 2021-12-03 LAB — LIPID PANEL W/O CHOL/HDL RATIO
Cholesterol, Total: 208 mg/dL — ABNORMAL HIGH (ref 100–199)
HDL: 31 mg/dL — ABNORMAL LOW (ref >39)
LDL Chol Calc (NIH): 98 mg/dL (ref 0–99)
Triglycerides: 470 mg/dL — ABNORMAL HIGH (ref 0–149)
VLDL Cholesterol Cal: 79 mg/dL — ABNORMAL HIGH (ref 5–40)

## 2021-12-05 ENCOUNTER — Other Ambulatory Visit: Payer: Self-pay

## 2021-12-08 ENCOUNTER — Other Ambulatory Visit: Payer: Self-pay

## 2022-01-26 ENCOUNTER — Other Ambulatory Visit: Payer: Self-pay

## 2022-01-27 ENCOUNTER — Other Ambulatory Visit: Payer: Self-pay

## 2022-02-27 ENCOUNTER — Other Ambulatory Visit: Payer: Self-pay

## 2022-03-03 ENCOUNTER — Other Ambulatory Visit: Payer: Self-pay

## 2022-04-01 ENCOUNTER — Other Ambulatory Visit: Payer: Self-pay

## 2022-04-13 ENCOUNTER — Other Ambulatory Visit: Payer: Self-pay

## 2022-06-01 ENCOUNTER — Encounter: Payer: No Typology Code available for payment source | Admitting: Family Medicine

## 2022-06-05 ENCOUNTER — Other Ambulatory Visit: Payer: Self-pay

## 2022-06-05 ENCOUNTER — Encounter: Payer: Self-pay | Admitting: Family Medicine

## 2022-06-05 ENCOUNTER — Ambulatory Visit (INDEPENDENT_AMBULATORY_CARE_PROVIDER_SITE_OTHER): Payer: No Typology Code available for payment source | Admitting: Family Medicine

## 2022-06-05 VITALS — BP 122/78 | HR 57 | Temp 98.2°F | Ht 68.0 in | Wt 180.4 lb

## 2022-06-05 DIAGNOSIS — G43909 Migraine, unspecified, not intractable, without status migrainosus: Secondary | ICD-10-CM | POA: Diagnosis not present

## 2022-06-05 DIAGNOSIS — E7800 Pure hypercholesterolemia, unspecified: Secondary | ICD-10-CM

## 2022-06-05 DIAGNOSIS — Z23 Encounter for immunization: Secondary | ICD-10-CM

## 2022-06-05 DIAGNOSIS — F325 Major depressive disorder, single episode, in full remission: Secondary | ICD-10-CM | POA: Diagnosis not present

## 2022-06-05 DIAGNOSIS — F5101 Primary insomnia: Secondary | ICD-10-CM | POA: Diagnosis not present

## 2022-06-05 DIAGNOSIS — Z0000 Encounter for general adult medical examination without abnormal findings: Secondary | ICD-10-CM

## 2022-06-05 LAB — URINALYSIS, ROUTINE W REFLEX MICROSCOPIC
Bilirubin, UA: NEGATIVE
Glucose, UA: NEGATIVE
Ketones, UA: NEGATIVE
Leukocytes,UA: NEGATIVE
Nitrite, UA: NEGATIVE
Protein,UA: NEGATIVE
RBC, UA: NEGATIVE
Specific Gravity, UA: <1.005 — ABNORMAL LOW (ref 1.005–1.030)
Urobilinogen, Ur: 0.2 mg/dL (ref 0.2–1.0)
pH, UA: 5.5 (ref 5.0–7.5)

## 2022-06-05 MED ORDER — LORAZEPAM 0.5 MG PO TABS
0.5000 mg | ORAL_TABLET | Freq: Two times a day (BID) | ORAL | 0 refills | Status: DC | PRN
  Filled 2022-06-05: qty 30, 15d supply, fill #0

## 2022-06-05 MED ORDER — SUMATRIPTAN SUCCINATE 100 MG PO TABS
ORAL_TABLET | ORAL | 12 refills | Status: DC
  Filled 2022-06-05: qty 9, 30d supply, fill #0
  Filled 2022-08-12: qty 9, 30d supply, fill #1
  Filled 2022-09-18: qty 9, 30d supply, fill #2
  Filled 2022-11-10 – 2022-11-11 (×2): qty 9, 30d supply, fill #3
  Filled 2023-02-04: qty 9, 30d supply, fill #4
  Filled 2023-03-08: qty 9, 30d supply, fill #5
  Filled 2023-04-27: qty 9, 30d supply, fill #6

## 2022-06-05 MED ORDER — CITALOPRAM HYDROBROMIDE 40 MG PO TABS
60.0000 mg | ORAL_TABLET | Freq: Every day | ORAL | 1 refills | Status: DC
  Filled 2022-06-05: qty 135, 90d supply, fill #0
  Filled 2022-09-18: qty 135, 90d supply, fill #1

## 2022-06-05 MED ORDER — ZOLMITRIPTAN 5 MG PO TABS
ORAL_TABLET | Freq: Every day | ORAL | 12 refills | Status: DC | PRN
  Filled 2022-06-05: qty 6, 15d supply, fill #0
  Filled 2022-08-12: qty 6, 15d supply, fill #1
  Filled 2022-09-18: qty 6, 15d supply, fill #2
  Filled 2022-11-10 – 2022-11-13 (×2): qty 6, 15d supply, fill #3
  Filled 2023-02-04: qty 6, 15d supply, fill #4
  Filled 2023-03-08: qty 6, 15d supply, fill #5
  Filled 2023-04-27: qty 6, 15d supply, fill #6

## 2022-06-05 MED ORDER — ACYCLOVIR 400 MG PO TABS
ORAL_TABLET | ORAL | 2 refills | Status: DC
  Filled 2022-06-05: qty 90, 30d supply, fill #0
  Filled 2022-09-18: qty 90, 30d supply, fill #1

## 2022-06-05 NOTE — Progress Notes (Signed)
BP 122/78   Pulse (!) 57   Temp 98.2 F (36.8 C)   Ht '5\' 8"'$  (1.727 m)   Wt 180 lb 6.4 oz (81.8 kg)   LMP  (LMP Unknown)   SpO2 98%   BMI 27.43 kg/m    Subjective:    Patient ID: Debra Cannon, female    DOB: 1956/08/15, 66 y.o.   MRN: [id number]  HPI: Debra Cannon is a 66 y.o. female presenting on 06/05/2022 for comprehensive medical examination. Current medical complaints include:  DEPRESSION Mood status: controlled Satisfied with current treatment?: yes Symptom severity: mild  Duration of current treatment : chronic Side effects: no Medication compliance: excellent compliance Psychotherapy/counseling: no  Previous psychiatric medications: celexa Depressed mood: no Anxious mood: no Anhedonia: no Significant weight loss or gain: no Insomnia: no  Fatigue: no Feelings of worthlessness or guilt: no Impaired concentration/indecisiveness: no Suicidal ideations: no Hopelessness: no Crying spells: no    06/05/2022    1:50 PM 12/02/2021    3:33 PM 06/03/2021    3:28 PM 05/30/2020    3:25 PM 11/30/2019    4:09 PM  Depression screen PHQ 2/9  Decreased Interest 1 1 0 0 1  Down, Depressed, Hopeless 1 1 0 0 0  PHQ - 2 Score 2 2 0 0 1  Altered sleeping 0 1 0 0 1  Tired, decreased energy 0 2 0 1 1  Change in appetite 0 0 0 0 0  Feeling bad or failure about yourself  0 0 0 0 0  Trouble concentrating 0 1 0 1 1  Moving slowly or fidgety/restless 0 0 0 0 0  Suicidal thoughts 0 0 0 0 0  PHQ-9 Score 2 6 0 2 4  Difficult doing work/chores Somewhat difficult Somewhat difficult Not difficult at all     She currently lives with: husband Menopausal Symptoms: no  Depression Screen done today and results listed below:     06/05/2022    1:50 PM 12/02/2021    3:33 PM 06/03/2021    3:28 PM 05/30/2020    3:25 PM 11/30/2019    4:09 PM  Depression screen PHQ 2/9  Decreased Interest 1 1 0 0 1  Down, Depressed, Hopeless 1 1 0 0 0  PHQ - 2 Score 2 2 0 0 1  Altered sleeping 0 1 0 0 1  Tired,  decreased energy 0 2 0 1 1  Change in appetite 0 0 0 0 0  Feeling bad or failure about yourself  0 0 0 0 0  Trouble concentrating 0 1 0 1 1  Moving slowly or fidgety/restless 0 0 0 0 0  Suicidal thoughts 0 0 0 0 0  PHQ-9 Score 2 6 0 2 4  Difficult doing work/chores Somewhat difficult Somewhat difficult Not difficult at all      Past Medical History:  Past Medical History:  Diagnosis Date   Herpes    Hypercholesteremia    Menopause    Migraine     Surgical History:  Past Surgical History:  Procedure Laterality Date   APPENDECTOMY     BREAST EXCISIONAL BIOPSY Right 2019   lipoma   COLONOSCOPY  09/03/2008   Dr Vira Agar   COLONOSCOPY WITH PROPOFOL N/A 06/23/2019   Procedure: COLONOSCOPY WITH PROPOFOL;  Surgeon: Jonathon Bellows, MD;  Location: [facility] ENDOSCOPY;  Service: Gastroenterology;  Laterality: N/A;   laporoscopy     REPAIR ANKLE LIGAMENT Left 2004 & 2005   UPPER GI ENDOSCOPY  2009   Dr Vira Agar    Medications:  Current Outpatient Medications on File Prior to Visit  Medication Sig   B Complex Vitamins (VITAMIN B COMPLEX) TABS Take 1 tablet by mouth daily.   Cholecalciferol (VITAMIN D PO) Take 2,000 Units by mouth daily.    cyclobenzaprine (FLEXERIL) 10 MG tablet Take 1 tablet (10 mg total) by mouth at bedtime.   Erenumab-aooe 70 MG/ML SOAJ INJECT 70 MG INTO THE SKIN EVERY 30 DAYS.   Fexofenadine HCl (ALLEGRA PO) Take by mouth daily.   omeprazole (PRILOSEC) 20 MG capsule Take 20 mg by mouth daily as needed.    Current Facility-Administered Medications on File Prior to Visit  Medication   aspirin EC tablet 324 mg    Allergies:  No Known Allergies  Social History:  Social History   Socioeconomic History   Marital status: Married    Spouse name: Not on file   Number of children: Not on file   Years of education: Not on file   Highest education level: Not on file  Occupational History   Not on file  Tobacco Use   Smoking status: Former    Packs/day: 1.00     Years: 20.00    Total pack years: 20.00    Types: Cigarettes    Quit date: 2000    Years since quitting: 23.6   Smokeless tobacco: Never  Vaping Use   Vaping Use: Never used  Substance and Sexual Activity   Alcohol use: Not Currently    Alcohol/week: 0.0 standard drinks of alcohol    Comment: 2 drinks per year   Drug use: No   Sexual activity: Yes  Other Topics Concern   Not on file  Social History Narrative   Not on file   Social Determinants of Health   Financial Resource Strain: Not on file  Food Insecurity: Not on file  Transportation Needs: Not on file  Physical Activity: Not on file  Stress: Not on file  Social Connections: Not on file  Intimate Partner Violence: Not on file   Social History   Tobacco Use  Smoking Status Former   Packs/day: 1.00   Years: 20.00   Total pack years: 20.00   Types: Cigarettes   Quit date: 2000   Years since quitting: 23.6  Smokeless Tobacco Never   Social History   Substance and Sexual Activity  Alcohol Use Not Currently   Alcohol/week: 0.0 standard drinks of alcohol   Comment: 2 drinks per year    Family History:  Family History  Problem Relation Age of Onset   Osteoporosis Mother    Thyroid disease Mother    Heart disease Father    Heart attack Father 38   Cancer Sister        endometrial   Cardiomyopathy Brother    Cancer Maternal Grandmother        vulvar   Diabetes Maternal Grandmother    AAA (abdominal aortic aneurysm) Maternal Grandfather    Stroke Paternal Grandmother    Heart disease Paternal Grandfather    Breast cancer Neg Hx     Past medical history, surgical history, medications, allergies, family history and social history reviewed with patient today and changes made to appropriate areas of the chart.   Review of Systems  Constitutional: Negative.   HENT: Negative.    Eyes: Negative.   Respiratory: Negative.    Cardiovascular: Negative.   Gastrointestinal: Negative.   Genitourinary:  Negative.   Musculoskeletal: Negative.   Skin:  Negative.   Neurological: Negative.   Endo/Heme/Allergies:  Positive for environmental allergies. Negative for polydipsia. Does not bruise/bleed easily.  Psychiatric/Behavioral: Negative.     All other ROS negative except what is listed above and in the HPI.      Objective:    BP 122/78   Pulse (!) 57   Temp 98.2 F (36.8 C)   Ht '5\' 8"'$  (1.727 m)   Wt 180 lb 6.4 oz (81.8 kg)   LMP  (LMP Unknown)   SpO2 98%   BMI 27.43 kg/m   Wt Readings from Last 3 Encounters:  06/05/22 180 lb 6.4 oz (81.8 kg)  12/02/21 176 lb 6.4 oz (80 kg)  06/03/21 186 lb 6.4 oz (84.6 kg)    Physical Exam  Results for orders placed or performed in visit on 06/05/22  Urinalysis, Routine w reflex microscopic  Result Value Ref Range   Specific Gravity, UA <1.005 (L) 1.005 - 1.030   pH, UA 5.5 5.0 - 7.5   Color, UA Yellow Yellow   Appearance Ur Clear Clear   Leukocytes,UA Negative Negative   Protein,UA Negative Negative/Trace   Glucose, UA Negative Negative   Ketones, UA Negative Negative   RBC, UA Negative Negative   Bilirubin, UA Negative Negative   Urobilinogen, Ur 0.2 0.2 - 1.0 mg/dL   Nitrite, UA Negative Negative      Assessment & Plan:   Problem List Items Addressed This Visit       Cardiovascular and Mediastinum   Migraine    Under good control on current regimen. Continue current regimen. Continue to monitor. Call with any concerns. Refills given.        Relevant Medications   citalopram (CELEXA) 40 MG tablet   SUMAtriptan (IMITREX) 100 MG tablet   zolmitriptan (ZOMIG) 5 MG tablet     Other   Hypercholesteremia    Rechecking labs today. Await results. Treat as needed.       Relevant Orders   Comprehensive metabolic panel   Lipid Panel w/o Chol/HDL Ratio   Insomnia    Under good control on current regimen. Continue current regimen. Continue to monitor. Call with any concerns. Refills given.        Major depression in full  remission ([place])    Under good control on current regimen. Continue current regimen. Continue to monitor. Call with any concerns. Refills given.        Relevant Medications   citalopram (CELEXA) 40 MG tablet   LORazepam (ATIVAN) 0.5 MG tablet   Other Visit Diagnoses     Routine general medical examination at a health care facility    -  Primary   Vaccines up to date. Screening labs checked today. Pap, mammo and colonoscopy up to date. Continue diet and exercise. Call with any concerns.    Relevant Orders   CBC with Differential/Platelet   Comprehensive metabolic panel   Lipid Panel w/o Chol/HDL Ratio   Urinalysis, Routine w reflex microscopic (Completed)   TSH        Follow up plan: Return in about 6 months (around 12/04/2022).   LABORATORY TESTING:  - Pap smear: up to date  IMMUNIZATIONS:   - Tdap: Tetanus vaccination status reviewed: last tetanus booster within 10 years. - Influenza: Administered today - Pneumovax: Not applicable - Prevnar: Not applicable - COVID: Up to date - HPV: Not applicable - Shingrix vaccine: Up to date  SCREENING: -Mammogram: Up to date  - Colonoscopy: Up to date   PATIENT  COUNSELING:   Advised to take 1 mg of folate supplement per day if capable of pregnancy.   Sexuality: Discussed sexually transmitted diseases, partner selection, use of condoms, avoidance of unintended pregnancy  and contraceptive alternatives.   Advised to avoid cigarette smoking.  I discussed with the patient that most people either abstain from alcohol or drink within safe limits (<=14/week and <=4 drinks/occasion for males, <=7/weeks and <= 3 drinks/occasion for females) and that the risk for alcohol disorders and other health effects rises proportionally with the number of drinks per week and how often a drinker exceeds daily limits.  Discussed cessation/primary prevention of drug use and availability of treatment for abuse.   Diet: Encouraged to adjust caloric  intake to maintain  or achieve ideal body weight, to reduce intake of dietary saturated fat and total fat, to limit sodium intake by avoiding high sodium foods and not adding table salt, and to maintain adequate dietary potassium and calcium preferably from fresh fruits, vegetables, and low-fat dairy products.    stressed the importance of regular exercise  Injury prevention: Discussed safety belts, safety helmets, smoke detector, smoking near bedding or upholstery.   Dental health: Discussed importance of regular tooth brushing, flossing, and dental visits.    NEXT PREVENTATIVE PHYSICAL DUE IN 1 YEAR. Return in about 6 months (around 12/04/2022).

## 2022-06-05 NOTE — Assessment & Plan Note (Signed)
Under good control on current regimen. Continue current regimen. Continue to monitor. Call with any concerns. Refills given.   

## 2022-06-05 NOTE — Assessment & Plan Note (Signed)
Rechecking labs today. Await results. Treat as needed.  °

## 2022-06-06 LAB — LIPID PANEL W/O CHOL/HDL RATIO
Cholesterol, Total: 211 mg/dL — ABNORMAL HIGH (ref 100–199)
HDL: 36 mg/dL — ABNORMAL LOW (ref >39)
LDL Chol Calc (NIH): 123 mg/dL — ABNORMAL HIGH (ref 0–99)
Triglycerides: 297 mg/dL — ABNORMAL HIGH (ref 0–149)
VLDL Cholesterol Cal: 52 mg/dL — ABNORMAL HIGH (ref 5–40)

## 2022-06-06 LAB — CBC WITH DIFFERENTIAL/PLATELET
Basophils Absolute: 0 10*3/uL (ref 0.0–0.2)
Basos: 1 %
EOS (ABSOLUTE): 0.2 10*3/uL (ref 0.0–0.4)
Eos: 3 %
Hematocrit: 41.6 % (ref 34.0–46.6)
Hemoglobin: 14.5 g/dL (ref 11.1–15.9)
Immature Grans (Abs): 0 10*3/uL (ref 0.0–0.1)
Immature Granulocytes: 0 %
Lymphocytes Absolute: 2.5 10*3/uL (ref 0.7–3.1)
Lymphs: 32 %
MCH: 31.5 pg (ref 26.6–33.0)
MCHC: 34.9 g/dL (ref 31.5–35.7)
MCV: 90 fL (ref 79–97)
Monocytes Absolute: 0.8 10*3/uL (ref 0.1–0.9)
Monocytes: 10 %
Neutrophils Absolute: 4.3 10*3/uL (ref 1.4–7.0)
Neutrophils: 54 %
Platelets: 363 10*3/uL (ref 150–450)
RBC: 4.6 x10E6/uL (ref 3.77–5.28)
RDW: 11.8 % (ref 11.7–15.4)
WBC: 7.9 10*3/uL (ref 3.4–10.8)

## 2022-06-06 LAB — COMPREHENSIVE METABOLIC PANEL
ALT: 12 IU/L (ref 0–32)
AST: 14 IU/L (ref 0–40)
Albumin/Globulin Ratio: 2.6 — ABNORMAL HIGH (ref 1.2–2.2)
Albumin: 4.6 g/dL (ref 3.9–4.9)
Alkaline Phosphatase: 93 IU/L (ref 44–121)
BUN/Creatinine Ratio: 14 (ref 12–28)
BUN: 11 mg/dL (ref 8–27)
Bilirubin Total: 0.5 mg/dL (ref 0.0–1.2)
CO2: 23 mmol/L (ref 20–29)
Calcium: 9.3 mg/dL (ref 8.7–10.3)
Chloride: 98 mmol/L (ref 96–106)
Creatinine, Ser: 0.81 mg/dL (ref 0.57–1.00)
Globulin, Total: 1.8 g/dL (ref 1.5–4.5)
Glucose: 80 mg/dL (ref 70–99)
Potassium: 4.4 mmol/L (ref 3.5–5.2)
Sodium: 137 mmol/L (ref 134–144)
Total Protein: 6.4 g/dL (ref 6.0–8.5)
eGFR: 81 mL/min/{1.73_m2} (ref >59)

## 2022-06-06 LAB — TSH: TSH: 1.51 u[IU]/mL (ref 0.450–4.500)

## 2022-06-12 ENCOUNTER — Other Ambulatory Visit: Payer: Self-pay

## 2022-08-12 ENCOUNTER — Other Ambulatory Visit: Payer: Self-pay

## 2022-09-18 ENCOUNTER — Other Ambulatory Visit: Payer: Self-pay

## 2022-11-10 ENCOUNTER — Other Ambulatory Visit (HOSPITAL_COMMUNITY): Payer: Self-pay

## 2022-11-11 ENCOUNTER — Other Ambulatory Visit: Payer: Self-pay

## 2022-11-13 ENCOUNTER — Other Ambulatory Visit: Payer: Self-pay

## 2022-11-19 ENCOUNTER — Other Ambulatory Visit (HOSPITAL_COMMUNITY): Payer: Self-pay

## 2022-11-23 ENCOUNTER — Other Ambulatory Visit (HOSPITAL_COMMUNITY): Payer: Self-pay

## 2022-12-01 ENCOUNTER — Other Ambulatory Visit: Payer: Self-pay | Admitting: Family Medicine

## 2022-12-01 DIAGNOSIS — Z1231 Encounter for screening mammogram for malignant neoplasm of breast: Secondary | ICD-10-CM

## 2022-12-04 ENCOUNTER — Ambulatory Visit: Payer: 59 | Admitting: Family Medicine

## 2022-12-04 ENCOUNTER — Other Ambulatory Visit: Payer: Self-pay

## 2022-12-04 ENCOUNTER — Encounter: Payer: Self-pay | Admitting: Family Medicine

## 2022-12-04 VITALS — BP 120/76 | HR 60 | Temp 97.9°F | Ht 68.0 in | Wt 185.6 lb

## 2022-12-04 DIAGNOSIS — F325 Major depressive disorder, single episode, in full remission: Secondary | ICD-10-CM

## 2022-12-04 MED ORDER — CITALOPRAM HYDROBROMIDE 40 MG PO TABS
60.0000 mg | ORAL_TABLET | Freq: Every day | ORAL | 1 refills | Status: DC
  Filled 2022-12-04: qty 45, 30d supply, fill #0
  Filled 2023-03-08: qty 45, 30d supply, fill #1

## 2022-12-04 NOTE — Progress Notes (Unsigned)
BP 120/76   Pulse 60   Temp 97.9 F (36.6 C) (Oral)   Ht '5\' 8"'$  (1.727 m)   Wt 185 lb 9.6 oz (84.2 kg)   LMP  (LMP Unknown)   SpO2 98%   BMI 28.22 kg/m    Subjective:    Patient ID: Debra Cannon, female    DOB: [birth year], 67 y.o.   MRN: [id number]  HPI: Debra Cannon is a 67 y.o. female  Chief Complaint  Patient presents with   Depression   DEPRESSION PHQ 9 score 3  today, GAD 7 score 4 Taking 60 Citalopram daily Mood status: stable Satisfied with current treatment?: yes Symptom severity: mild  Duration of current treatment : chronic Side effects: no Medication compliance: excellent compliance Psychotherapy/counseling: yes in the past Depressed mood: no Anxious mood: no Anhedonia: no Significant weight loss or gain: no Insomnia: no  Fatigue: no Feelings of worthlessness or guilt: yes occasionally Impaired concentration/indecisiveness: no Suicidal ideations: no Hopelessness: yes, ocassionally Crying spells: no    12/04/2022    1:53 PM 06/05/2022    1:50 PM 12/02/2021    3:33 PM 06/03/2021    3:28 PM 05/30/2020    3:25 PM  Depression screen PHQ 2/9  Decreased Interest '1 1 1 '$ 0 0  Down, Depressed, Hopeless '1 1 1 '$ 0 0  PHQ - 2 Score '2 2 2 '$ 0 0  Altered sleeping 0 0 1 0 0  Tired, decreased energy 0 0 2 0 1  Change in appetite 0 0 0 0 0  Feeling bad or failure about yourself  1 0 0 0 0  Trouble concentrating 0 0 1 0 1  Moving slowly or fidgety/restless 0 0 0 0 0  Suicidal thoughts 0 0 0 0 0  PHQ-9 Score '3 2 6 '$ 0 2  Difficult doing work/chores Not difficult at all Somewhat difficult Somewhat difficult Not difficult at all      Relevant past medical, surgical, family and social history reviewed and updated as indicated. Interim medical history since our last visit reviewed. Allergies and medications reviewed and updated.  Review of Systems  Constitutional:  Negative for fatigue and unexpected weight change.  Psychiatric/Behavioral:  Negative for agitation,  decreased concentration, dysphoric mood, self-injury, sleep disturbance and suicidal ideas. The patient is not nervous/anxious.     Per HPI unless specifically indicated above     Objective:    BP 120/76   Pulse 60   Temp 97.9 F (36.6 C) (Oral)   Ht '5\' 8"'$  (1.727 m)   Wt 185 lb 9.6 oz (84.2 kg)   LMP  (LMP Unknown)   SpO2 98%   BMI 28.22 kg/m   Wt Readings from Last 3 Encounters:  12/04/22 185 lb 9.6 oz (84.2 kg)  06/05/22 180 lb 6.4 oz (81.8 kg)  12/02/21 176 lb 6.4 oz (80 kg)    Physical Exam  Results for orders placed or performed in visit on 06/05/22  CBC with Differential/Platelet  Result Value Ref Range   WBC 7.9 3.4 - 10.8 x10E3/uL   RBC 4.60 3.77 - 5.28 x10E6/uL   Hemoglobin 14.5 11.1 - 15.9 g/dL   Hematocrit 41.6 34.0 - 46.6 %   MCV 90 79 - 97 fL   MCH 31.5 26.6 - 33.0 pg   MCHC 34.9 31.5 - 35.7 g/dL   RDW 11.8 11.7 - 15.4 %   Platelets 363 150 - 450 x10E3/uL   Neutrophils 54 Not Estab. %   Lymphs 32  Not Estab. %   Monocytes 10 Not Estab. %   Eos 3 Not Estab. %   Basos 1 Not Estab. %   Neutrophils Absolute 4.3 1.4 - 7.0 x10E3/uL   Lymphocytes Absolute 2.5 0.7 - 3.1 x10E3/uL   Monocytes Absolute 0.8 0.1 - 0.9 x10E3/uL   EOS (ABSOLUTE) 0.2 0.0 - 0.4 x10E3/uL   Basophils Absolute 0.0 0.0 - 0.2 x10E3/uL   Immature Granulocytes 0 Not Estab. %   Immature Grans (Abs) 0.0 0.0 - 0.1 x10E3/uL  Comprehensive metabolic panel  Result Value Ref Range   Glucose 80 70 - 99 mg/dL   BUN 11 8 - 27 mg/dL   Creatinine, Ser 0.81 0.57 - 1.00 mg/dL   eGFR 81 >59 mL/min/1.73   BUN/Creatinine Ratio 14 12 - 28   Sodium 137 134 - 144 mmol/L   Potassium 4.4 3.5 - 5.2 mmol/L   Chloride 98 96 - 106 mmol/L   CO2 23 20 - 29 mmol/L   Calcium 9.3 8.7 - 10.3 mg/dL   Total Protein 6.4 6.0 - 8.5 g/dL   Albumin 4.6 3.9 - 4.9 g/dL   Globulin, Total 1.8 1.5 - 4.5 g/dL   Albumin/Globulin Ratio 2.6 (H) 1.2 - 2.2   Bilirubin Total 0.5 0.0 - 1.2 mg/dL   Alkaline Phosphatase 93 44 - 121  IU/L   AST 14 0 - 40 IU/L   ALT 12 0 - 32 IU/L  Lipid Panel w/o Chol/HDL Ratio  Result Value Ref Range   Cholesterol, Total 211 (H) 100 - 199 mg/dL   Triglycerides 297 (H) 0 - 149 mg/dL   HDL 36 (L) >39 mg/dL   VLDL Cholesterol Cal 52 (H) 5 - 40 mg/dL   LDL Chol Calc (NIH) 123 (H) 0 - 99 mg/dL  Urinalysis, Routine w reflex microscopic  Result Value Ref Range   Specific Gravity, UA <1.005 (L) 1.005 - 1.030   pH, UA 5.5 5.0 - 7.5   Color, UA Yellow Yellow   Appearance Ur Clear Clear   Leukocytes,UA Negative Negative   Protein,UA Negative Negative/Trace   Glucose, UA Negative Negative   Ketones, UA Negative Negative   RBC, UA Negative Negative   Bilirubin, UA Negative Negative   Urobilinogen, Ur 0.2 0.2 - 1.0 mg/dL   Nitrite, UA Negative Negative  TSH  Result Value Ref Range   TSH 1.510 0.450 - 4.500 uIU/mL      Assessment & Plan:   Problem List Items Addressed This Visit       Other   Major depression in full remission ([place]) - Primary    Chronic, stable. PHQ9-3 today. Under good control, continue taking Citalopram 60 mg daily. Refills given.          Follow up plan: Return in about 6 months (around 06/06/2023) for physical.

## 2022-12-04 NOTE — Assessment & Plan Note (Signed)
Chronic, stable. PHQ9-3 today. Under good control, continue taking Citalopram 60 mg daily. Refills given.

## 2022-12-04 NOTE — Progress Notes (Unsigned)
BP 120/76   Pulse 60   Temp 97.9 F (36.6 C) (Oral)   Ht '5\' 8"'$  (1.727 m)   Wt 185 lb 9.6 oz (84.2 kg)   LMP  (LMP Unknown)   SpO2 98%   BMI 28.22 kg/m    Subjective:    Patient ID: Debra Cannon, female    DOB: Jun 02, 1956, 67 y.o.   MRN: [id number]  HPI: Debra Cannon is a 67 y.o. female  Chief Complaint  Patient presents with   Depression   DEPRESSION Mood status: {Blank single:19197::"controlled","uncontrolled","better","worse","exacerbated","stable"} Satisfied with current treatment?: {Blank single:19197::"yes","no"} Symptom severity: {Blank single:19197::"mild","moderate","severe"}  Duration of current treatment : {Blank single:19197::"chronic","months","years"} Side effects: {Blank single:19197::"yes","no"} Medication compliance: {Blank single:19197::"excellent compliance","good compliance","fair compliance","poor compliance"} Psychotherapy/counseling: {Blank single:19197::"yes","no"} {Blank single:19197::"current","in the past"} Previous psychiatric medications: {Blank multiple:19196::"abilify","amitryptiline","buspar","celexa","cymbalta","depakote","effexor","lamictal","lexapro","lithium","nortryptiline","paxil","prozac","pristiq (desvenlafaxine","seroquel","wellbutrin","zoloft","zyprexa"} Depressed mood: {Blank single:19197::"yes","no"} Anxious mood: {Blank single:19197::"yes","no"} Anhedonia: {Blank single:19197::"yes","no"} Significant weight loss or gain: {Blank single:19197::"yes","no"} Insomnia: {Blank single:19197::"yes","no"} {Blank single:19197::"hard to fall asleep","hard to stay asleep"} Fatigue: {Blank single:19197::"yes","no"} Feelings of worthlessness or guilt: {Blank single:19197::"yes","no"} Impaired concentration/indecisiveness: {Blank single:19197::"yes","no"} Suicidal ideations: {Blank single:19197::"yes","no"} Hopelessness: {Blank single:19197::"yes","no"} Crying spells: {Blank single:19197::"yes","no"}    12/04/2022    1:53 PM 06/05/2022    1:50  PM 12/02/2021    3:33 PM 06/03/2021    3:28 PM 05/30/2020    3:25 PM  Depression screen PHQ 2/9  Decreased Interest '1 1 1 '$ 0 0  Down, Depressed, Hopeless '1 1 1 '$ 0 0  PHQ - 2 Score '2 2 2 '$ 0 0  Altered sleeping 0 0 1 0 0  Tired, decreased energy 0 0 2 0 1  Change in appetite 0 0 0 0 0  Feeling bad or failure about yourself  1 0 0 0 0  Trouble concentrating 0 0 1 0 1  Moving slowly or fidgety/restless 0 0 0 0 0  Suicidal thoughts 0 0 0 0 0  PHQ-9 Score '3 2 6 '$ 0 2  Difficult doing work/chores Not difficult at all Somewhat difficult Somewhat difficult Not difficult at all      Relevant past medical, surgical, family and social history reviewed and updated as indicated. Interim medical history since our last visit reviewed. Allergies and medications reviewed and updated.  Review of Systems  Constitutional: Negative.   Respiratory: Negative.    Cardiovascular: Negative.   Musculoskeletal: Negative.   Psychiatric/Behavioral: Negative.      Per HPI unless specifically indicated above     Objective:    BP 120/76   Pulse 60   Temp 97.9 F (36.6 C) (Oral)   Ht '5\' 8"'$  (1.727 m)   Wt 185 lb 9.6 oz (84.2 kg)   LMP  (LMP Unknown)   SpO2 98%   BMI 28.22 kg/m   Wt Readings from Last 3 Encounters:  12/04/22 185 lb 9.6 oz (84.2 kg)  06/05/22 180 lb 6.4 oz (81.8 kg)  12/02/21 176 lb 6.4 oz (80 kg)    Physical Exam Vitals and nursing note reviewed.  Constitutional:      General: She is not in acute distress.    Appearance: Normal appearance. She is normal weight. She is not ill-appearing, toxic-appearing or diaphoretic.  HENT:     Head: Normocephalic and atraumatic.     Right Ear: External ear normal.     Left Ear: External ear normal.     Nose: Nose normal.     Mouth/Throat:     Mouth: Mucous membranes are moist.     Pharynx: Oropharynx is clear.  Eyes:     General: No scleral icterus.       Right eye: No discharge.        Left eye: No discharge.     Extraocular Movements:  Extraocular movements intact.     Conjunctiva/sclera: Conjunctivae normal.     Pupils: Pupils are equal, round, and reactive to light.  Cardiovascular:     Rate and Rhythm: Normal rate and regular rhythm.     Pulses: Normal pulses.     Heart sounds: Normal heart sounds. No murmur heard.    No friction rub. No gallop.  Pulmonary:     Effort: Pulmonary effort is normal. No respiratory distress.     Breath sounds: Normal breath sounds. No stridor. No wheezing, rhonchi or rales.  Chest:     Chest wall: No tenderness.  Musculoskeletal:        General: Normal range of motion.     Cervical back: Normal range of motion and neck supple.  Skin:    General: Skin is warm and dry.     Capillary Refill: Capillary refill takes less than 2 seconds.     Coloration: Skin is not jaundiced or pale.     Findings: No bruising, erythema, lesion or rash.  Neurological:     General: No focal deficit present.     Mental Status: She is alert and oriented to person, place, and time. Mental status is at baseline.  Psychiatric:        Mood and Affect: Mood normal.        Behavior: Behavior normal.        Thought Content: Thought content normal.        Judgment: Judgment normal.     Results for orders placed or performed in visit on 06/05/22  CBC with Differential/Platelet  Result Value Ref Range   WBC 7.9 3.4 - 10.8 x10E3/uL   RBC 4.60 3.77 - 5.28 x10E6/uL   Hemoglobin 14.5 11.1 - 15.9 g/dL   Hematocrit 41.6 34.0 - 46.6 %   MCV 90 79 - 97 fL   MCH 31.5 26.6 - 33.0 pg   MCHC 34.9 31.5 - 35.7 g/dL   RDW 11.8 11.7 - 15.4 %   Platelets 363 150 - 450 x10E3/uL   Neutrophils 54 Not Estab. %   Lymphs 32 Not Estab. %   Monocytes 10 Not Estab. %   Eos 3 Not Estab. %   Basos 1 Not Estab. %   Neutrophils Absolute 4.3 1.4 - 7.0 x10E3/uL   Lymphocytes Absolute 2.5 0.7 - 3.1 x10E3/uL   Monocytes Absolute 0.8 0.1 - 0.9 x10E3/uL   EOS (ABSOLUTE) 0.2 0.0 - 0.4 x10E3/uL   Basophils Absolute 0.0 0.0 - 0.2  x10E3/uL   Immature Granulocytes 0 Not Estab. %   Immature Grans (Abs) 0.0 0.0 - 0.1 x10E3/uL  Comprehensive metabolic panel  Result Value Ref Range   Glucose 80 70 - 99 mg/dL   BUN 11 8 - 27 mg/dL   Creatinine, Ser 0.81 0.57 - 1.00 mg/dL   eGFR 81 >59 mL/min/1.73   BUN/Creatinine Ratio 14 12 - 28   Sodium 137 134 - 144 mmol/L   Potassium 4.4 3.5 - 5.2 mmol/L   Chloride 98 96 - 106 mmol/L   CO2 23 20 - 29 mmol/L   Calcium 9.3 8.7 - 10.3 mg/dL   Total Protein 6.4 6.0 - 8.5 g/dL   Albumin 4.6 3.9 - 4.9 g/dL   Globulin, Total 1.8 1.5 - 4.5 g/dL  Albumin/Globulin Ratio 2.6 (H) 1.2 - 2.2   Bilirubin Total 0.5 0.0 - 1.2 mg/dL   Alkaline Phosphatase 93 44 - 121 IU/L   AST 14 0 - 40 IU/L   ALT 12 0 - 32 IU/L  Lipid Panel w/o Chol/HDL Ratio  Result Value Ref Range   Cholesterol, Total 211 (H) 100 - 199 mg/dL   Triglycerides 297 (H) 0 - 149 mg/dL   HDL 36 (L) >39 mg/dL   VLDL Cholesterol Cal 52 (H) 5 - 40 mg/dL   LDL Chol Calc (NIH) 123 (H) 0 - 99 mg/dL  Urinalysis, Routine w reflex microscopic  Result Value Ref Range   Specific Gravity, UA <1.005 (L) 1.005 - 1.030   pH, UA 5.5 5.0 - 7.5   Color, UA Yellow Yellow   Appearance Ur Clear Clear   Leukocytes,UA Negative Negative   Protein,UA Negative Negative/Trace   Glucose, UA Negative Negative   Ketones, UA Negative Negative   RBC, UA Negative Negative   Bilirubin, UA Negative Negative   Urobilinogen, Ur 0.2 0.2 - 1.0 mg/dL   Nitrite, UA Negative Negative  TSH  Result Value Ref Range   TSH 1.510 0.450 - 4.500 uIU/mL      Assessment & Plan:   Problem List Items Addressed This Visit       Other   Major depression in full remission ([place]) - Primary    Chronic, stable. PHQ9-3 today. Under good control, continue taking Citalopram 60 mg daily. Refills given.          Follow up plan: Return in about 6 months (around 06/06/2023) for physical.

## 2022-12-22 ENCOUNTER — Ambulatory Visit
Admission: RE | Admit: 2022-12-22 | Discharge: 2022-12-22 | Disposition: A | Discharge status: Home / Self Care | Payer: 59 | Source: Ambulatory Visit | Attending: Family Medicine | Admitting: Family Medicine

## 2022-12-22 DIAGNOSIS — Z1231 Encounter for screening mammogram for malignant neoplasm of breast: Secondary | ICD-10-CM

## 2022-12-24 ENCOUNTER — Encounter: Payer: Self-pay | Admitting: Family Medicine

## 2022-12-24 NOTE — Telephone Encounter (Signed)
Leave for Dr. Johnson review. °

## 2022-12-25 NOTE — Telephone Encounter (Signed)
Please print and I'll fill out when I return

## 2022-12-28 ENCOUNTER — Telehealth: Payer: Self-pay | Admitting: Family Medicine

## 2022-12-28 NOTE — Telephone Encounter (Signed)
Patient brought in a [place] form to be completed by the provider. Form has been placed in providers folder for completion. When complete please email back to the email address on the form.

## 2023-01-21 ENCOUNTER — Telehealth: Payer: 59 | Admitting: Physician Assistant

## 2023-01-21 ENCOUNTER — Other Ambulatory Visit: Payer: Self-pay

## 2023-01-21 DIAGNOSIS — B9689 Other specified bacterial agents as the cause of diseases classified elsewhere: Secondary | ICD-10-CM

## 2023-01-21 DIAGNOSIS — J208 Acute bronchitis due to other specified organisms: Secondary | ICD-10-CM

## 2023-01-21 DIAGNOSIS — A084 Viral intestinal infection, unspecified: Secondary | ICD-10-CM

## 2023-01-21 MED ORDER — ONDANSETRON 4 MG PO TBDP
4.0000 mg | ORAL_TABLET | Freq: Three times a day (TID) | ORAL | 0 refills | Status: AC | PRN
  Filled 2023-01-21: qty 18, 21d supply, fill #0

## 2023-01-21 MED ORDER — AZITHROMYCIN 250 MG PO TABS
ORAL_TABLET | ORAL | 0 refills | Status: AC
  Filled 2023-01-21: qty 6, 5d supply, fill #0

## 2023-01-21 MED ORDER — BENZONATATE 100 MG PO CAPS
100.0000 mg | ORAL_CAPSULE | Freq: Three times a day (TID) | ORAL | 0 refills | Status: DC | PRN
  Filled 2023-01-21: qty 30, 10d supply, fill #0

## 2023-01-21 NOTE — Patient Instructions (Signed)
Scarlett Presto, thank you for joining [name], PA-C for today's virtual visit.  While this provider is not your primary care provider (PCP), if your PCP is located in our provider database this encounter information will be shared with them immediately following your visit.   A [facility] MyChart account gives you access to today's visit and all your visits, tests, and labs performed at [facility] " click here if you don't have a [facility] MyChart account or go to [email or web address]  Consent: (Patient) Debra Cannon provided verbal consent for this virtual visit at the beginning of the encounter.  Current Medications:  Current Outpatient Medications:    azithromycin (ZITHROMAX) 250 MG tablet, Take 2 tablets on day 1, then 1 tablet daily on days 2 through 5, Disp: 6 tablet, Rfl: 0   benzonatate (TESSALON) 100 MG capsule, Take 1 capsule (100 mg total) by mouth 3 (three) times daily as needed., Disp: 30 capsule, Rfl: 0   ondansetron (ZOFRAN-ODT) 4 MG disintegrating tablet, Take 1 tablet (4 mg total) by mouth every 8 (eight) hours as needed., Disp: 20 tablet, Rfl: 0   acyclovir (ZOVIRAX) 400 MG tablet, TAKE 1 TABLET (400 MG TOTAL) BY MOUTH 3 (THREE) TIMES DAILY., Disp: 90 tablet, Rfl: 2   B Complex Vitamins (VITAMIN B COMPLEX) TABS, Take 1 tablet by mouth daily., Disp: , Rfl:    Cholecalciferol (VITAMIN D PO), Take 2,000 Units by mouth daily. , Disp: , Rfl:    citalopram (CELEXA) 40 MG tablet, Take 1.5 tablets (60 mg total) by mouth daily., Disp: 135 tablet, Rfl: 1   cyclobenzaprine (FLEXERIL) 10 MG tablet, Take 1 tablet (10 mg total) by mouth at bedtime., Disp: 30 tablet, Rfl: 0   Fexofenadine HCl (ALLEGRA PO), Take by mouth daily., Disp: , Rfl:    LORazepam (ATIVAN) 0.5 MG tablet, Take 1 tablet (0.5 mg total) by mouth 2 (two) times daily as needed for anxiety., Disp: 30 tablet, Rfl: 0   omeprazole (PRILOSEC) 20 MG capsule, Take 20 mg by mouth daily as needed. ,  Disp: , Rfl:    SUMAtriptan (IMITREX) 100 MG tablet, TAKE 1 TABLET BY MOUTH AS NEEDED. MAY REPEAT IN 2 HOURS IF HEADACHE PERSISTS OR RECURS., Disp: 9 tablet, Rfl: 12   zolmitriptan (ZOMIG) 5 MG tablet, TAKE 1 TABLET BY MOUTH DAILY AS NEEDED., Disp: 6 tablet, Rfl: 12  Current Facility-Administered Medications:    aspirin EC tablet 324 mg, 324 mg, Oral, Once, End, Christopher, MD   Medications ordered in this encounter:  Meds ordered this encounter  Medications   ondansetron (ZOFRAN-ODT) 4 MG disintegrating tablet    Sig: Take 1 tablet (4 mg total) by mouth every 8 (eight) hours as needed.    Dispense:  20 tablet    Refill:  0    Order Specific Question:   Supervising Provider    Answer:   Merrilee Jansky [[id number]]   azithromycin (ZITHROMAX) 250 MG tablet    Sig: Take 2 tablets on day 1, then 1 tablet daily on days 2 through 5    Dispense:  6 tablet    Refill:  0    Order Specific Question:   Supervising Provider    Answer:   Merrilee Jansky [[id number]]   benzonatate (TESSALON) 100 MG capsule    Sig: Take 1 capsule (100 mg total) by mouth 3 (three) times daily as needed.    Dispense:  30 capsule    Refill:  0  Order Specific Question:   Supervising Provider    Answer:   Merrilee Jansky [[id number]]     *If you need refills on other medications prior to your next appointment, please contact your pharmacy*  Follow-Up: Call back or seek an in-person evaluation if the symptoms worsen or if the condition fails to improve as anticipated.  [facility] [phone]  Other Instructions  Food Choices to Help Relieve Diarrhea, Adult Diarrhea can make you feel weak and cause you to become dehydrated. Dehydration is a condition in which there is not enough water or other fluids in the body. It is important to choose the right foods and drinks to: Relieve diarrhea. Replace lost fluids and nutrients. Prevent dehydration. What are tips for following this plan? Relieving  diarrhea Avoid foods that make your diarrhea worse. These may include: Foods and drinks that are sweetened with high-fructose corn syrup, honey, or sweeteners such as xylitol, sorbitol, and mannitol. Check food labels for these ingredients. Fried, greasy, or spicy foods. Raw fruits and vegetables. Eat foods that are rich in probiotics. These include foods such as yogurt and fermented milk products. Probiotics can help increase healthy bacteria in your stomach and intestines (gastrointestinal or GI tract). This may help digestion and stop diarrhea. If you have lactose intolerance, avoid dairy products. These may make your diarrhea worse. Take medicine to help stop diarrhea only as told by your health care provider. Replacing nutrients  Eat bland, easy-to-digest foods in small amounts as you are able, until your diarrhea starts to get better. These foods include bananas, applesauce, rice, toast, and crackers. Over time, add nutrient-rich foods as your body tolerates them or as told by your health care provider. These include: Well-cooked protein foods, such as eggs, lean meats like fish or chicken without skin, and tofu. Peeled, seeded, and soft-cooked fruits and vegetables. Low-fat dairy products. Whole grains. Take vitamin and mineral supplements as told by your health care provider. Preventing dehydration  Start by sipping water or a solution to prevent dehydration (oral rehydration solution, or ORS). This is a drink that helps replace fluids and minerals your body has lost. You can buy an ORS at pharmacies and retail stores. Try to drink at least 8-10 cups (2,000-2,500 mL) of fluid each day to help replace lost fluids. If your urine is pale yellow, you are getting enough fluids. You may drink other liquids in addition to water, such as fruit juice that you have added water to (diluted fruit juice) or low-calorie sports drinks, as tolerated or as told by your health care provider. Avoid drinks  with caffeine, such as coffee, tea, or soft drinks. Avoid alcohol. This information is not intended to replace advice given to you by your health care provider. Make sure you discuss any questions you have with your health care provider. Document Revised: 03/10/2022 Document Reviewed: 03/10/2022 Elsevier Patient Education  2023 Elsevier Inc.    If you have been instructed to have an in-person evaluation today at a local Urgent Care facility, please use the link below. It will take you to a list of all of our available [facility], including address, phone number and hours of operation. Please do not delay care.  [facility]  If you or a family member do not have a primary care provider, use the link below to schedule a visit and establish care. When you choose a  primary care physician or advanced practice provider, you gain a  long-term partner in health. Find a Primary Care Provider  Learn more about [facility] in-office and virtual care options: [facility]

## 2023-01-21 NOTE — Progress Notes (Signed)
Virtual Visit Consent   Debra Cannon, you are scheduled for a virtual visit with a [facility] provider today. Just as with appointments in the office, your consent must be obtained to participate. Your consent will be active for this visit and any virtual visit you may have with one of our providers in the next 365 days. If you have a MyChart account, a copy of this consent can be sent to you electronically.  As this is a virtual visit, video technology does not allow for your provider to perform a traditional examination. This may limit your provider's ability to fully assess your condition. If your provider identifies any concerns that need to be evaluated in person or the need to arrange testing (such as labs, EKG, etc.), we will make arrangements to do so. Although advances in technology are sophisticated, we cannot ensure that it will always work on either your end or our end. If the connection with a video visit is poor, the visit may have to be switched to a telephone visit. With either a video or telephone visit, we are not always able to ensure that we have a secure connection.  By engaging in this virtual visit, you consent to the provision of healthcare and authorize for your insurance to be billed (if applicable) for the services provided during this visit. Depending on your insurance coverage, you may receive a charge related to this service.  I need to obtain your verbal consent now. Are you willing to proceed with your visit today? Debra Cannon has provided verbal consent on 01/21/2023 for a virtual visit (video or telephone). [name], PA-C  Date: 01/21/2023 12:55 PM  Virtual Visit via Video Note   I, [name], connected with  Debra Cannon  ([id number], [id number]) on 01/21/23 at 12:45 PM EDT by a video-enabled telemedicine application and verified that I am speaking with the correct person using two identifiers.  Location: Patient: Virtual Visit Location  Patient: Home Provider: Virtual Visit Location Provider: Home Office   I discussed the limitations of evaluation and management by telemedicine and the availability of in person appointments. The patient expressed understanding and agreed to proceed.    History of Present Illness: Debra Cannon is a 67 y.o. who identifies as a female who was assigned female at birth, and is being seen today for cough and GI symptoms.  HPI: Cough This is a new problem. The current episode started in the past 7 days (Started almost a week ago mildly and then increased on Monday). The problem has been gradually worsening. The problem occurs constantly. The cough is Productive of purulent sputum and productive of sputum (was mucousy but has become dry today). Associated symptoms include chills, a fever (subjective), headaches, myalgias, nasal congestion, postnasal drip, rhinorrhea and a sore throat (dry). Pertinent negatives include no chest pain, ear congestion, ear pain, hemoptysis, shortness of breath, sweats or wheezing. The symptoms are aggravated by lying down. Treatments tried: claritin, humidifier, saline nasal rinses, tylenol. The treatment provided no relief.  GI Problem The primary symptoms include fever (subjective), fatigue, abdominal pain, nausea, vomiting, diarrhea and myalgias. The illness began 2 days ago (Tuesday, 01/19/23). The onset was sudden. The problem has been gradually improving.  The illness is also significant for chills and bloating. The illness does not include tenesmus.     Problems:  Patient Active Problem List   Diagnosis Date Noted   Major depression in full remission 01/03/2018   Insomnia  11/02/2016   Herpes simplex 08/30/2015   Hypercholesteremia 08/30/2015   Menopause 08/30/2015   Migraine 08/30/2015    Allergies: No Known Allergies Medications:  Current Outpatient Medications:    azithromycin (ZITHROMAX) 250 MG tablet, Take 2 tablets on day 1, then 1 tablet daily on days  2 through 5, Disp: 6 tablet, Rfl: 0   benzonatate (TESSALON) 100 MG capsule, Take 1 capsule (100 mg total) by mouth 3 (three) times daily as needed., Disp: 30 capsule, Rfl: 0   ondansetron (ZOFRAN-ODT) 4 MG disintegrating tablet, Take 1 tablet (4 mg total) by mouth every 8 (eight) hours as needed., Disp: 20 tablet, Rfl: 0   acyclovir (ZOVIRAX) 400 MG tablet, TAKE 1 TABLET (400 MG TOTAL) BY MOUTH 3 (THREE) TIMES DAILY., Disp: 90 tablet, Rfl: 2   B Complex Vitamins (VITAMIN B COMPLEX) TABS, Take 1 tablet by mouth daily., Disp: , Rfl:    Cholecalciferol (VITAMIN D PO), Take 2,000 Units by mouth daily. , Disp: , Rfl:    citalopram (CELEXA) 40 MG tablet, Take 1.5 tablets (60 mg total) by mouth daily., Disp: 135 tablet, Rfl: 1   cyclobenzaprine (FLEXERIL) 10 MG tablet, Take 1 tablet (10 mg total) by mouth at bedtime., Disp: 30 tablet, Rfl: 0   Fexofenadine HCl (ALLEGRA PO), Take by mouth daily., Disp: , Rfl:    LORazepam (ATIVAN) 0.5 MG tablet, Take 1 tablet (0.5 mg total) by mouth 2 (two) times daily as needed for anxiety., Disp: 30 tablet, Rfl: 0   omeprazole (PRILOSEC) 20 MG capsule, Take 20 mg by mouth daily as needed. , Disp: , Rfl:    SUMAtriptan (IMITREX) 100 MG tablet, TAKE 1 TABLET BY MOUTH AS NEEDED. MAY REPEAT IN 2 HOURS IF HEADACHE PERSISTS OR RECURS., Disp: 9 tablet, Rfl: 12   zolmitriptan (ZOMIG) 5 MG tablet, TAKE 1 TABLET BY MOUTH DAILY AS NEEDED., Disp: 6 tablet, Rfl: 12  Current Facility-Administered Medications:    aspirin EC tablet 324 mg, 324 mg, Oral, Once, End, Christopher, MD  Observations/Objective: Patient is well-developed, well-nourished in no acute distress.  Resting comfortably at home.  Head is normocephalic, atraumatic.  No labored breathing.  Speech is clear and coherent with logical content.  Patient is alert and oriented at baseline.    Assessment and Plan: 1. Acute bacterial bronchitis - azithromycin (ZITHROMAX) 250 MG tablet; Take 2 tablets on day 1, then 1  tablet daily on days 2 through 5  Dispense: 6 tablet; Refill: 0 - benzonatate (TESSALON) 100 MG capsule; Take 1 capsule (100 mg total) by mouth 3 (three) times daily as needed.  Dispense: 30 capsule; Refill: 0  2. Viral gastroenteritis - ondansetron (ZOFRAN-ODT) 4 MG disintegrating tablet; Take 1 tablet (4 mg total) by mouth every 8 (eight) hours as needed.  Dispense: 20 tablet; Refill: 0  - Worsening over a week despite OTC medications - Will treat with Z-pack and tessalon perles - Can continue Mucinex  - Push fluids.  - Rest.  - Steam and humidifier can help  - Suspect viral gastroenteritis - Zofran for nausea - Push fluids, electrolyte beverages - Liquid diet, then increase to soft/bland (BRAT) diet over next day, then increase diet as tolerated  - Seek in person evaluation if worsening or symptoms fail to improve    Follow Up Instructions: I discussed the assessment and treatment plan with the patient. The patient was provided an opportunity to ask questions and all were answered. The patient agreed with the plan and demonstrated an understanding of  the instructions.  A copy of instructions were sent to the patient via MyChart unless otherwise noted below.    The patient was advised to call back or seek an in-person evaluation if the symptoms worsen or if the condition fails to improve as anticipated.  Time:  I spent 10 minutes with the patient via telehealth technology discussing the above problems/concerns.    [name], PA-C

## 2023-02-04 ENCOUNTER — Other Ambulatory Visit: Payer: Self-pay

## 2023-03-08 ENCOUNTER — Other Ambulatory Visit: Payer: Self-pay

## 2023-04-06 DIAGNOSIS — L821 Other seborrheic keratosis: Secondary | ICD-10-CM | POA: Diagnosis not present

## 2023-04-06 DIAGNOSIS — D485 Neoplasm of uncertain behavior of skin: Secondary | ICD-10-CM | POA: Diagnosis not present

## 2023-04-06 DIAGNOSIS — D225 Melanocytic nevi of trunk: Secondary | ICD-10-CM | POA: Diagnosis not present

## 2023-04-06 DIAGNOSIS — D2262 Melanocytic nevi of left upper limb, including shoulder: Secondary | ICD-10-CM | POA: Diagnosis not present

## 2023-04-06 DIAGNOSIS — D2261 Melanocytic nevi of right upper limb, including shoulder: Secondary | ICD-10-CM | POA: Diagnosis not present

## 2023-04-06 DIAGNOSIS — D2272 Melanocytic nevi of left lower limb, including hip: Secondary | ICD-10-CM | POA: Diagnosis not present

## 2023-04-06 DIAGNOSIS — D2271 Melanocytic nevi of right lower limb, including hip: Secondary | ICD-10-CM | POA: Diagnosis not present

## 2023-04-27 ENCOUNTER — Other Ambulatory Visit: Payer: Self-pay

## 2023-05-06 ENCOUNTER — Encounter: Payer: Self-pay | Admitting: Family Medicine

## 2023-05-17 DIAGNOSIS — H43813 Vitreous degeneration, bilateral: Secondary | ICD-10-CM | POA: Diagnosis not present

## 2023-05-17 DIAGNOSIS — H2513 Age-related nuclear cataract, bilateral: Secondary | ICD-10-CM | POA: Diagnosis not present

## 2023-06-08 ENCOUNTER — Encounter: Payer: 59 | Admitting: Family Medicine

## 2023-06-10 ENCOUNTER — Other Ambulatory Visit: Payer: Self-pay

## 2023-06-10 MED ORDER — FLUTICASONE PROPIONATE 50 MCG/ACT NA SUSP
2.0000 | Freq: Every day | NASAL | 12 refills | Status: AC
  Filled 2023-06-10: qty 16, 30d supply, fill #0

## 2023-06-14 ENCOUNTER — Other Ambulatory Visit: Payer: Self-pay

## 2023-06-22 ENCOUNTER — Other Ambulatory Visit (HOSPITAL_COMMUNITY)
Admission: RE | Admit: 2023-06-22 | Discharge: 2023-06-22 | Disposition: A | Discharge status: Home / Self Care | Payer: Medicare HMO | Source: Ambulatory Visit | Attending: Family Medicine | Admitting: Family Medicine

## 2023-06-22 ENCOUNTER — Other Ambulatory Visit: Payer: Self-pay

## 2023-06-22 ENCOUNTER — Encounter: Payer: Self-pay | Admitting: Family Medicine

## 2023-06-22 ENCOUNTER — Ambulatory Visit: Payer: Medicare HMO | Admitting: Family Medicine

## 2023-06-22 VITALS — BP 146/80 | HR 58 | Temp 97.9°F | Ht 67.0 in | Wt 183.2 lb

## 2023-06-22 DIAGNOSIS — Z01419 Encounter for gynecological examination (general) (routine) without abnormal findings: Secondary | ICD-10-CM | POA: Insufficient documentation

## 2023-06-22 DIAGNOSIS — F325 Major depressive disorder, single episode, in full remission: Secondary | ICD-10-CM

## 2023-06-22 DIAGNOSIS — Z23 Encounter for immunization: Secondary | ICD-10-CM | POA: Diagnosis not present

## 2023-06-22 DIAGNOSIS — Z136 Encounter for screening for cardiovascular disorders: Secondary | ICD-10-CM

## 2023-06-22 DIAGNOSIS — Z0000 Encounter for general adult medical examination without abnormal findings: Secondary | ICD-10-CM

## 2023-06-22 DIAGNOSIS — Z7189 Other specified counseling: Secondary | ICD-10-CM

## 2023-06-22 DIAGNOSIS — R030 Elevated blood-pressure reading, without diagnosis of hypertension: Secondary | ICD-10-CM

## 2023-06-22 DIAGNOSIS — Z1231 Encounter for screening mammogram for malignant neoplasm of breast: Secondary | ICD-10-CM

## 2023-06-22 DIAGNOSIS — Z124 Encounter for screening for malignant neoplasm of cervix: Secondary | ICD-10-CM

## 2023-06-22 DIAGNOSIS — E7800 Pure hypercholesterolemia, unspecified: Secondary | ICD-10-CM

## 2023-06-22 DIAGNOSIS — Z1151 Encounter for screening for human papillomavirus (HPV): Secondary | ICD-10-CM | POA: Insufficient documentation

## 2023-06-22 DIAGNOSIS — Z13820 Encounter for screening for osteoporosis: Secondary | ICD-10-CM

## 2023-06-22 LAB — URINALYSIS, ROUTINE W REFLEX MICROSCOPIC
Bilirubin, UA: NEGATIVE
Glucose, UA: NEGATIVE
Ketones, UA: NEGATIVE
Leukocytes,UA: NEGATIVE
Nitrite, UA: NEGATIVE
Protein,UA: NEGATIVE
Specific Gravity, UA: 1.015 (ref 1.005–1.030)
Urobilinogen, Ur: 0.2 mg/dL (ref 0.2–1.0)
pH, UA: 6.5 (ref 5.0–7.5)

## 2023-06-22 LAB — MICROSCOPIC EXAMINATION: Bacteria, UA: NONE SEEN

## 2023-06-22 LAB — MICROALBUMIN, URINE WAIVED
Creatinine, Urine Waived: 50 mg/dL (ref 10–300)
Microalb, Ur Waived: 10 mg/L (ref 0–19)
Microalb/Creat Ratio: <30 mg/g (ref <30)

## 2023-06-22 MED ORDER — LORAZEPAM 0.5 MG PO TABS
0.5000 mg | ORAL_TABLET | Freq: Two times a day (BID) | ORAL | 0 refills | Status: DC | PRN
  Filled 2023-06-22: qty 30, 15d supply, fill #0

## 2023-06-22 MED ORDER — ZOLMITRIPTAN 5 MG PO TABS
ORAL_TABLET | Freq: Every day | ORAL | 12 refills | Status: DC | PRN
Start: 2023-06-22 — End: 2023-11-02
  Filled 2023-06-22: qty 6, 15d supply, fill #0
  Filled 2023-07-18: qty 6, 15d supply, fill #1

## 2023-06-22 MED ORDER — CITALOPRAM HYDROBROMIDE 40 MG PO TABS
60.0000 mg | ORAL_TABLET | Freq: Every day | ORAL | 1 refills | Status: DC
  Filled 2023-06-22: qty 45, 30d supply, fill #0
  Filled 2023-07-18: qty 45, 30d supply, fill #1
  Filled 2023-08-31: qty 45, 30d supply, fill #2
  Filled 2023-11-02: qty 45, 30d supply, fill #3

## 2023-06-22 MED ORDER — SUMATRIPTAN SUCCINATE 100 MG PO TABS
100.0000 mg | ORAL_TABLET | ORAL | 12 refills | Status: DC | PRN
  Filled 2023-06-22: qty 9, 30d supply, fill #0
  Filled 2023-07-18: qty 9, 30d supply, fill #1
  Filled 2023-08-31: qty 9, 30d supply, fill #2

## 2023-06-22 NOTE — Progress Notes (Signed)
BP (!) 146/80   Pulse (!) 58   Temp 97.9 F (36.6 C) (Oral)   Ht 5\' 7"  (1.702 m)   Wt 183 lb 3.2 oz (83.1 kg)   LMP  (LMP Unknown)   SpO2 100%   BMI 28.69 kg/m    Subjective:    Patient ID: Debra Cannon, female    DOB: 1956-05-25, 67 y.o.   MRN: [id number]  HPI: Debra Cannon is a 67 y.o. female presenting on 06/22/2023 for comprehensive medical examination. Current medical complaints include:  ANXIETY/DEPRESSION Duration: chronic Status:controlled Anxious mood: yes  Excessive worrying: no Irritability: no  Sweating: no Nausea: no Palpitations:no Hyperventilation: no Panic attacks: no Agoraphobia: no  Obscessions/compulsions: no Depressed mood: no    06/22/2023    1:36 PM 12/04/2022    1:53 PM 06/05/2022    1:50 PM 12/02/2021    3:33 PM 06/03/2021    3:28 PM  Depression screen PHQ 2/9  Decreased Interest 1 1 1 1  0  Down, Depressed, Hopeless 1 1 1 1  0  PHQ - 2 Score 2 2 2 2  0  Altered sleeping 1 0 0 1 0  Tired, decreased energy 1 0 0 2 0  Change in appetite 0 0 0 0 0  Feeling bad or failure about yourself  1 1 0 0 0  Trouble concentrating 0 0 0 1 0  Moving slowly or fidgety/restless 0 0 0 0 0  Suicidal thoughts 0 0 0 0 0  PHQ-9 Score 5 3 2 6  0  Difficult doing work/chores Somewhat difficult Not difficult at all Somewhat difficult Somewhat difficult Not difficult at all   Anhedonia: no Weight changes: no Insomnia: no   Hypersomnia: no Fatigue/loss of energy: no Feelings of worthlessness: no Feelings of guilt: no Impaired concentration/indecisiveness: no Suicidal ideations: no  Crying spells: no Recent Stressors/Life Changes: no   Relationship problems: no   Family stress: no     Financial stress: no    Job stress: no    Recent death/loss: no  HYPERLIPIDEMIA Hyperlipidemia status: excellent compliance Satisfied with current treatment?  yes Side effects:  N/A Past cholesterol meds: none Supplements: none Aspirin:  no The 10-year ASCVD risk score  (Arnett DK, et al., 2019) is: 9.5%   Values used to calculate the score:     Age: 77 years     Sex: Female     Is Non-Hispanic African American: No     Diabetic: No     Tobacco smoker: No     Systolic Blood Pressure: 146 mmHg     Is BP treated: No     HDL Cholesterol: 39 mg/dL     Total Cholesterol: 261 mg/dL Chest pain:  no Coronary artery disease:  no  She currently lives with: husband Menopausal Symptoms: no  Functional Status Survey: Is the patient deaf or have difficulty hearing?: Yes Does the patient have difficulty seeing, even when wearing glasses/contacts?: No Does the patient have difficulty concentrating, remembering, or making decisions?: No Does the patient have difficulty walking or climbing stairs?: No Does the patient have difficulty dressing or bathing?: No Does the patient have difficulty doing errands alone such as visiting a doctor's office or shopping?: No     06/22/2023    1:36 PM 06/03/2021    3:28 PM 05/30/2020    3:25 PM 05/25/2019    3:53 PM 01/03/2018    3:20 PM  Fall Risk   Falls in the past year? 0 0 0  1 No  Number falls in past yr: 0 0 0 0   Injury with Fall? 0 0 0 0   Risk for fall due to : No Fall Risks No Fall Risks     Follow up Falls evaluation completed Falls evaluation completed       Depression Screen    06/22/2023    1:36 PM 12/04/2022    1:53 PM 06/05/2022    1:50 PM 12/02/2021    3:33 PM 06/03/2021    3:28 PM  Depression screen PHQ 2/9  Decreased Interest 1 1 1 1  0  Down, Depressed, Hopeless 1 1 1 1  0  PHQ - 2 Score 2 2 2 2  0  Altered sleeping 1 0 0 1 0  Tired, decreased energy 1 0 0 2 0  Change in appetite 0 0 0 0 0  Feeling bad or failure about yourself  1 1 0 0 0  Trouble concentrating 0 0 0 1 0  Moving slowly or fidgety/restless 0 0 0 0 0  Suicidal thoughts 0 0 0 0 0  PHQ-9 Score 5 3 2 6  0  Difficult doing work/chores Somewhat difficult Not difficult at all Somewhat difficult Somewhat difficult Not difficult at all    Advanced Directives Does patient have a HCPOA?    no If yes, name and contact information:  Does patient have a living will or MOST form?  no  Past Medical History:  Past Medical History:  Diagnosis Date   Herpes    Hypercholesteremia    Menopause    Migraine     Surgical History:  Past Surgical History:  Procedure Laterality Date   APPENDECTOMY     BREAST EXCISIONAL BIOPSY Right 2019   lipoma   COLONOSCOPY  09/03/2008   Dr Mechele Collin   COLONOSCOPY WITH PROPOFOL N/A 06/23/2019   Procedure: COLONOSCOPY WITH PROPOFOL;  Surgeon: Wyline Mood, MD;  Location: [facility] ENDOSCOPY;  Service: Gastroenterology;  Laterality: N/A;   laporoscopy     REPAIR ANKLE LIGAMENT Left 2004 & 2005   UPPER GI ENDOSCOPY  2009   Dr Mechele Collin    Medications:  Current Outpatient Medications on File Prior to Visit  Medication Sig   acyclovir (ZOVIRAX) 400 MG tablet TAKE 1 TABLET (400 MG TOTAL) BY MOUTH 3 (THREE) TIMES DAILY.   B Complex Vitamins (VITAMIN B COMPLEX) TABS Take 1 tablet by mouth daily.   benzonatate (TESSALON) 100 MG capsule Take 1 capsule (100 mg total) by mouth 3 (three) times daily as needed.   Cholecalciferol (VITAMIN D PO) Take 2,000 Units by mouth daily.    cyclobenzaprine (FLEXERIL) 10 MG tablet Take 1 tablet (10 mg total) by mouth at bedtime.   Fexofenadine HCl (ALLEGRA PO) Take by mouth daily.   fluticasone (FLONASE) 50 MCG/ACT nasal spray Spray 2 sprays into each nostril once daily.   omeprazole (PRILOSEC) 20 MG capsule Take 20 mg by mouth daily as needed.    ondansetron (ZOFRAN-ODT) 4 MG disintegrating tablet Take 1 tablet (4 mg total) by mouth every 8 (eight) hours as needed.   Current Facility-Administered Medications on File Prior to Visit  Medication   aspirin EC tablet 324 mg    Allergies:  No Known Allergies  Social History:  Social History   Socioeconomic History   Marital status: Married    Spouse name: Not on file   Number of children: Not on file   Years of  education: Not on file   Highest education level: Not on file  Occupational History   Not on file  Tobacco Use   Smoking status: Former    Current packs/day: 0.00    Average packs/day: 1 pack/day for 20.0 years (20.0 ttl pk-yrs)    Types: Cigarettes    Start date: 31    Quit date: 2000    Years since quitting: 24.7   Smokeless tobacco: Never  Vaping Use   Vaping status: Never Used  Substance and Sexual Activity   Alcohol use: Not Currently    Alcohol/week: 0.0 standard drinks of alcohol    Comment: 2 drinks per year   Drug use: No   Sexual activity: Yes  Other Topics Concern   Not on file  Social History Narrative   Not on file   Social Determinants of Health   Financial Resource Strain: Not on file  Food Insecurity: Not on file  Transportation Needs: Not on file  Physical Activity: Not on file  Stress: Not on file  Social Connections: Not on file  Intimate Partner Violence: Not on file   Social History   Tobacco Use  Smoking Status Former   Current packs/day: 0.00   Average packs/day: 1 pack/day for 20.0 years (20.0 ttl pk-yrs)   Types: Cigarettes   Start date: 81   Quit date: 2000   Years since quitting: 24.7  Smokeless Tobacco Never   Social History   Substance and Sexual Activity  Alcohol Use Not Currently   Alcohol/week: 0.0 standard drinks of alcohol   Comment: 2 drinks per year    Family History:  Family History  Problem Relation Age of Onset   Osteoporosis Mother    Thyroid disease Mother    Heart disease Father    Heart attack Father 85   Cancer Sister        endometrial   Cardiomyopathy Brother    Cancer Maternal Grandmother        vulvar   Diabetes Maternal Grandmother    AAA (abdominal aortic aneurysm) Maternal Grandfather    Stroke Paternal Grandmother    Heart disease Paternal Grandfather    Breast cancer Neg Hx     Past medical history, surgical history, medications, allergies, family history and social history reviewed  with patient today and changes made to appropriate areas of the chart.   Review of Systems  Constitutional: Negative.   HENT:  Positive for hearing loss. Negative for congestion, ear discharge, ear pain, nosebleeds, sinus pain, sore throat and tinnitus.   Eyes: Negative.   Respiratory: Negative.  Negative for stridor.   Cardiovascular: Negative.   Gastrointestinal: Negative.   Genitourinary: Negative.   Musculoskeletal: Negative.   Skin: Negative.   Endo/Heme/Allergies:  Positive for environmental allergies. Negative for polydipsia. Does not bruise/bleed easily.    All other ROS negative except what is listed above and in the HPI.      Objective:    BP (!) 146/80   Pulse (!) 58   Temp 97.9 F (36.6 C) (Oral)   Ht 5\' 7"  (1.702 m)   Wt 183 lb 3.2 oz (83.1 kg)   LMP  (LMP Unknown)   SpO2 100%   BMI 28.69 kg/m   Wt Readings from Last 3 Encounters:  06/22/23 183 lb 3.2 oz (83.1 kg)  12/04/22 185 lb 9.6 oz (84.2 kg)  06/05/22 180 lb 6.4 oz (81.8 kg)    Hearing Screening   500Hz  1000Hz  2000Hz  4000Hz   Right ear 40 40 40 40  Left ear 0 0 0 0  Vision Screening   Right eye Left eye Both eyes  Without correction     With correction 20/35 20/35 20/35    Physical Exam Vitals and nursing note reviewed.  Constitutional:      General: She is not in acute distress.    Appearance: Normal appearance. She is not ill-appearing, toxic-appearing or diaphoretic.  HENT:     Head: Normocephalic and atraumatic.     Right Ear: Tympanic membrane, ear canal and external ear normal. There is no impacted cerumen.     Left Ear: Tympanic membrane, ear canal and external ear normal. There is no impacted cerumen.     Nose: Nose normal. No congestion or rhinorrhea.     Mouth/Throat:     Mouth: Mucous membranes are moist.     Pharynx: Oropharynx is clear. No oropharyngeal exudate or posterior oropharyngeal erythema.  Eyes:     General: No scleral icterus.       Right eye: No discharge.         Left eye: No discharge.     Extraocular Movements: Extraocular movements intact.     Conjunctiva/sclera: Conjunctivae normal.     Pupils: Pupils are equal, round, and reactive to light.  Neck:     Vascular: No carotid bruit.  Cardiovascular:     Rate and Rhythm: Normal rate and regular rhythm.     Pulses: Normal pulses.     Heart sounds: No murmur heard.    No friction rub. No gallop.  Pulmonary:     Effort: Pulmonary effort is normal. No respiratory distress.     Breath sounds: Normal breath sounds. No stridor. No wheezing, rhonchi or rales.  Chest:     Chest wall: No tenderness.  Abdominal:     General: Abdomen is flat. Bowel sounds are normal. There is no distension.     Palpations: Abdomen is soft. There is no mass.     Tenderness: There is no abdominal tenderness. There is no right CVA tenderness, left CVA tenderness, guarding or rebound.     Hernia: No hernia is present.  Genitourinary:    Comments: Breast and pelvic exams deferred with shared decision making Musculoskeletal:        General: No swelling, tenderness, deformity or signs of injury.     Cervical back: Normal range of motion and neck supple. No rigidity. No muscular tenderness.     Right lower leg: No edema.     Left lower leg: No edema.  Lymphadenopathy:     Cervical: No cervical adenopathy.  Skin:    General: Skin is warm and dry.     Capillary Refill: Capillary refill takes less than 2 seconds.     Coloration: Skin is not jaundiced or pale.     Findings: No bruising, erythema, lesion or rash.  Neurological:     General: No focal deficit present.     Mental Status: She is alert and oriented to person, place, and time. Mental status is at baseline.     Cranial Nerves: No cranial nerve deficit.     Sensory: No sensory deficit.     Motor: No weakness.     Coordination: Coordination normal.     Gait: Gait normal.     Deep Tendon Reflexes: Reflexes normal.  Psychiatric:        Mood and Affect: Mood normal.         Behavior: Behavior normal.        Thought Content: Thought content normal.  Judgment: Judgment normal.        06/22/2023    1:29 PM  6CIT Screen  What Year? 0 points  What month? 0 points  What time? 0 points  Count back from 20 0 points  Months in reverse 0 points  Repeat phrase 0 points  Total Score 0 points   Results for orders placed or performed in visit on 06/22/23  Microscopic Examination   Urine  Result Value Ref Range   WBC, UA 0-5 0 - 5 /hpf   RBC, Urine 0-2 0 - 2 /hpf   Epithelial Cells (non renal) 0-10 0 - 10 /hpf   Bacteria, UA None seen None seen/Few  CBC with Differential/Platelet  Result Value Ref Range   WBC 9.1 3.4 - 10.8 x10E3/uL   RBC 4.97 3.77 - 5.28 x10E6/uL   Hemoglobin 15.1 11.1 - 15.9 g/dL   Hematocrit 43.3 29.5 - 46.6 %   MCV 91 79 - 97 fL   MCH 30.4 26.6 - 33.0 pg   MCHC 33.3 31.5 - 35.7 g/dL   RDW 18.8 (L) 41.6 - 60.6 %   Platelets 386 150 - 450 x10E3/uL   Neutrophils 56 Not Estab. %   Lymphs 31 Not Estab. %   Monocytes 9 Not Estab. %   Eos 3 Not Estab. %   Basos 1 Not Estab. %   Neutrophils Absolute 5.1 1.4 - 7.0 x10E3/uL   Lymphocytes Absolute 2.8 0.7 - 3.1 x10E3/uL   Monocytes Absolute 0.8 0.1 - 0.9 x10E3/uL   EOS (ABSOLUTE) 0.3 0.0 - 0.4 x10E3/uL   Basophils Absolute 0.1 0.0 - 0.2 x10E3/uL   Immature Granulocytes 0 Not Estab. %   Immature Grans (Abs) 0.0 0.0 - 0.1 x10E3/uL  Comprehensive metabolic panel  Result Value Ref Range   Glucose 85 70 - 99 mg/dL   BUN 10 8 - 27 mg/dL   Creatinine, Ser 3.01 0.57 - 1.00 mg/dL   eGFR 73 >60 FU/XNA/3.55   BUN/Creatinine Ratio 11 (L) 12 - 28   Sodium 136 134 - 144 mmol/L   Potassium 4.7 3.5 - 5.2 mmol/L   Chloride 98 96 - 106 mmol/L   CO2 25 20 - 29 mmol/L   Calcium 9.9 8.7 - 10.3 mg/dL   Total Protein 6.8 6.0 - 8.5 g/dL   Albumin 4.7 3.9 - 4.9 g/dL   Globulin, Total 2.1 1.5 - 4.5 g/dL   Bilirubin Total 0.7 0.0 - 1.2 mg/dL   Alkaline Phosphatase 99 44 - 121 IU/L   AST 17  0 - 40 IU/L   ALT 14 0 - 32 IU/L  Lipid Panel w/o Chol/HDL Ratio  Result Value Ref Range   Cholesterol, Total 261 (H) 100 - 199 mg/dL   Triglycerides 732 (H) 0 - 149 mg/dL   HDL 39 (L) >20 mg/dL   VLDL Cholesterol Cal 54 (H) 5 - 40 mg/dL   LDL Chol Calc (NIH) 254 (H) 0 - 99 mg/dL  Urinalysis, Routine w reflex microscopic  Result Value Ref Range   Specific Gravity, UA 1.015 1.005 - 1.030   pH, UA 6.5 5.0 - 7.5   Color, UA Yellow Yellow   Appearance Ur Clear Clear   Leukocytes,UA Negative Negative   Protein,UA Negative Negative/Trace   Glucose, UA Negative Negative   Ketones, UA Negative Negative   RBC, UA Trace (A) Negative   Bilirubin, UA Negative Negative   Urobilinogen, Ur 0.2 0.2 - 1.0 mg/dL   Nitrite, UA Negative Negative   Microscopic  Examination See below:   TSH  Result Value Ref Range   TSH 1.580 0.450 - 4.500 uIU/mL  Microalbumin, Urine Waived  Result Value Ref Range   Microalb, Ur Waived 10 0 - 19 mg/L   Creatinine, Urine Waived 50 10 - 300 mg/dL   Microalb/Creat Ratio <30 <30 mg/g      Assessment & Plan:   Problem List Items Addressed This Visit       Other   Hypercholesteremia    Checking labs today. Await results. Treat as needed.       Relevant Orders   CBC with Differential/Platelet (Completed)   Comprehensive metabolic panel (Completed)   Lipid Panel w/o Chol/HDL Ratio (Completed)   TSH (Completed)   Major depression in full remission (HCC)    Under good control on current regimen. Continue current regimen. Continue to monitor. Call with any concerns. Refills given.        Relevant Medications   citalopram (CELEXA) 40 MG tablet   LORazepam (ATIVAN) 0.5 MG tablet   Advance directive discussed with patient    A voluntary discussion about advance care planning including the explanation and discussion of advance directives was extensively discussed  with the patient for 3 minutes with patient present.  Explanation about the health care proxy and  Living will was reviewed and packet with forms with explanation of how to fill them out was given.  During this discussion, the patient was not able to identify a health care proxy and plans to fill out the paperwork required.  Patient was offered a separate Advance Care Planning visit for further assistance with forms.         Other Visit Diagnoses     Welcome to Medicare preventive visit    -  Primary   Preventative care discussed today as below.   Elevated blood pressure reading       Will monitor at home- call if consistently running in the 140s.   Relevant Orders   Urinalysis, Routine w reflex microscopic (Completed)   Microalbumin, Urine Waived (Completed)   Screening for cervical cancer       Pap done today.   Relevant Orders   Cytology - PAP   Need for vaccination for pneumococcus       Prevnar given today.   Relevant Orders   Pneumococcal conjugate vaccine 20-valent (Prevnar 20) (Completed)   Screening for cardiovascular condition       EKG normal. Labs drawn today.   Relevant Orders   EKG 12-Lead (Completed)   Needs flu shot       Flu shot given today.   Relevant Orders   Flu Vaccine Trivalent High Dose (Fluad) (Completed)   Encounter for screening mammogram for malignant neoplasm of breast       Mammo ordered today.   Relevant Orders   MM 3D SCREENING MAMMOGRAM BILATERAL BREAST   Screening for osteoporosis       DEXA ordered today.   Relevant Orders   DG Bone Density        Preventative Services:  AAA screening: N/A Health Risk Assessment and Personalized Prevention Plan: done today Bone Mass Measurements: ordered today Breast Cancer Screening: ordered today CVD Screening: done today Cervical Cancer Screening:done today Colon Cancer Screening: up to date Depression Screening: done today Diabetes Screening: done today Glaucoma Screening: see your eye doctor Hepatitis B vaccine: N/A Hepatitis C screening: up to date HIV Screening: up to date Flu  Vaccine: given today Lung cancer Screening: N/A  Obesity Screening: done today Pneumonia Vaccines: done today STI Screening: N/A  Follow up plan: Return in about 6 months (around 12/20/2023).   LABORATORY TESTING:  - Pap smear: pap done  IMMUNIZATIONS:   - Tdap: Tetanus vaccination status reviewed: last tetanus booster within 10 years. - Influenza: Administered today - Prevnar: Administered today - Zostavax vaccine: Up to date  SCREENING: -Mammogram: Ordered today  - Colonoscopy: Up to date  - Bone Density: Ordered today   PATIENT COUNSELING:   Advised to take 1 mg of folate supplement per day if capable of pregnancy.   Sexuality: Discussed sexually transmitted diseases, partner selection, use of condoms, avoidance of unintended pregnancy  and contraceptive alternatives.   Advised to avoid cigarette smoking.  I discussed with the patient that most people either abstain from alcohol or drink within safe limits (<=14/week and <=4 drinks/occasion for males, <=7/weeks and <= 3 drinks/occasion for females) and that the risk for alcohol disorders and other health effects rises proportionally with the number of drinks per week and how often a drinker exceeds daily limits.  Discussed cessation/primary prevention of drug use and availability of treatment for abuse.   Diet: Encouraged to adjust caloric intake to maintain  or achieve ideal body weight, to reduce intake of dietary saturated fat and total fat, to limit sodium intake by avoiding high sodium foods and not adding table salt, and to maintain adequate dietary potassium and calcium preferably from fresh fruits, vegetables, and low-fat dairy products.    stressed the importance of regular exercise  Injury prevention: Discussed safety belts, safety helmets, smoke detector, smoking near bedding or upholstery.   Dental health: Discussed importance of regular tooth brushing, flossing, and dental visits.    NEXT PREVENTATIVE  PHYSICAL DUE IN 1 YEAR. Return in about 6 months (around 12/20/2023).

## 2023-06-22 NOTE — Progress Notes (Signed)
Sinus bradycardia at 58bpm, no ST segment changes

## 2023-06-22 NOTE — Patient Instructions (Signed)
Preventative Services:  AAA screening: N/A Health Risk Assessment and Personalized Prevention Plan: done today Bone Mass Measurements: ordered today Breast Cancer Screening: ordered today CVD Screening: done today Cervical Cancer Screening:done today Colon Cancer Screening: up to date Depression Screening: done today Diabetes Screening: done today Glaucoma Screening: see your eye doctor Hepatitis B vaccine: N/A Hepatitis C screening: up to date HIV Screening: up to date Flu Vaccine: given today Lung cancer Screening: N/A Obesity Screening: done today Pneumonia Vaccines: done today STI Screening: N/A

## 2023-06-26 ENCOUNTER — Encounter: Payer: Self-pay | Admitting: Family Medicine

## 2023-06-26 DIAGNOSIS — Z7189 Other specified counseling: Secondary | ICD-10-CM | POA: Insufficient documentation

## 2023-06-26 NOTE — Assessment & Plan Note (Signed)
A voluntary discussion about advance care planning including the explanation and discussion of advance directives was extensively discussed  with the patient for 3 minutes with patient present.  Explanation about the health care proxy and Living will was reviewed and packet with forms with explanation of how to fill them out was given.  During this discussion, the patient was not able to identify a health care proxy and plans to fill out the paperwork required.  Patient was offered a separate Advance Care Planning visit for further assistance with forms.

## 2023-06-26 NOTE — Assessment & Plan Note (Signed)
Checking labs today. Await results. Treat as needed.  

## 2023-06-26 NOTE — Assessment & Plan Note (Signed)
Under good control on current regimen. Continue current regimen. Continue to monitor. Call with any concerns. Refills given.   

## 2023-06-28 LAB — CYTOLOGY - PAP
Comment: NEGATIVE
Diagnosis: NEGATIVE
High risk HPV: NEGATIVE

## 2023-06-30 ENCOUNTER — Other Ambulatory Visit: Payer: Self-pay | Admitting: Otolaryngology

## 2023-06-30 DIAGNOSIS — H9312 Tinnitus, left ear: Secondary | ICD-10-CM

## 2023-07-05 ENCOUNTER — Ambulatory Visit: Payer: Medicare HMO

## 2023-07-06 ENCOUNTER — Ambulatory Visit (INDEPENDENT_AMBULATORY_CARE_PROVIDER_SITE_OTHER): Payer: Medicare HMO

## 2023-07-06 DIAGNOSIS — Z23 Encounter for immunization: Secondary | ICD-10-CM | POA: Diagnosis not present

## 2023-07-12 ENCOUNTER — Encounter: Payer: Self-pay | Admitting: Otolaryngology

## 2023-07-18 ENCOUNTER — Other Ambulatory Visit: Payer: Self-pay | Admitting: Family Medicine

## 2023-07-18 ENCOUNTER — Other Ambulatory Visit: Payer: Self-pay

## 2023-07-19 ENCOUNTER — Ambulatory Visit
Admission: RE | Admit: 2023-07-19 | Discharge: 2023-07-19 | Disposition: A | Discharge status: Home / Self Care | Payer: Medicare HMO | Source: Ambulatory Visit | Attending: Otolaryngology | Admitting: Otolaryngology

## 2023-07-19 ENCOUNTER — Other Ambulatory Visit: Payer: Self-pay

## 2023-07-19 DIAGNOSIS — G939 Disorder of brain, unspecified: Secondary | ICD-10-CM | POA: Diagnosis not present

## 2023-07-19 DIAGNOSIS — H9312 Tinnitus, left ear: Secondary | ICD-10-CM

## 2023-07-19 MED ORDER — GADOPICLENOL 0.5 MMOL/ML IV SOLN
10.0000 mL | Freq: Once | INTRAVENOUS | Status: AC | PRN
  Administered 2023-07-19: 8 mL via INTRAVENOUS

## 2023-07-19 MED FILL — Acyclovir Tab 400 MG: ORAL | 30 days supply | Qty: 90 | Fill #0 | Status: AC

## 2023-07-19 NOTE — Telephone Encounter (Signed)
Requested medication (s) are due for refill today: expired medication  Requested medication (s) are on the active medication list: yes   Last refill:  06/05/22 #90 2 refills   Future visit scheduled: yes in 5 months   Notes to clinic:  expired medication . Do you want to renew Rx?     Requested Prescriptions  Pending Prescriptions Disp Refills   acyclovir (ZOVIRAX) 400 MG tablet 90 tablet 2    Sig: TAKE 1 TABLET (400 MG TOTAL) BY MOUTH 3 (THREE) TIMES DAILY.     Antimicrobials:  Antiviral Agents - Anti-Herpetic Passed - 07/18/2023  8:05 PM      Passed - Valid encounter within last 12 months    Recent Outpatient Visits           3 weeks ago Welcome to [employer] preventive visit   [place] [facility] [place], Megan P, DO   7 months ago Major depressive disorder with single episode, in full remission [facility])   [place] [facility] [place], Megan P, DO   1 year ago Routine general medical examination at a health care facility   [facility] [place], [place] P, DO   1 year ago Hypercholesteremia   [place] [facility] [place], [place] P, DO   2 years ago Routine general medical examination at a health care facility   [facility] Dorcas Carrow, DO       Future Appointments             In 5 months Dorcas Carrow, DO [place] [facility]

## 2023-07-20 ENCOUNTER — Other Ambulatory Visit: Payer: Self-pay

## 2023-08-02 NOTE — Progress Notes (Unsigned)
Referring Physician:  Geanie Logan, MD [address] [place],  [place] [address]  Primary Physician:  Dorcas Carrow, DO  History of Present Illness: 08/03/2023 Debra Cannon is here today with a chief complaint of incidentally found lesion consistent with meningioma.  She was having headaches as well as hearing loss, and ultimately had an MRI scan which showed this lesion.  She has had headaches for many years.  She is on treatment for that with her primary care provider.  She has no additional symptoms at this time.  Past Surgery: none  Debra Cannon has no symptoms of cervical myelopathy.  The symptoms are causing a significant impact on the patient's life.   I have utilized the care everywhere function in epic to review the outside records available from external health systems.  Review of Systems:  A 10 point review of systems is negative, except for the pertinent positives and negatives detailed in the HPI.  Past Medical History: Past Medical History:  Diagnosis Date   Herpes    Hypercholesteremia    Menopause    Migraine     Past Surgical History: Past Surgical History:  Procedure Laterality Date   APPENDECTOMY     BREAST EXCISIONAL BIOPSY Right 2019   lipoma   COLONOSCOPY  09/03/2008   Dr Mechele Collin   COLONOSCOPY WITH PROPOFOL N/A 06/23/2019   Procedure: COLONOSCOPY WITH PROPOFOL;  Surgeon: Wyline Mood, MD;  Location: [facility];  Service: Gastroenterology;  Laterality: N/A;   laporoscopy     REPAIR ANKLE LIGAMENT Left 2004 & 2005   UPPER GI ENDOSCOPY  2009   Dr Mechele Collin    Allergies: Allergies as of 08/03/2023   (No Known Allergies)    Medications:  Current Outpatient Medications:    acyclovir (ZOVIRAX) 400 MG tablet, Take 1 tablet (400 mg total) by mouth 3 (three) times daily., Disp: 90 tablet, Rfl: 2   B Complex Vitamins (VITAMIN B COMPLEX) TABS, Take 1 tablet by mouth daily., Disp: , Rfl:    benzonatate (TESSALON) 100  MG capsule, Take 1 capsule (100 mg total) by mouth 3 (three) times daily as needed., Disp: 30 capsule, Rfl: 0   Cholecalciferol (VITAMIN D PO), Take 2,000 Units by mouth daily. , Disp: , Rfl:    citalopram (CELEXA) 40 MG tablet, Take 1.5 tablets (60 mg total) by mouth daily., Disp: 135 tablet, Rfl: 1   cyclobenzaprine (FLEXERIL) 10 MG tablet, Take 1 tablet (10 mg total) by mouth at bedtime., Disp: 30 tablet, Rfl: 0   Fexofenadine HCl (ALLEGRA PO), Take by mouth daily., Disp: , Rfl:    fluticasone (FLONASE) 50 MCG/ACT nasal spray, Spray 2 sprays into each nostril once daily., Disp: 16 g, Rfl: 12   LORazepam (ATIVAN) 0.5 MG tablet, Take 1 tablet (0.5 mg total) by mouth 2 (two) times daily as needed for anxiety., Disp: 30 tablet, Rfl: 0   omeprazole (PRILOSEC) 20 MG capsule, Take 20 mg by mouth daily as needed. , Disp: , Rfl:    ondansetron (ZOFRAN-ODT) 4 MG disintegrating tablet, Take 1 tablet (4 mg total) by mouth every 8 (eight) hours as needed., Disp: 20 tablet, Rfl: 0   SUMAtriptan (IMITREX) 100 MG tablet, TAKE 1 TABLET BY MOUTH AS NEEDED. MAY REPEAT IN 2 HOURS IF HEADACHE PERSISTS OR RECURS., Disp: 9 tablet, Rfl: 12   zolmitriptan (ZOMIG) 5 MG tablet, TAKE 1 TABLET BY MOUTH DAILY AS NEEDED., Disp: 6 tablet, Rfl: 12  Current Facility-Administered Medications:  aspirin EC tablet 324 mg, 324 mg, Oral, Once, End, Christopher, MD  Social History: Social History   Tobacco Use   Smoking status: Former    Current packs/day: 0.00    Average packs/day: 1 pack/day for 20.0 years (20.0 ttl pk-yrs)    Types: Cigarettes    Start date: 52    Quit date: 2000    Years since quitting: 24.8   Smokeless tobacco: Never  Vaping Use   Vaping status: Never Used  Substance Use Topics   Alcohol use: Not Currently    Alcohol/week: 0.0 standard drinks of alcohol    Comment: 2 drinks per year   Drug use: No    Family Medical History: Family History  Problem Relation Age of Onset   Osteoporosis  Mother    Thyroid disease Mother    Heart disease Father    Heart attack Father 13   Cancer Sister        endometrial   Cardiomyopathy Brother    Cancer Maternal Grandmother        vulvar   Diabetes Maternal Grandmother    AAA (abdominal aortic aneurysm) Maternal Grandfather    Stroke Paternal Grandmother    Heart disease Paternal Grandfather    Breast cancer Neg Hx     Physical Examination: Vitals:   08/03/23 1119  BP: 132/86    General: Patient is in no apparent distress. Attention to examination is appropriate.  Neck:   Supple.  Full range of motion.  Respiratory: Patient is breathing without any difficulty.   NEUROLOGICAL:     Awake, alert, oriented to person, place, and time.  Speech is clear and fluent.   Cranial Nerves: Pupils equal round and reactive to light.  Facial tone is symmetric.  Facial sensation is symmetric. Shoulder shrug is symmetric. Tongue protrusion is midline.  There is no pronator drift.  Strength: Side Biceps Triceps Deltoid Interossei Grip Wrist Ext. Wrist Flex.  R 5 5 5 5 5 5 5   L 5 5 5 5 5 5 5    Side Iliopsoas Quads Hamstring PF DF EHL  R 5 5 5 5 5 5   L 5 5 5 5 5 5    Reflexes are 1+ and symmetric at the biceps, triceps, brachioradialis, patella and achilles.   Hoffman's is absent.   Bilateral upper and lower extremity sensation is intact to light touch.    No evidence of dysmetria noted.  Gait is normal.     Medical Decision Making  Imaging: MRI Brain 07/19/2023 IMPRESSION: 1. 11 mm extra-axial dural-based mass overlying the medial right parietal lobe, abutting the superior sagittal sinus, most consistent with a meningioma. The adjacent superior sagittal sinus is patent, however, mild superior sagittal sinus invasion cannot be excluded. The mass mildly indents the underlying brain parenchyma without underlying parenchymal edema. 2. No cerebellopontine angle or internal auditory canal mass. 3. No specific etiology of hearing  loss or tinnitus is identified. 4. Mild generalized cerebral volume loss.     Electronically Signed   By: Jackey Loge D.O.   On: 07/21/2023 10:06    I have personally reviewed the images and agree with the above interpretation.  Assessment and Plan: [name] is a pleasant 67 y.o. female with extra-axial lesion in the right parietal lobe consistent with a meningioma.  This is small and does not show any concerning features at this time.  I have recommended a follow-up MRI scan in approximately 3 months.  If that is stable, we will  move to annual scans.    Thank you for involving me in the care of this patient.      Timm Bonenberger K. Myer Haff MD, [facility] Neurosurgery

## 2023-08-03 ENCOUNTER — Ambulatory Visit: Payer: Medicare HMO | Admitting: Neurosurgery

## 2023-08-03 ENCOUNTER — Encounter: Payer: Self-pay | Admitting: Neurosurgery

## 2023-08-03 VITALS — BP 132/86 | Ht 68.0 in | Wt 183.0 lb

## 2023-08-03 DIAGNOSIS — D329 Benign neoplasm of meninges, unspecified: Secondary | ICD-10-CM

## 2023-08-25 ENCOUNTER — Other Ambulatory Visit: Payer: Self-pay

## 2023-08-25 ENCOUNTER — Telehealth: Payer: Medicare HMO | Admitting: Physician Assistant

## 2023-08-25 DIAGNOSIS — J019 Acute sinusitis, unspecified: Secondary | ICD-10-CM | POA: Diagnosis not present

## 2023-08-25 DIAGNOSIS — U071 COVID-19: Secondary | ICD-10-CM | POA: Diagnosis not present

## 2023-08-25 DIAGNOSIS — B999 Unspecified infectious disease: Secondary | ICD-10-CM | POA: Diagnosis not present

## 2023-08-25 DIAGNOSIS — B9689 Other specified bacterial agents as the cause of diseases classified elsewhere: Secondary | ICD-10-CM | POA: Diagnosis not present

## 2023-08-25 MED ORDER — AMOXICILLIN-POT CLAVULANATE 875-125 MG PO TABS
1.0000 | ORAL_TABLET | Freq: Two times a day (BID) | ORAL | 0 refills | Status: DC
Start: 2023-08-25 — End: 2023-09-09
  Filled 2023-08-25: qty 20, 10d supply, fill #0

## 2023-08-25 NOTE — Progress Notes (Signed)
Virtual Visit Consent   Debra Cannon, you are scheduled for a virtual visit with a [facility] provider today. Just as with appointments in the office, your consent must be obtained to participate. Your consent will be active for this visit and any virtual visit you may have with one of our providers in the next 365 days. If you have a MyChart account, a copy of this consent can be sent to you electronically.  As this is a virtual visit, video technology does not allow for your provider to perform a traditional examination. This may limit your provider's ability to fully assess your condition. If your provider identifies any concerns that need to be evaluated in person or the need to arrange testing (such as labs, EKG, etc.), we will make arrangements to do so. Although advances in technology are sophisticated, we cannot ensure that it will always work on either your end or our end. If the connection with a video visit is poor, the visit may have to be switched to a telephone visit. With either a video or telephone visit, we are not always able to ensure that we have a secure connection.  By engaging in this virtual visit, you consent to the provision of healthcare and authorize for your insurance to be billed (if applicable) for the services provided during this visit. Depending on your insurance coverage, you may receive a charge related to this service.  I need to obtain your verbal consent now. Are you willing to proceed with your visit today? Debra Cannon has provided verbal consent on 08/25/2023 for a virtual visit (video or telephone). [name], PA-C  Date: 08/25/2023 9:06 AM  Virtual Visit via Video Note   I, [name], connected with  Debra Cannon  ([id number], 05/06/1962) on 08/25/23 at  9:00 AM EST by a video-enabled telemedicine application and verified that I am speaking with the correct person using two identifiers.  Location: Patient: Virtual Visit Location  Patient: Home Provider: Virtual Visit Location Provider: Home Office   I discussed the limitations of evaluation and management by telemedicine and the availability of in person appointments. The patient expressed understanding and agreed to proceed.    History of Present Illness: Debra Cannon is a 67 y.o. who identifies as a female who was assigned female at birth, and is being seen today for Covid 27.  HPI: URI  This is a new problem. The current episode started in the past 7 days (Tested positive for Covid 19 on 08/21/23; Symptoms started on Friday, 08/20/23). The problem has been gradually worsening. There has been no fever. Associated symptoms include congestion, coughing (mild and improving), ear pain, headaches, nausea (mild), a plugged ear sensation, rhinorrhea, sinus pain and a sore throat (initially, but improved). Pertinent negatives include no diarrhea, vomiting or wheezing. Treatments tried: tylenol, tessalon perles, nyquil. The treatment provided no relief.    Has had Covid vaccine this year.  Had Covid once before in 2021 and tried an antiviral but caused severe headache.  Problems:  Patient Active Problem List   Diagnosis Date Noted   Advance directive discussed with patient 06/26/2023   Major depression in full remission (HCC) 01/03/2018   Insomnia 11/02/2016   Herpes simplex 08/30/2015   Hypercholesteremia 08/30/2015   Menopause 08/30/2015   Migraine 08/30/2015    Allergies: No Known Allergies Medications:  Current Outpatient Medications:    amoxicillin-clavulanate (AUGMENTIN) 875-125 MG tablet, Take 1 tablet by mouth 2 (two) times daily.,  Disp: 20 tablet, Rfl: 0   acyclovir (ZOVIRAX) 400 MG tablet, Take 1 tablet (400 mg total) by mouth 3 (three) times daily., Disp: 90 tablet, Rfl: 2   B Complex Vitamins (VITAMIN B COMPLEX) TABS, Take 1 tablet by mouth daily., Disp: , Rfl:    benzonatate (TESSALON) 100 MG capsule, Take 1 capsule (100 mg total) by mouth 3 (three)  times daily as needed., Disp: 30 capsule, Rfl: 0   Cholecalciferol (VITAMIN D PO), Take 2,000 Units by mouth daily. , Disp: , Rfl:    citalopram (CELEXA) 40 MG tablet, Take 1.5 tablets (60 mg total) by mouth daily., Disp: 135 tablet, Rfl: 1   cyclobenzaprine (FLEXERIL) 10 MG tablet, Take 1 tablet (10 mg total) by mouth at bedtime., Disp: 30 tablet, Rfl: 0   Fexofenadine HCl (ALLEGRA PO), Take by mouth daily., Disp: , Rfl:    fluticasone (FLONASE) 50 MCG/ACT nasal spray, Spray 2 sprays into each nostril once daily., Disp: 16 g, Rfl: 12   LORazepam (ATIVAN) 0.5 MG tablet, Take 1 tablet (0.5 mg total) by mouth 2 (two) times daily as needed for anxiety., Disp: 30 tablet, Rfl: 0   omeprazole (PRILOSEC) 20 MG capsule, Take 20 mg by mouth daily as needed. , Disp: , Rfl:    ondansetron (ZOFRAN-ODT) 4 MG disintegrating tablet, Take 1 tablet (4 mg total) by mouth every 8 (eight) hours as needed., Disp: 20 tablet, Rfl: 0   SUMAtriptan (IMITREX) 100 MG tablet, TAKE 1 TABLET BY MOUTH AS NEEDED. MAY REPEAT IN 2 HOURS IF HEADACHE PERSISTS OR RECURS., Disp: 9 tablet, Rfl: 12   zolmitriptan (ZOMIG) 5 MG tablet, TAKE 1 TABLET BY MOUTH DAILY AS NEEDED., Disp: 6 tablet, Rfl: 12  Current Facility-Administered Medications:    aspirin EC tablet 324 mg, 324 mg, Oral, Once, End, Christopher, MD  Observations/Objective: Patient is well-developed, well-nourished in no acute distress.  Resting comfortably at home.  Head is normocephalic, atraumatic.  No labored breathing.  Speech is clear and coherent with logical content.  Patient is alert and oriented at baseline.    Assessment and Plan: 1. COVID-19  2. Superimposed infection - amoxicillin-clavulanate (AUGMENTIN) 875-125 MG tablet; Take 1 tablet by mouth 2 (two) times daily.  Dispense: 20 tablet; Refill: 0  3. Acute bacterial sinusitis - amoxicillin-clavulanate (AUGMENTIN) 875-125 MG tablet; Take 1 tablet by mouth 2 (two) times daily.  Dispense: 20 tablet;  Refill: 0  - Worsening symptoms that have not responded to OTC medications.  - Will give Augmentin - Continue allergy medications.  - Steam and humidifier can help - Stay well hydrated and get plenty of rest.  - Seek in person evaluation if no symptom improvement or if symptoms worsen   Follow Up Instructions: I discussed the assessment and treatment plan with the patient. The patient was provided an opportunity to ask questions and all were answered. The patient agreed with the plan and demonstrated an understanding of the instructions.  A copy of instructions were sent to the patient via MyChart unless otherwise noted below.    The patient was advised to call back or seek an in-person evaluation if the symptoms worsen or if the condition fails to improve as anticipated.    [name], PA-C

## 2023-08-25 NOTE — Patient Instructions (Signed)
Scarlett Presto, thank you for joining [name], PA-C for today's virtual visit.  While this provider is not your primary care provider (PCP), if your PCP is located in our provider database this encounter information will be shared with them immediately following your visit.   A [facility] MyChart account gives you access to today's visit and all your visits, tests, and labs performed at [facility] " click here if you don't have a [facility] MyChart account or go to [email or web address]  Consent: (Patient) Debra Cannon provided verbal consent for this virtual visit at the beginning of the encounter.  Current Medications:  Current Outpatient Medications:    amoxicillin-clavulanate (AUGMENTIN) 875-125 MG tablet, Take 1 tablet by mouth 2 (two) times daily., Disp: 20 tablet, Rfl: 0   acyclovir (ZOVIRAX) 400 MG tablet, Take 1 tablet (400 mg total) by mouth 3 (three) times daily., Disp: 90 tablet, Rfl: 2   B Complex Vitamins (VITAMIN B COMPLEX) TABS, Take 1 tablet by mouth daily., Disp: , Rfl:    benzonatate (TESSALON) 100 MG capsule, Take 1 capsule (100 mg total) by mouth 3 (three) times daily as needed., Disp: 30 capsule, Rfl: 0   Cholecalciferol (VITAMIN D PO), Take 2,000 Units by mouth daily. , Disp: , Rfl:    citalopram (CELEXA) 40 MG tablet, Take 1.5 tablets (60 mg total) by mouth daily., Disp: 135 tablet, Rfl: 1   cyclobenzaprine (FLEXERIL) 10 MG tablet, Take 1 tablet (10 mg total) by mouth at bedtime., Disp: 30 tablet, Rfl: 0   Fexofenadine HCl (ALLEGRA PO), Take by mouth daily., Disp: , Rfl:    fluticasone (FLONASE) 50 MCG/ACT nasal spray, Spray 2 sprays into each nostril once daily., Disp: 16 g, Rfl: 12   LORazepam (ATIVAN) 0.5 MG tablet, Take 1 tablet (0.5 mg total) by mouth 2 (two) times daily as needed for anxiety., Disp: 30 tablet, Rfl: 0   omeprazole (PRILOSEC) 20 MG capsule, Take 20 mg by mouth daily as needed. , Disp: , Rfl:    ondansetron (ZOFRAN-ODT)  4 MG disintegrating tablet, Take 1 tablet (4 mg total) by mouth every 8 (eight) hours as needed., Disp: 20 tablet, Rfl: 0   SUMAtriptan (IMITREX) 100 MG tablet, TAKE 1 TABLET BY MOUTH AS NEEDED. MAY REPEAT IN 2 HOURS IF HEADACHE PERSISTS OR RECURS., Disp: 9 tablet, Rfl: 12   zolmitriptan (ZOMIG) 5 MG tablet, TAKE 1 TABLET BY MOUTH DAILY AS NEEDED., Disp: 6 tablet, Rfl: 12  Current Facility-Administered Medications:    aspirin EC tablet 324 mg, 324 mg, Oral, Once, End, Christopher, MD   Medications ordered in this encounter:  Meds ordered this encounter  Medications   amoxicillin-clavulanate (AUGMENTIN) 875-125 MG tablet    Sig: Take 1 tablet by mouth 2 (two) times daily.    Dispense:  20 tablet    Refill:  0    Order Specific Question:   Supervising Provider    Answer:   Merrilee Jansky [id number]     *If you need refills on other medications prior to your next appointment, please contact your pharmacy*  Follow-Up: Call back or seek an in-person evaluation if the symptoms worsen or if the condition fails to improve as anticipated.  [facility] [phone]  Care Instructions: Sinus Infection, Adult A sinus infection, also called sinusitis, is inflammation of your sinuses. Sinuses are hollow spaces in the bones around your face. Your sinuses are located: Around your eyes. In the middle of your forehead. Behind  your nose. In your cheekbones. Mucus normally drains out of your sinuses. When your nasal tissues become inflamed or swollen, mucus can become trapped or blocked. This allows bacteria, viruses, and fungi to grow, which leads to infection. Most infections of the sinuses are caused by a virus. A sinus infection can develop quickly. It can last for up to 4 weeks (acute) or for more than 12 weeks (chronic). A sinus infection often develops after a cold. What are the causes? This condition is caused by anything that creates swelling in the sinuses or stops mucus  from draining. This includes: Allergies. Asthma. Infection from bacteria or viruses. Deformities or blockages in your nose or sinuses. Abnormal growths in the nose (nasal polyps). Pollutants, such as chemicals or irritants in the air. Infection from fungi. This is rare. What increases the risk? You are more likely to develop this condition if you: Have a weak body defense system (immune system). Do a lot of swimming or diving. Overuse nasal sprays. Smoke. What are the signs or symptoms? The main symptoms of this condition are pain and a feeling of pressure around the affected sinuses. Other symptoms include: Stuffy nose or congestion that makes it difficult to breathe through your nose. Thick yellow or greenish drainage from your nose. Tenderness, swelling, and warmth over the affected sinuses. A cough that may get worse at night. Decreased sense of smell and taste. Extra mucus that collects in the throat or the back of the nose (postnasal drip) causing a sore throat or bad breath. Tiredness (fatigue). Fever. How is this diagnosed? This condition is diagnosed based on: Your symptoms. Your medical history. A physical exam. Tests to find out if your condition is acute or chronic. This may include: Checking your nose for nasal polyps. Viewing your sinuses using a device that has a light (endoscope). Testing for allergies or bacteria. Imaging tests, such as an MRI or CT scan. In rare cases, a bone biopsy may be done to rule out more serious types of fungal sinus disease. How is this treated? Treatment for a sinus infection depends on the cause and whether your condition is chronic or acute. If caused by a virus, your symptoms should go away on their own within 10 days. You may be given medicines to relieve symptoms. They include: Medicines that shrink swollen nasal passages (decongestants). A spray that eases inflammation of the nostrils (topical intranasal  corticosteroids). Rinses that help get rid of thick mucus in your nose (nasal saline washes). Medicines that treat allergies (antihistamines). Over-the-counter pain relievers. If caused by bacteria, your health care provider may recommend waiting to see if your symptoms improve. Most bacterial infections will get better without antibiotic medicine. You may be given antibiotics if you have: A severe infection. A weak immune system. If caused by narrow nasal passages or nasal polyps, surgery may be needed. Follow these instructions at home: Medicines Take, use, or apply over-the-counter and prescription medicines only as told by your health care provider. These may include nasal sprays. If you were prescribed an antibiotic medicine, take it as told by your health care provider. Do not stop taking the antibiotic even if you start to feel better. Hydrate and humidify  Drink enough fluid to keep your urine pale yellow. Staying hydrated will help to thin your mucus. Use a cool mist humidifier to keep the humidity level in your home above 50%. Inhale steam for 10-15 minutes, 3-4 times a day, or as told by your health care provider. You  can do this in the bathroom while a hot shower is running. Limit your exposure to cool or dry air. Rest Rest as much as possible. Sleep with your head raised (elevated). Make sure you get enough sleep each night. General instructions  Apply a warm, moist washcloth to your face 3-4 times a day or as told by your health care provider. This will help with discomfort. Use nasal saline washes as often as told by your health care provider. Wash your hands often with soap and water to reduce your exposure to germs. If soap and water are not available, use hand sanitizer. Do not smoke. Avoid being around people who are smoking (secondhand smoke). Keep all follow-up visits. This is important. Contact a health care provider if: You have a fever. Your symptoms get  worse. Your symptoms do not improve within 10 days. Get help right away if: You have a severe headache. You have persistent vomiting. You have severe pain or swelling around your face or eyes. You have vision problems. You develop confusion. Your neck is stiff. You have trouble breathing. These symptoms may be an emergency. Get help right away. Call 911. Do not wait to see if the symptoms will go away. Do not drive yourself to the hospital. Summary A sinus infection is soreness and inflammation of your sinuses. Sinuses are hollow spaces in the bones around your face. This condition is caused by nasal tissues that become inflamed or swollen. The swelling traps or blocks the flow of mucus. This allows bacteria, viruses, and fungi to grow, which leads to infection. If you were prescribed an antibiotic medicine, take it as told by your health care provider. Do not stop taking the antibiotic even if you start to feel better. Keep all follow-up visits. This is important. This information is not intended to replace advice given to you by your health care provider. Make sure you discuss any questions you have with your health care provider. Document Revised: 08/26/2021 Document Reviewed: 08/26/2021 Elsevier Patient Education  2024 Elsevier Inc.    Isolation Instructions: You are to isolate at home until you have been fever free for at least 24 hours without a fever-reducing medication, and symptoms have been steadily improving for 24 hours. At that time,  you can end isolation but need to mask for an additional 5 days.   If you must be around other household members who do not have symptoms, you need to make sure that both you and the family members are masking consistently with a high-quality mask.  If you note any worsening of symptoms despite treatment, please seek an in-person evaluation ASAP. If you note any significant shortness of breath or any chest pain, please seek ER evaluation.  Please do not delay care!   COVID-19: What to Do if You Are Sick If you test positive and are an older adult or someone who is at high risk of getting very sick from COVID-19, treatment may be available. Contact a healthcare provider right away after a positive test to determine if you are eligible, even if your symptoms are mild right now. You can also visit a Test to Treat location and, if eligible, receive a prescription from a provider. Don't delay: Treatment must be started within the first few days to be effective. If you have a fever, cough, or other symptoms, you might have COVID-19. Most people have mild illness and are able to recover at home. If you are sick: Keep track of your symptoms. If  you have an emergency warning sign (including trouble breathing), call 911. Steps to help prevent the spread of COVID-19 if you are sick If you are sick with COVID-19 or think you might have COVID-19, follow the steps below to care for yourself and to help protect other people in your home and community. Stay home except to get medical care Stay home. Most people with COVID-19 have mild illness and can recover at home without medical care. Do not leave your home, except to get medical care. Do not visit public areas and do not go to places where you are unable to wear a mask. Take care of yourself. Get rest and stay hydrated. Take over-the-counter medicines, such as acetaminophen, to help you feel better. Stay in touch with your doctor. Call before you get medical care. Be sure to get care if you have trouble breathing, or have any other emergency warning signs, or if you think it is an emergency. Avoid public transportation, ride-sharing, or taxis if possible. Get tested If you have symptoms of COVID-19, get tested. While waiting for test results, stay away from others, including staying apart from those living in your household. Get tested as soon as possible after your symptoms start. Treatments may  be available for people with COVID-19 who are at risk for becoming very sick. Don't delay: Treatment must be started early to be effective--some treatments must begin within 5 days of your first symptoms. Contact your healthcare provider right away if your test result is positive to determine if you are eligible. Self-tests are one of several options for testing for the virus that causes COVID-19 and may be more convenient than laboratory-based tests and point-of-care tests. Ask your healthcare provider or your local health department if you need help interpreting your test results. You can visit your state, tribal, local, and territorial health department's website to look for the latest local information on testing sites. Separate yourself from other people As much as possible, stay in a specific room and away from other people and pets in your home. If possible, you should use a separate bathroom. If you need to be around other people or animals in or outside of the home, wear a well-fitting mask. Tell your close contacts that they may have been exposed to COVID-19. An infected person can spread COVID-19 starting 48 hours (or 2 days) before the person has any symptoms or tests positive. By letting your close contacts know they may have been exposed to COVID-19, you are helping to protect everyone. See COVID-19 and Animals if you have questions about pets. If you are diagnosed with COVID-19, someone from the health department may call you. Answer the call to slow the spread. Monitor your symptoms Symptoms of COVID-19 include fever, cough, or other symptoms. Follow care instructions from your healthcare provider and local health department. Your local health authorities may give instructions on checking your symptoms and reporting information. When to seek emergency medical attention Look for emergency warning signs* for COVID-19. If someone is showing any of these signs, seek emergency medical care  immediately: Trouble breathing Persistent pain or pressure in the chest New confusion Inability to wake or stay awake Pale, gray, or blue-colored skin, lips, or nail beds, depending on skin tone *This list is not all possible symptoms. Please call your medical provider for any other symptoms that are severe or concerning to you. Call 911 or call ahead to your local emergency facility: Notify the operator that you are seeking care for  someone who has or may have COVID-19. Call ahead before visiting your doctor Call ahead. Many medical visits for routine care are being postponed or done by phone or telemedicine. If you have a medical appointment that cannot be postponed, call your doctor's office, and tell them you have or may have COVID-19. This will help the office protect themselves and other patients. If you are sick, wear a well-fitting mask You should wear a mask if you must be around other people or animals, including pets (even at home). Wear a mask with the best fit, protection, and comfort for you. You don't need to wear the mask if you are alone. If you can't put on a mask (because of trouble breathing, for example), cover your coughs and sneezes in some other way. Try to stay at least 6 feet away from other people. This will help protect the people around you. Masks should not be placed on young children under age 87 years, anyone who has trouble breathing, or anyone who is not able to remove the mask without help. Cover your coughs and sneezes Cover your mouth and nose with a tissue when you cough or sneeze. Throw away used tissues in a lined trash can. Immediately wash your hands with soap and water for at least 20 seconds. If soap and water are not available, clean your hands with an alcohol-based hand sanitizer that contains at least 60% alcohol. Clean your hands often Wash your hands often with soap and water for at least 20 seconds. This is especially important after blowing your  nose, coughing, or sneezing; going to the bathroom; and before eating or preparing food. Use hand sanitizer if soap and water are not available. Use an alcohol-based hand sanitizer with at least 60% alcohol, covering all surfaces of your hands and rubbing them together until they feel dry. Soap and water are the best option, especially if hands are visibly dirty. Avoid touching your eyes, nose, and mouth with unwashed hands. Handwashing Tips Avoid sharing personal household items Do not share dishes, drinking glasses, cups, eating utensils, towels, or bedding with other people in your home. Wash these items thoroughly after using them with soap and water or put in the dishwasher. Clean surfaces in your home regularly Clean and disinfect high-touch surfaces (for example, doorknobs, tables, handles, light switches, and countertops) in your "sick room" and bathroom. In shared spaces, you should clean and disinfect surfaces and items after each use by the person who is ill. If you are sick and cannot clean, a caregiver or other person should only clean and disinfect the area around you (such as your bedroom and bathroom) on an as needed basis. Your caregiver/other person should wait as long as possible (at least several hours) and wear a mask before entering, cleaning, and disinfecting shared spaces that you use. Clean and disinfect areas that may have blood, stool, or body fluids on them. Use household cleaners and disinfectants. Clean visible dirty surfaces with household cleaners containing soap or detergent. Then, use a household disinfectant. Use a product from [employer] List N: Disinfectants for Coronavirus (COVID-19). Be sure to follow the instructions on the label to ensure safe and effective use of the product. Many products recommend keeping the surface wet with a disinfectant for a certain period of time (look at "contact time" on the product label). You may also need to wear personal protective  equipment, such as gloves, depending on the directions on the product label. Immediately after disinfecting, wash your  hands with soap and water for 20 seconds. For completed guidance on cleaning and disinfecting your home, visit Complete Disinfection Guidance. Take steps to improve ventilation at home Improve ventilation (air flow) at home to help prevent from spreading COVID-19 to other people in your household. Clear out COVID-19 virus particles in the air by opening windows, using air filters, and turning on fans in your home. Use this interactive tool to learn how to improve air flow in your home. When you can be around others after being sick with COVID-19 Deciding when you can be around others is different for different situations. Find out when you can safely end home isolation. For any additional questions about your care, contact your healthcare provider or state or local health department. 12/24/2020 Content source: [facility] for Immunization and Respiratory Diseases ([facility]), Division of Viral Diseases This information is not intended to replace advice given to you by your health care provider. Make sure you discuss any questions you have with your health care provider. Document Revised: 02/06/2021 Document Reviewed: 02/06/2021 Elsevier Patient Education  2022 [employer].     If you have been instructed to have an in-person evaluation today at a local Urgent Care facility, please use the link below. It will take you to a list of all of our available [facility], including address, phone number and hours of operation. Please do not delay care.  [facility]  If you or a family member do not have a primary care provider, use the link below to schedule a visit and establish care. When you choose a [facility] primary care physician or advanced practice provider, you gain a long-term partner in health. Find a Primary Care Provider  Learn more about  [facility] in-office and virtual care options: [facility] - Get Care Now

## 2023-08-31 ENCOUNTER — Other Ambulatory Visit: Payer: Self-pay

## 2023-08-31 MED FILL — Acyclovir Tab 400 MG: ORAL | 30 days supply | Qty: 90 | Fill #1 | Status: AC

## 2023-09-01 ENCOUNTER — Other Ambulatory Visit: Payer: Self-pay

## 2023-09-01 ENCOUNTER — Other Ambulatory Visit: Payer: Self-pay | Admitting: Family Medicine

## 2023-09-01 DIAGNOSIS — B9689 Other specified bacterial agents as the cause of diseases classified elsewhere: Secondary | ICD-10-CM

## 2023-09-01 NOTE — Telephone Encounter (Signed)
Requested medication (s) are due for refill today: review  Requested medication (s) are on the active medication list: yes  Last refill:  01/21/23 #30/0  Future visit scheduled: yes  Notes to clinic:  Unable to refill per protocol, last refill by another provider.    Requested Prescriptions  Pending Prescriptions Disp Refills   benzonatate (TESSALON) 100 MG capsule 30 capsule 0    Sig: Take 1 capsule (100 mg total) by mouth 3 (three) times daily as needed.     Ear, Nose, and Throat:  Antitussives/Expectorants Passed - 09/01/2023  5:30 PM      Passed - Valid encounter within last 12 months    Recent Outpatient Visits           2 months ago Welcome to [employer] preventive visit   [place] [facility], Megan P, DO   9 months ago Major depressive disorder with single episode, in full remission [facility])   [place] [facility] [place], Megan P, DO   1 year ago Routine general medical examination at a health care facility   [facility] [place], [place] P, DO   1 year ago Hypercholesteremia   [place] [facility] [place], [place] P, DO   2 years ago Routine general medical examination at a health care facility   [facility] Dorcas Carrow, DO       Future Appointments             In 3 months Dorcas Carrow, DO [facility]

## 2023-09-05 ENCOUNTER — Other Ambulatory Visit: Payer: Self-pay

## 2023-09-06 ENCOUNTER — Other Ambulatory Visit: Payer: Self-pay

## 2023-09-06 MED FILL — Benzonatate Cap 100 MG: ORAL | 10 days supply | Qty: 30 | Fill #0 | Status: AC

## 2023-09-08 ENCOUNTER — Telehealth: Payer: Medicare HMO | Admitting: Family Medicine

## 2023-09-08 ENCOUNTER — Encounter: Payer: Self-pay | Admitting: Family Medicine

## 2023-09-08 DIAGNOSIS — R5383 Other fatigue: Secondary | ICD-10-CM

## 2023-09-08 DIAGNOSIS — R059 Cough, unspecified: Secondary | ICD-10-CM

## 2023-09-08 NOTE — Progress Notes (Signed)
[place]   Needs to get chest xray to rule our PNA secondary to COVID. Advised that Urgent Care in person is best at this time. Was treated for sinus infection and post covid on 08/25/23. Reports she was better for about 2 days and then got worse again and continues to feel poorly.  Patient acknowledged agreement and understanding of the plan.

## 2023-09-09 ENCOUNTER — Other Ambulatory Visit: Payer: Self-pay

## 2023-09-09 ENCOUNTER — Ambulatory Visit: Payer: Medicare HMO

## 2023-09-09 ENCOUNTER — Ambulatory Visit
Admission: RE | Admit: 2023-09-09 | Discharge: 2023-09-09 | Disposition: A | Discharge status: Home / Self Care | Payer: Medicare HMO | Source: Ambulatory Visit | Attending: Emergency Medicine | Admitting: Emergency Medicine

## 2023-09-09 VITALS — BP 144/86 | HR 73 | Temp 99.0°F | Ht 68.0 in | Wt 180.0 lb

## 2023-09-09 DIAGNOSIS — U071 COVID-19: Secondary | ICD-10-CM | POA: Diagnosis not present

## 2023-09-09 DIAGNOSIS — U099 Post covid-19 condition, unspecified: Secondary | ICD-10-CM

## 2023-09-09 DIAGNOSIS — R059 Cough, unspecified: Secondary | ICD-10-CM | POA: Diagnosis not present

## 2023-09-09 MED ORDER — AEROCHAMBER MV MISC
2 refills | Status: DC
  Filled 2023-09-09: qty 1, fill #0

## 2023-09-09 MED ORDER — ALBUTEROL SULFATE HFA 108 (90 BASE) MCG/ACT IN AERS
2.0000 | INHALATION_SPRAY | RESPIRATORY_TRACT | 0 refills | Status: DC | PRN
  Filled 2023-09-09: qty 6.7, 30d supply, fill #0

## 2023-09-09 MED ORDER — BENZONATATE 100 MG PO CAPS
200.0000 mg | ORAL_CAPSULE | Freq: Three times a day (TID) | ORAL | 1 refills | Status: DC
  Filled 2023-09-09: qty 30, 5d supply, fill #0

## 2023-09-09 NOTE — ED Provider Notes (Addendum)
[facility]    CSN: [id number] Arrival date & time: 09/09/23  1251      History   Chief Complaint Chief Complaint  Patient presents with   Cough    HPI Debra Cannon is a 67 y.o. female.   HPI  67 year old female with a past medical history significant for high cholesterol and migraines presents for evaluation of ongoing respiratory issues.  She was diagnosed with COVID on 08/20/2023.  On 08/25/2023 she had a virtual visit and she was treated for an upper respiratory infection with a 10-day course of Augmentin and also Tessalon Perles.  She reports that she is continuing to experience nasal congestion, nasal discharge, and a nonproductive cough.  She also has had some shortness of breath but no wheezing or fever.  She was advised to come get a chest x-ray to rule out pneumonia.  Past Medical History:  Diagnosis Date   Herpes    Hypercholesteremia    Menopause    Migraine     Patient Active Problem List   Diagnosis Date Noted   Advance directive discussed with patient 06/26/2023   Major depression in full remission (HCC) 01/03/2018   Insomnia 11/02/2016   Herpes simplex 08/30/2015   Hypercholesteremia 08/30/2015   Menopause 08/30/2015   Migraine 08/30/2015    Past Surgical History:  Procedure Laterality Date   APPENDECTOMY     BREAST EXCISIONAL BIOPSY Right 2019   lipoma   COLONOSCOPY  09/03/2008   Dr Mechele Collin   COLONOSCOPY WITH PROPOFOL N/A 06/23/2019   Procedure: COLONOSCOPY WITH PROPOFOL;  Surgeon: Wyline Mood, MD;  Location: [facility] ENDOSCOPY;  Service: Gastroenterology;  Laterality: N/A;   laporoscopy     REPAIR ANKLE LIGAMENT Left 2004 & 2005   UPPER GI ENDOSCOPY  2009   Dr Mechele Collin    OB History     Gravida  0   Para  0   Term  0   Preterm  0   AB  0   Living  0      SAB  0   IAB  0   Ectopic  0   Multiple  0   Live Births  0        Obstetric Comments  1st Menstrual Cycle:  14             Home Medications     Prior to Admission medications   Medication Sig Start Date End Date Taking? Authorizing Provider  acyclovir (ZOVIRAX) 400 MG tablet Take 1 tablet (400 mg total) by mouth 3 (three) times daily. 07/19/23  Yes Johnson, Megan P, DO  albuterol (VENTOLIN HFA) 108 (90 Base) MCG/ACT inhaler Inhale 2 puffs into the lungs every 4 (four) hours as needed. 09/09/23  Yes Becky Augusta, NP  B Complex Vitamins (VITAMIN B COMPLEX) TABS Take 1 tablet by mouth daily. 05/04/17  Yes [provider]  benzonatate (TESSALON) 100 MG capsule Take 2 capsules (200 mg total) by mouth every 8 (eight) hours. 09/09/23  Yes Becky Augusta, NP  Cholecalciferol (VITAMIN D PO) Take 2,000 Units by mouth daily.    Yes [provider]  citalopram (CELEXA) 40 MG tablet Take 1.5 tablets (60 mg total) by mouth daily. 06/22/23 06/21/24 Yes Johnson, Megan P, DO  cyclobenzaprine (FLEXERIL) 10 MG tablet Take 1 tablet (10 mg total) by mouth at bedtime. 12/02/21  Yes Johnson, Megan P, DO  Fexofenadine HCl (ALLEGRA PO) Take by mouth daily.   Yes [provider]  fluticasone (  FLONASE) 50 MCG/ACT nasal spray Spray 2 sprays into each nostril once daily. 06/10/23  Yes   LORazepam (ATIVAN) 0.5 MG tablet Take 1 tablet (0.5 mg total) by mouth 2 (two) times daily as needed for anxiety. 06/22/23  Yes Johnson, Megan P, DO  omeprazole (PRILOSEC) 20 MG capsule Take 20 mg by mouth daily as needed.    Yes [provider]  ondansetron (ZOFRAN-ODT) 4 MG disintegrating tablet Take 1 tablet (4 mg total) by mouth every 8 (eight) hours as needed. 01/21/23  Yes [name], PA-C  Spacer/Aero-Holding Chambers (AEROCHAMBER MV) inhaler Use as instructed 09/09/23  Yes Becky Augusta, NP  SUMAtriptan (IMITREX) 100 MG tablet TAKE 1 TABLET BY MOUTH AS NEEDED. MAY REPEAT IN 2 HOURS IF HEADACHE PERSISTS OR RECURS. 06/22/23 06/21/24 Yes Johnson, Megan P, DO  zolmitriptan (ZOMIG) 5 MG tablet TAKE 1 TABLET BY MOUTH DAILY AS NEEDED. 06/22/23 06/21/24  Yes Johnson, Oralia Rud, DO    Family History Family History  Problem Relation Age of Onset   Osteoporosis Mother    Thyroid disease Mother    Heart disease Father    Heart attack Father 71   Cancer Sister        endometrial   Cardiomyopathy Brother    Cancer Maternal Grandmother        vulvar   Diabetes Maternal Grandmother    AAA (abdominal aortic aneurysm) Maternal Grandfather    Stroke Paternal Grandmother    Heart disease Paternal Grandfather    Breast cancer Neg Hx     Social History Social History   Tobacco Use   Smoking status: Former    Current packs/day: 0.00    Average packs/day: 1 pack/day for 20.0 years (20.0 ttl pk-yrs)    Types: Cigarettes    Start date: 55    Quit date: 2000    Years since quitting: 24.9   Smokeless tobacco: Never  Vaping Use   Vaping status: Never Used  Substance Use Topics   Alcohol use: Not Currently    Alcohol/week: 0.0 standard drinks of alcohol    Comment: 2 drinks per year   Drug use: No     Allergies   Patient has no known allergies.   Review of Systems Review of Systems  Constitutional:  Negative for fever.  HENT:  Positive for congestion and rhinorrhea.   Respiratory:  Positive for cough and shortness of breath. Negative for wheezing.      Physical Exam Triage Vital Signs ED Triage Vitals  Encounter Vitals Group     BP      Systolic BP Percentile      Diastolic BP Percentile      Pulse      Resp      Temp      Temp src      SpO2      Weight      Height      Head Circumference      Peak Flow      Pain Score      Pain Loc      Pain Education      Exclude from Growth Chart    No data found.  Updated Vital Signs BP (!) 144/86 (BP Location: Left Arm)   Pulse 73   Temp 99 F (37.2 C) (Oral)   Ht 5\' 8"  (1.727 m)   Wt 180 lb (81.6 kg)   LMP  (LMP Unknown)   SpO2 94%   BMI 27.37 kg/m  Visual Acuity Right Eye Distance:   Left Eye Distance:   Bilateral Distance:    Right Eye Near:   Left  Eye Near:    Bilateral Near:     Physical Exam Vitals and nursing note reviewed.  Constitutional:      Appearance: Normal appearance. She is not ill-appearing.  HENT:     Head: Normocephalic and atraumatic.     Right Ear: Tympanic membrane, ear canal and external ear normal. There is no impacted cerumen.     Left Ear: Tympanic membrane, ear canal and external ear normal. There is no impacted cerumen.     Nose: Congestion and rhinorrhea present.     Comments: Close is mildly edematous with scant clear discharge in both nares.    Mouth/Throat:     Mouth: Mucous membranes are moist.     Pharynx: Oropharynx is clear. No oropharyngeal exudate or posterior oropharyngeal erythema.  Cardiovascular:     Rate and Rhythm: Normal rate and regular rhythm.     Pulses: Normal pulses.     Heart sounds: Normal heart sounds. No murmur heard.    No friction rub. No gallop.  Pulmonary:     Effort: Pulmonary effort is normal.     Breath sounds: Normal breath sounds. No wheezing, rhonchi or rales.  Musculoskeletal:     Cervical back: Normal range of motion and neck supple. No tenderness.  Lymphadenopathy:     Cervical: No cervical adenopathy.  Skin:    General: Skin is warm and dry.     Capillary Refill: Capillary refill takes less than 2 seconds.     Findings: No rash.  Neurological:     General: No focal deficit present.     Mental Status: She is alert and oriented to person, place, and time.      UC Treatments / Results  Labs (all labs ordered are listed, but only abnormal results are displayed) Labs Reviewed - No data to display  EKG   Radiology No results found.  Procedures Procedures (including critical care time)  Medications Ordered in UC Medications - No data to display  Initial Impression / Assessment and Plan / UC Course  I have reviewed the triage vital signs and the nursing notes.  Pertinent labs & imaging results that were available during my care of the patient  were reviewed by me and considered in my medical decision making (see chart for details).   Patient is a pleasant, nontoxic-appearing 67 year old female presenting for evaluation of ongoing respiratory symptoms in the setting of recent COVID diagnosis.  She is able to speak in full sentence without dyspnea or tachypnea though she does indicate that she has been experiencing a nonproductive cough and some shortness of breath.  No wheezing.  Room air oxygen saturation is soft at 94%.  She has a mildly elevated temp of 99.  Pulse rate is normal at 73.  Cardiopulmonary exam reveals clear lung sounds in all fields.  We discussed that COVID is an inflammatory process and her symptoms may be the result of lingering pulmonary inflammation as a result of the COVID infection.  However, I will obtain a chest x-ray to rule out any acute cardiopulmonary pathology.  If her chest x-ray is positive for pneumonia I will treat her with a 5-day course of azithromycin as she is already been treated with a 10-day course of Augmentin.   This x-ray independently reviewed and evaluated by me.  Impression: Lung fields are well aerated and no evidence  of infiltrate or effusion noted.  Cardiomediastinal silhouette appears normal.  Radiology overread is pending. Radiology impression states no active cardiopulmonary process.  I will discharge patient home and diagnosed her with post-COVID cough with a prescription for an albuterol inhaler and a spacer that she can use for every 4-6 hours needed for shortness with wheezing, Tessalon Perles, and she can use over-the-counter cough preparations such as Delsym, Robitussin, or Zarbee's.  Final Clinical Impressions(s) / UC Diagnoses   Final diagnoses:  Post-COVID syndrome     Discharge Instructions      Your chest x-ray did not show any evidence of pneumonia.  I do suspect that your symptoms are most likely secondary to the inflammatory process that is COVID-19.  Use the  albuterol inhaler with the spacer, 1 to 2 puffs every 4-6 hours as needed for any cough or shortness of breath.  You may continue to use the Tessalon Perles every 8 hours as needed for cough.  Take them with a small sip of water.  They may give you numbness to the base of your tongue or a metallic taste in her mouth, this is normal.  You may also use over-the-counter cough preparations such as Delsym, Robitussin, or Zarbee's.  If your cough lingers for longer than 6 weeks after your COVID infection you could be developing what is termed a long COVID.  If your cough does not resolve I recommend that you follow-up with your primary care provider.     ED Prescriptions     Medication Sig Dispense Auth. Provider   albuterol (VENTOLIN HFA) 108 (90 Base) MCG/ACT inhaler Inhale 2 puffs into the lungs every 4 (four) hours as needed. 18 g Becky Augusta, NP   Spacer/Aero-Holding Chambers (AEROCHAMBER MV) inhaler Use as instructed 1 each Becky Augusta, NP   benzonatate (TESSALON) 100 MG capsule Take 2 capsules (200 mg total) by mouth every 8 (eight) hours. 30 capsule Becky Augusta, NP      PDMP not reviewed this encounter.   Becky Augusta, NP 09/09/23 1404    Becky Augusta, NP 09/09/23 1536

## 2023-09-09 NOTE — ED Triage Notes (Signed)
Pt c/o cough, congestion, recent covid positive x76month  Pt tested positive for covid on 08/20/23.   Pt did a virtual appointment on 08/25/23 and was put on Augmentin and tessalon pearls.   Pt was told by virtual appointment to come in for a chest xray for pneumonia.

## 2023-09-09 NOTE — Discharge Instructions (Addendum)
Your chest x-ray did not show any evidence of pneumonia.  I do suspect that your symptoms are most likely secondary to the inflammatory process that is COVID-19.  Use the albuterol inhaler with the spacer, 1 to 2 puffs every 4-6 hours as needed for any cough or shortness of breath.  You may continue to use the Tessalon Perles every 8 hours as needed for cough.  Take them with a small sip of water.  They may give you numbness to the base of your tongue or a metallic taste in her mouth, this is normal.  You may also use over-the-counter cough preparations such as Delsym, Robitussin, or Zarbee's.  If your cough lingers for longer than 6 weeks after your COVID infection you could be developing what is termed a long COVID.  If your cough does not resolve I recommend that you follow-up with your primary care provider.

## 2023-10-08 ENCOUNTER — Other Ambulatory Visit: Payer: Self-pay

## 2023-10-21 ENCOUNTER — Encounter: Payer: Self-pay | Admitting: Neurosurgery

## 2023-11-02 ENCOUNTER — Other Ambulatory Visit (HOSPITAL_COMMUNITY): Payer: Self-pay

## 2023-11-02 ENCOUNTER — Other Ambulatory Visit: Payer: Self-pay

## 2023-11-02 ENCOUNTER — Other Ambulatory Visit: Payer: Self-pay | Admitting: Family Medicine

## 2023-11-02 MED ORDER — ZOLMITRIPTAN 5 MG PO TABS
ORAL_TABLET | Freq: Every day | ORAL | 12 refills | Status: DC | PRN
  Filled 2023-11-02: qty 6, 15d supply, fill #0

## 2023-11-02 MED FILL — Acyclovir Tab 400 MG: ORAL | 30 days supply | Qty: 90 | Fill #2 | Status: AC

## 2023-11-03 ENCOUNTER — Encounter: Payer: Self-pay | Admitting: Family Medicine

## 2023-11-03 ENCOUNTER — Ambulatory Visit
Admission: RE | Admit: 2023-11-03 | Discharge: 2023-11-03 | Disposition: A | Discharge status: Home / Self Care | Payer: Medicare Other | Source: Ambulatory Visit | Attending: Neurosurgery | Admitting: Neurosurgery

## 2023-11-03 DIAGNOSIS — D320 Benign neoplasm of cerebral meninges: Secondary | ICD-10-CM | POA: Diagnosis not present

## 2023-11-03 DIAGNOSIS — D329 Benign neoplasm of meninges, unspecified: Secondary | ICD-10-CM

## 2023-11-03 MED ORDER — GADOPICLENOL 0.5 MMOL/ML IV SOLN
7.5000 mL | Freq: Once | INTRAVENOUS | Status: AC | PRN
Start: 2023-11-03 — End: 2023-11-03
  Administered 2023-11-03: 7.5 mL via INTRAVENOUS

## 2023-11-09 ENCOUNTER — Encounter: Payer: Self-pay | Admitting: Neurosurgery

## 2023-11-12 ENCOUNTER — Other Ambulatory Visit: Payer: Self-pay | Admitting: Neurosurgery

## 2023-11-12 ENCOUNTER — Encounter: Payer: Self-pay | Admitting: Neurosurgery

## 2023-11-12 DIAGNOSIS — D329 Benign neoplasm of meninges, unspecified: Secondary | ICD-10-CM

## 2023-11-30 DIAGNOSIS — K080 Exfoliation of teeth due to systemic causes: Secondary | ICD-10-CM | POA: Diagnosis not present

## 2023-12-20 ENCOUNTER — Ambulatory Visit (INDEPENDENT_AMBULATORY_CARE_PROVIDER_SITE_OTHER): Payer: Medicare Other | Admitting: Family Medicine

## 2023-12-20 ENCOUNTER — Encounter: Payer: Self-pay | Admitting: Family Medicine

## 2023-12-20 ENCOUNTER — Other Ambulatory Visit: Payer: Self-pay

## 2023-12-20 DIAGNOSIS — F325 Major depressive disorder, single episode, in full remission: Secondary | ICD-10-CM | POA: Diagnosis not present

## 2023-12-20 MED ORDER — CITALOPRAM HYDROBROMIDE 20 MG PO TABS
20.0000 mg | ORAL_TABLET | Freq: Every day | ORAL | 1 refills | Status: DC
  Filled 2023-12-20: qty 90, 90d supply, fill #0
  Filled 2024-04-05: qty 90, 90d supply, fill #1

## 2023-12-20 MED ORDER — LORAZEPAM 0.5 MG PO TABS
0.5000 mg | ORAL_TABLET | Freq: Two times a day (BID) | ORAL | 0 refills | Status: DC | PRN
  Filled 2023-12-20: qty 30, 15d supply, fill #0

## 2023-12-20 MED ORDER — CITALOPRAM HYDROBROMIDE 40 MG PO TABS
40.0000 mg | ORAL_TABLET | Freq: Every day | ORAL | 1 refills | Status: DC
  Filled 2023-12-20: qty 90, 90d supply, fill #0
  Filled 2024-05-31: qty 90, 90d supply, fill #1

## 2023-12-20 MED ORDER — ACYCLOVIR 400 MG PO TABS
400.0000 mg | ORAL_TABLET | Freq: Three times a day (TID) | ORAL | 2 refills | Status: DC
  Filled 2023-12-20: qty 90, 30d supply, fill #0
  Filled 2024-03-02: qty 90, 30d supply, fill #1
  Filled 2024-04-05: qty 90, 30d supply, fill #2

## 2023-12-20 NOTE — Progress Notes (Signed)
 BP 111/69 (BP Location: Left Arm, Patient Position: Sitting, Cuff Size: Large)   Pulse 73   Ht 5\' 8"  (1.727 m)   Wt 186 lb 3.2 oz (84.5 kg)   LMP  (LMP Unknown)   SpO2 93%   BMI 28.31 kg/m    Subjective:    Patient ID: Debra Cannon, female    DOB: 07-Sep-1956, 68 y.o.   MRN: [id number]  HPI: Debra Cannon is a 68 y.o. female  Chief Complaint  Patient presents with   Depression    Insurance not covering Celexa and sent a list of alternatives    DEPRESSION Duration: chronic Mood status: controlled Satisfied with current treatment?: yes Symptom severity: mild  Duration of current treatment : chronic Side effects: no Medication compliance: excellent compliance Psychotherapy/counseling: no  Previous psychiatric medications: celexa Depressed mood: no Anxious mood: no Anhedonia: no Significant weight loss or gain: no Insomnia: no  Fatigue: yes Feelings of worthlessness or guilt: no Impaired concentration/indecisiveness: no Suicidal ideations: no Hopelessness: no Crying spells: no    12/20/2023    1:31 PM 06/22/2023    1:36 PM 12/04/2022    1:53 PM 06/05/2022    1:50 PM 12/02/2021    3:33 PM  Depression screen PHQ 2/9  Decreased Interest 1 1 1 1 1   Down, Depressed, Hopeless 1 1 1 1 1   PHQ - 2 Score 2 2 2 2 2   Altered sleeping 1 1 0 0 1  Tired, decreased energy 1 1 0 0 2  Change in appetite 0 0 0 0 0  Feeling bad or failure about yourself  0 1 1 0 0  Trouble concentrating 0 0 0 0 1  Moving slowly or fidgety/restless 0 0 0 0 0  Suicidal thoughts 0 0 0 0 0  PHQ-9 Score 4 5 3 2 6   Difficult doing work/chores  Somewhat difficult Not difficult at all Somewhat difficult Somewhat difficult    Relevant past medical, surgical, family and social history reviewed and updated as indicated. Interim medical history since our last visit reviewed. Allergies and medications reviewed and updated.  Review of Systems  Constitutional: Negative.   Respiratory: Negative.     Cardiovascular: Negative.   Musculoskeletal: Negative.   Neurological: Negative.   Psychiatric/Behavioral: Negative.      Per HPI unless specifically indicated above     Objective:    BP 111/69 (BP Location: Left Arm, Patient Position: Sitting, Cuff Size: Large)   Pulse 73   Ht 5\' 8"  (1.727 m)   Wt 186 lb 3.2 oz (84.5 kg)   LMP  (LMP Unknown)   SpO2 93%   BMI 28.31 kg/m   Wt Readings from Last 3 Encounters:  12/20/23 186 lb 3.2 oz (84.5 kg)  09/09/23 180 lb (81.6 kg)  08/03/23 183 lb (83 kg)    Physical Exam Vitals and nursing note reviewed.  Constitutional:      General: She is not in acute distress.    Appearance: Normal appearance. She is not ill-appearing, toxic-appearing or diaphoretic.  HENT:     Head: Normocephalic and atraumatic.     Right Ear: External ear normal.     Left Ear: External ear normal.     Nose: Nose normal.     Mouth/Throat:     Mouth: Mucous membranes are moist.     Pharynx: Oropharynx is clear.  Eyes:     General: No scleral icterus.       Right eye: No discharge.  Left eye: No discharge.     Extraocular Movements: Extraocular movements intact.     Conjunctiva/sclera: Conjunctivae normal.     Pupils: Pupils are equal, round, and reactive to light.  Cardiovascular:     Rate and Rhythm: Normal rate and regular rhythm.     Pulses: Normal pulses.     Heart sounds: Normal heart sounds. No murmur heard.    No friction rub. No gallop.  Pulmonary:     Effort: Pulmonary effort is normal. No respiratory distress.     Breath sounds: Normal breath sounds. No stridor. No wheezing, rhonchi or rales.  Chest:     Chest wall: No tenderness.  Musculoskeletal:        General: Normal range of motion.     Cervical back: Normal range of motion and neck supple.  Skin:    General: Skin is warm and dry.     Capillary Refill: Capillary refill takes less than 2 seconds.     Coloration: Skin is not jaundiced or pale.     Findings: No bruising,  erythema, lesion or rash.  Neurological:     General: No focal deficit present.     Mental Status: She is alert and oriented to person, place, and time. Mental status is at baseline.  Psychiatric:        Mood and Affect: Mood normal.        Behavior: Behavior normal.        Thought Content: Thought content normal.        Judgment: Judgment normal.     Results for orders placed or performed in visit on 06/22/23  Microscopic Examination   Collection Time: 06/22/23  2:09 PM   Urine  Result Value Ref Range   WBC, UA 0-5 0 - 5 /hpf   RBC, Urine 0-2 0 - 2 /hpf   Epithelial Cells (non renal) 0-10 0 - 10 /hpf   Bacteria, UA None seen None seen/Few  Urinalysis, Routine w reflex microscopic   Collection Time: 06/22/23  2:09 PM  Result Value Ref Range   Specific Gravity, UA 1.015 1.005 - 1.030   pH, UA 6.5 5.0 - 7.5   Color, UA Yellow Yellow   Appearance Ur Clear Clear   Leukocytes,UA Negative Negative   Protein,UA Negative Negative/Trace   Glucose, UA Negative Negative   Ketones, UA Negative Negative   RBC, UA Trace (A) Negative   Bilirubin, UA Negative Negative   Urobilinogen, Ur 0.2 0.2 - 1.0 mg/dL   Nitrite, UA Negative Negative   Microscopic Examination See below:   Microalbumin, Urine Waived   Collection Time: 06/22/23  2:09 PM  Result Value Ref Range   Microalb, Ur Waived 10 0 - 19 mg/L   Creatinine, Urine Waived 50 10 - 300 mg/dL   Microalb/Creat Ratio <30 <30 mg/g  Cytology - PAP   Collection Time: 06/22/23  2:09 PM  Result Value Ref Range   High risk HPV Negative    Adequacy      Satisfactory for evaluation; transformation zone component PRESENT.   Diagnosis      - Negative for intraepithelial lesion or malignancy (NILM)   Comment Normal Reference Range HPV - Negative   CBC with Differential/Platelet   Collection Time: 06/22/23  2:13 PM  Result Value Ref Range   WBC 9.1 3.4 - 10.8 x10E3/uL   RBC 4.97 3.77 - 5.28 x10E6/uL   Hemoglobin 15.1 11.1 - 15.9 g/dL    Hematocrit 32.4 40.1 - 46.6 %  MCV 91 79 - 97 fL   MCH 30.4 26.6 - 33.0 pg   MCHC 33.3 31.5 - 35.7 g/dL   RDW 09.8 (L) 11.9 - 14.7 %   Platelets 386 150 - 450 x10E3/uL   Neutrophils 56 Not Estab. %   Lymphs 31 Not Estab. %   Monocytes 9 Not Estab. %   Eos 3 Not Estab. %   Basos 1 Not Estab. %   Neutrophils Absolute 5.1 1.4 - 7.0 x10E3/uL   Lymphocytes Absolute 2.8 0.7 - 3.1 x10E3/uL   Monocytes Absolute 0.8 0.1 - 0.9 x10E3/uL   EOS (ABSOLUTE) 0.3 0.0 - 0.4 x10E3/uL   Basophils Absolute 0.1 0.0 - 0.2 x10E3/uL   Immature Granulocytes 0 Not Estab. %   Immature Grans (Abs) 0.0 0.0 - 0.1 x10E3/uL  Comprehensive metabolic panel   Collection Time: 06/22/23  2:13 PM  Result Value Ref Range   Glucose 85 70 - 99 mg/dL   BUN 10 8 - 27 mg/dL   Creatinine, Ser 8.29 0.57 - 1.00 mg/dL   eGFR 73 >56 OZ/HYQ/6.57   BUN/Creatinine Ratio 11 (L) 12 - 28   Sodium 136 134 - 144 mmol/L   Potassium 4.7 3.5 - 5.2 mmol/L   Chloride 98 96 - 106 mmol/L   CO2 25 20 - 29 mmol/L   Calcium 9.9 8.7 - 10.3 mg/dL   Total Protein 6.8 6.0 - 8.5 g/dL   Albumin 4.7 3.9 - 4.9 g/dL   Globulin, Total 2.1 1.5 - 4.5 g/dL   Bilirubin Total 0.7 0.0 - 1.2 mg/dL   Alkaline Phosphatase 99 44 - 121 IU/L   AST 17 0 - 40 IU/L   ALT 14 0 - 32 IU/L  Lipid Panel w/o Chol/HDL Ratio   Collection Time: 06/22/23  2:13 PM  Result Value Ref Range   Cholesterol, Total 261 (H) 100 - 199 mg/dL   Triglycerides 846 (H) 0 - 149 mg/dL   HDL 39 (L) >96 mg/dL   VLDL Cholesterol Cal 54 (H) 5 - 40 mg/dL   LDL Chol Calc (NIH) 295 (H) 0 - 99 mg/dL  TSH   Collection Time: 06/22/23  2:13 PM  Result Value Ref Range   TSH 1.580 0.450 - 4.500 uIU/mL      Assessment & Plan:   Problem List Items Addressed This Visit       Other   Major depression in full remission (HCC)   Doing great. Insurance doesn't want to cover he 60mg . Will try 20 and 40- if not she'd like to pay out of pocket for the 20mg . Discussed changing to lexapro, but she's  really stable so would like to remain on current regimen. Call with any concerns.       Relevant Medications   citalopram (CELEXA) 40 MG tablet   citalopram (CELEXA) 20 MG tablet   LORazepam (ATIVAN) 0.5 MG tablet     Follow up plan: Return in about 6 months (around 06/21/2024) for physical.

## 2023-12-20 NOTE — Assessment & Plan Note (Signed)
 Doing great. Insurance doesn't want to cover he 60mg . Will try 20 and 40- if not she'd like to pay out of pocket for the 20mg . Discussed changing to lexapro, but she's really stable so would like to remain on current regimen. Call with any concerns.

## 2023-12-23 ENCOUNTER — Ambulatory Visit
Admission: RE | Admit: 2023-12-23 | Discharge: 2023-12-23 | Disposition: A | Discharge status: Home / Self Care | Payer: Medicare HMO | Source: Ambulatory Visit | Attending: Family Medicine | Admitting: Family Medicine

## 2023-12-23 DIAGNOSIS — Z1231 Encounter for screening mammogram for malignant neoplasm of breast: Secondary | ICD-10-CM | POA: Diagnosis not present

## 2023-12-23 DIAGNOSIS — Z13820 Encounter for screening for osteoporosis: Secondary | ICD-10-CM | POA: Insufficient documentation

## 2023-12-23 DIAGNOSIS — M85852 Other specified disorders of bone density and structure, left thigh: Secondary | ICD-10-CM | POA: Insufficient documentation

## 2023-12-23 NOTE — Telephone Encounter (Signed)
 Contacted patient and informed her that Dr. Laural Benes is showing as an in network provider with [employer].  Patient acknowledged understanding.

## 2023-12-24 ENCOUNTER — Encounter: Payer: Self-pay | Admitting: Family Medicine

## 2023-12-27 ENCOUNTER — Encounter: Payer: Self-pay | Admitting: Family Medicine

## 2024-04-05 ENCOUNTER — Other Ambulatory Visit: Payer: Self-pay

## 2024-04-05 ENCOUNTER — Other Ambulatory Visit: Payer: Self-pay | Admitting: Family Medicine

## 2024-04-06 ENCOUNTER — Other Ambulatory Visit: Payer: Self-pay

## 2024-04-06 DIAGNOSIS — D225 Melanocytic nevi of trunk: Secondary | ICD-10-CM | POA: Diagnosis not present

## 2024-04-06 DIAGNOSIS — D2261 Melanocytic nevi of right upper limb, including shoulder: Secondary | ICD-10-CM | POA: Diagnosis not present

## 2024-04-06 DIAGNOSIS — D2262 Melanocytic nevi of left upper limb, including shoulder: Secondary | ICD-10-CM | POA: Diagnosis not present

## 2024-04-06 DIAGNOSIS — L570 Actinic keratosis: Secondary | ICD-10-CM | POA: Diagnosis not present

## 2024-04-06 DIAGNOSIS — D2272 Melanocytic nevi of left lower limb, including hip: Secondary | ICD-10-CM | POA: Diagnosis not present

## 2024-04-10 ENCOUNTER — Other Ambulatory Visit: Payer: Self-pay

## 2024-04-11 ENCOUNTER — Other Ambulatory Visit: Payer: Self-pay

## 2024-04-13 ENCOUNTER — Other Ambulatory Visit: Payer: Self-pay

## 2024-04-17 ENCOUNTER — Other Ambulatory Visit: Payer: Self-pay

## 2024-04-18 ENCOUNTER — Other Ambulatory Visit: Payer: Self-pay

## 2024-04-18 MED ORDER — SUMATRIPTAN SUCCINATE 100 MG PO TABS
100.0000 mg | ORAL_TABLET | ORAL | 12 refills | Status: AC | PRN
  Filled 2024-04-18: qty 9, 30d supply, fill #0
  Filled 2024-05-31: qty 9, 30d supply, fill #1
  Filled 2024-07-04: qty 9, 30d supply, fill #2
  Filled 2024-09-20: qty 9, 30d supply, fill #3

## 2024-05-22 ENCOUNTER — Telehealth: Payer: Self-pay | Admitting: Family Medicine

## 2024-05-22 NOTE — Telephone Encounter (Signed)
 Called patient to reschedule annual physical, was not able to leave a message.

## 2024-06-06 DIAGNOSIS — K080 Exfoliation of teeth due to systemic causes: Secondary | ICD-10-CM | POA: Diagnosis not present

## 2024-06-12 DIAGNOSIS — H2513 Age-related nuclear cataract, bilateral: Secondary | ICD-10-CM | POA: Diagnosis not present

## 2024-06-12 DIAGNOSIS — H43813 Vitreous degeneration, bilateral: Secondary | ICD-10-CM | POA: Diagnosis not present

## 2024-06-13 ENCOUNTER — Other Ambulatory Visit: Payer: Self-pay

## 2024-06-13 DIAGNOSIS — M19011 Primary osteoarthritis, right shoulder: Secondary | ICD-10-CM | POA: Diagnosis not present

## 2024-06-13 MED ORDER — PREDNISONE 10 MG PO TABS
ORAL_TABLET | ORAL | 0 refills | Status: AC
  Filled 2024-06-13: qty 48, 12d supply, fill #0

## 2024-06-16 ENCOUNTER — Other Ambulatory Visit: Payer: Self-pay

## 2024-06-16 MED ORDER — MELOXICAM 7.5 MG PO TABS
7.5000 mg | ORAL_TABLET | Freq: Two times a day (BID) | ORAL | 1 refills | Status: AC
  Filled 2024-06-16: qty 30, 15d supply, fill #0
  Filled 2024-07-04: qty 30, 15d supply, fill #1

## 2024-06-26 ENCOUNTER — Encounter: Admitting: Family Medicine

## 2024-07-04 ENCOUNTER — Other Ambulatory Visit: Payer: Self-pay

## 2024-07-04 ENCOUNTER — Other Ambulatory Visit: Payer: Self-pay | Admitting: Family Medicine

## 2024-07-05 ENCOUNTER — Other Ambulatory Visit: Payer: Self-pay

## 2024-07-06 ENCOUNTER — Other Ambulatory Visit: Payer: Self-pay

## 2024-07-06 MED ORDER — CITALOPRAM HYDROBROMIDE 20 MG PO TABS
20.0000 mg | ORAL_TABLET | Freq: Every day | ORAL | 0 refills | Status: DC
  Filled 2024-07-06: qty 90, 90d supply, fill #0

## 2024-07-06 NOTE — Telephone Encounter (Signed)
 Requested Prescriptions  Pending Prescriptions Disp Refills   citalopram  (CELEXA ) 20 MG tablet 90 tablet 0    Sig: Take 1 tablet (20 mg total) by mouth daily. To be taken with the 40mg  for 60mg  total     Psychiatry:  Antidepressants - SSRI Failed - 07/06/2024  8:44 AM      Failed - Valid encounter within last 6 months    Recent Outpatient Visits           6 months ago Major depressive disorder with single episode, in full remission   [place] [facility] Helper, Megan P, DO              Passed - Completed PHQ-2 or PHQ-9 in the last 360 days

## 2024-07-08 ENCOUNTER — Other Ambulatory Visit: Payer: Self-pay

## 2024-07-08 ENCOUNTER — Emergency Department
Admission: EM | Admit: 2024-07-08 | Discharge: 2024-07-08 | Disposition: A | Discharge status: Home / Self Care | Attending: Emergency Medicine | Admitting: Emergency Medicine

## 2024-07-08 DIAGNOSIS — I10 Essential (primary) hypertension: Secondary | ICD-10-CM | POA: Insufficient documentation

## 2024-07-08 DIAGNOSIS — R030 Elevated blood-pressure reading, without diagnosis of hypertension: Secondary | ICD-10-CM | POA: Diagnosis not present

## 2024-07-08 LAB — BASIC METABOLIC PANEL WITH GFR
Anion gap: 12 (ref 5–15)
BUN: 16 mg/dL (ref 8–23)
CO2: 22 mmol/L (ref 22–32)
Calcium: 9 mg/dL (ref 8.9–10.3)
Chloride: 105 mmol/L (ref 98–111)
Creatinine, Ser: 0.9 mg/dL (ref 0.44–1.00)
GFR, Estimated: >60 mL/min (ref >60)
Glucose, Bld: 96 mg/dL (ref 70–99)
Potassium: 4.3 mmol/L (ref 3.5–5.1)
Sodium: 139 mmol/L (ref 135–145)

## 2024-07-08 LAB — CBC
HCT: 42.1 % (ref 36.0–46.0)
Hemoglobin: 14.7 g/dL (ref 12.0–15.0)
MCH: 30.6 pg (ref 26.0–34.0)
MCHC: 34.9 g/dL (ref 30.0–36.0)
MCV: 87.7 fL (ref 80.0–100.0)
Platelets: 348 K/uL (ref 150–400)
RBC: 4.8 MIL/uL (ref 3.87–5.11)
RDW: 11.9 % (ref 11.5–15.5)
WBC: 8.1 K/uL (ref 4.0–10.5)
nRBC: 0 % (ref 0.0–0.2)

## 2024-07-08 NOTE — ED Provider Notes (Signed)
 [facility] Provider Note    Event Date/Time   First MD Initiated Contact with Patient 07/08/24 [phone]     (approximate)   History   Hypertension   HPI  Debra Cannon is a 68 y.o. female with a history of hypercholesterolemia who presents with concerns of elevated blood pressure.  Patient reports she has not been feeling well most of this week, she thought she might have COVID but test were negative, likely viral illness.  Had mild headache last night and checked her blood pressure and found it to be elevated at 160 systolic which concerned her.  Today it was about the same but no further headache, no neurodeficits.  No chest pain.  No abdominal pain     Physical Exam   Triage Vital Signs: ED Triage Vitals  Encounter Vitals Group     BP 07/08/24 0930 (!) 163/80     Girls Systolic BP Percentile --      Girls Diastolic BP Percentile --      Boys Systolic BP Percentile --      Boys Diastolic BP Percentile --      Pulse Rate 07/08/24 0930 69     Resp 07/08/24 0930 16     Temp 07/08/24 0930 98.5 F (36.9 C)     Temp Source 07/08/24 0930 Oral     SpO2 07/08/24 0930 100 %     Weight 07/08/24 0926 81.6 kg (180 lb)     Height 07/08/24 0926 1.727 m (5' 8)     Head Circumference --      Peak Flow --      Pain Score 07/08/24 0926 0     Pain Loc --      Pain Education --      Exclude from Growth Chart --     Most recent vital signs: Vitals:   07/08/24 0932 07/08/24 0952  BP: (!) 162/80 137/73  Pulse:  66  Resp:  18  Temp:  98.2 F (36.8 C)  SpO2:  99%     General: Awake, no distress.  CV:  Good peripheral perfusion.  Regular rate and rhythm, no murmur Resp:  Normal effort.  Abd:  No distention.  Other:  Equal pulses in the upper extremity   ED Results / Procedures / Treatments   Labs (all labs ordered are listed, but only abnormal results are displayed) Labs Reviewed  CBC  BASIC METABOLIC PANEL WITH GFR      EKG     RADIOLOGY     PROCEDURES:  Critical Care performed:   Procedures   MEDICATIONS ORDERED IN ED: Medications - No data to display   IMPRESSION / MDM / ASSESSMENT AND PLAN / ED COURSE  I reviewed the triage vital signs and the nursing notes. Patient's presentation is most consistent with acute illness / injury with system symptoms.  Patient presents with reports of hypertension, blood pressure is improved here, 137 systolic in both arms.  Differential includes development of primary hypertension, hypertension related to illness.  Overall quite well-appearing and in no acute distress.  Will check basic labs, if normal appropriate for discharge with follow-up with PCP  Lab work is reassuring, patient remains well-appearing, appropriate discharge with PCP follow-up, no indication for admission or further testing at this time      FINAL CLINICAL IMPRESSION(S) / ED DIAGNOSES   Final diagnoses:  Hypertension, unspecified type     Rx / DC Orders   ED Discharge Orders  None        Note:  This document was prepared using Dragon voice recognition software and may include unintentional dictation errors.   Arlander Charleston, MD 07/08/24 1027

## 2024-07-08 NOTE — ED Triage Notes (Signed)
 Pt to ED for high BP and HA last night. BP was 190/60 last night. This AM was 160/103. Does not have HA right now but last night had HA that felt like pressure to whole head and neck. No hx HTN. Does have hx migraines. Took Imatrex last night. Did negative home Covid test about 5 and 3 days ago. States has been kind of nauseous all week especially in mornings and her guts have felt weird but no abdominal pain at this time. Denies CP, dizziness. Denies vision changes. BP is 163/80.

## 2024-07-11 ENCOUNTER — Ambulatory Visit: Admitting: Emergency Medicine

## 2024-07-11 VITALS — Ht 68.0 in | Wt 180.0 lb

## 2024-07-11 DIAGNOSIS — Z1211 Encounter for screening for malignant neoplasm of colon: Secondary | ICD-10-CM

## 2024-07-11 DIAGNOSIS — Z0000 Encounter for general adult medical examination without abnormal findings: Secondary | ICD-10-CM | POA: Diagnosis not present

## 2024-07-11 NOTE — Progress Notes (Signed)
 Subjective:   Debra Cannon is a 68 y.o. who presents for a Medicare Wellness preventive visit.  As a reminder, Annual Wellness Visits don't include a physical exam, and some assessments may be limited, especially if this visit is performed virtually. We may recommend an in-person follow-up visit with your provider if needed.  Visit Complete: Virtual I connected with  Debra Cannon on 07/11/24 by a video and audio enabled telemedicine application and verified that I am speaking with the correct person using two identifiers.  Patient Location: Home  Provider Location: Home Office  I discussed the limitations of evaluation and management by telemedicine. The patient expressed understanding and agreed to proceed.  Vital Signs: Because this visit was a virtual/telehealth visit, some criteria may be missing or patient reported. Any vitals not documented were not able to be obtained and vitals that have been documented are patient reported.    Persons Participating in Visit: Patient.  AWV Questionnaire: No: Patient Medicare AWV questionnaire was not completed prior to this visit.  Cardiac Risk Factors include: advanced age (>12men, >51 women);dyslipidemia     Objective:    Today's Vitals   07/11/24 1002  Weight: 180 lb (81.6 kg)  Height: 5' 8 (1.727 m)   Body mass index is 27.37 kg/m.     07/11/2024   10:13 AM 07/08/2024    9:27 AM 09/09/2023    1:32 PM 01/17/2020   12:32 PM 06/23/2019    7:50 AM  Advanced Directives  Does Patient Have a Medical Advance Directive? Yes No No No No  Type of Estate agent of [place];Living will      Does patient want to make changes to medical advance directive? No - Patient declined      Copy of Healthcare Power of Attorney in Chart? No - copy requested        Current Medications (verified) Outpatient Encounter Medications as of 07/11/2024  Medication Sig   acyclovir  (ZOVIRAX ) 400 MG tablet Take 1 tablet (400 mg total)  by mouth 3 (three) times daily. (Patient taking differently: Take 400 mg by mouth 3 (three) times daily. PRN)   B Complex Vitamins (VITAMIN B COMPLEX) TABS Take 1 tablet by mouth daily.   benzonatate  (TESSALON ) 100 MG capsule Take 2 capsules (200 mg total) by mouth every 8 (eight) hours. (Patient taking differently: Take 200 mg by mouth every 8 (eight) hours. PRN)   Cholecalciferol (VITAMIN D  PO) Take 2,000 Units by mouth daily.    citalopram  (CELEXA ) 20 MG tablet Take 1 tablet (20 mg total) by mouth daily. To be taken with the 40mg  for 60mg  total   citalopram  (CELEXA ) 40 MG tablet Take 1 tablet (40 mg total) by mouth daily.   cyclobenzaprine  (FLEXERIL ) 10 MG tablet Take 1 tablet (10 mg total) by mouth at bedtime. (Patient taking differently: Take 10 mg by mouth at bedtime. PRN)   fluticasone  (FLONASE ) 50 MCG/ACT nasal spray Spray 2 sprays into each nostril once daily.   LORazepam  (ATIVAN ) 0.5 MG tablet Take 1 tablet (0.5 mg total) by mouth 2 (two) times daily as needed for anxiety.   meloxicam  (MOBIC ) 7.5 MG tablet Take 1 tablet (7.5 mg total) by mouth 2 (two) times daily with a meal. (Patient taking differently: Take 7.5 mg by mouth 2 (two) times daily with a meal. PRN)   omeprazole (PRILOSEC) 20 MG capsule Take 20 mg by mouth daily as needed.    ondansetron  (ZOFRAN -ODT) 4 MG disintegrating tablet Take 1  tablet (4 mg total) by mouth every 8 (eight) hours as needed.   SUMAtriptan  (IMITREX ) 100 MG tablet TAKE 1 TABLET BY MOUTH AS NEEDED. MAY REPEAT IN 2 HOURS IF HEADACHE PERSISTS OR RECURS.   Facility-Administered Encounter Medications as of 07/11/2024  Medication   aspirin  EC tablet 324 mg    Allergies (verified) Patient has no known allergies.   History: Past Medical History:  Diagnosis Date   Anxiety    sometimes last 6 months most recently   Depression    1 1/2 years ago most recent   Herpes    Hypercholesteremia    Menopause    Migraine    Past Surgical History:  Procedure  Laterality Date   APPENDECTOMY     BREAST EXCISIONAL BIOPSY Right 2019   lipoma   COLONOSCOPY  09/03/2008   Dr Viktoria   COLONOSCOPY WITH PROPOFOL  N/A 06/23/2019   Procedure: COLONOSCOPY WITH PROPOFOL ;  Surgeon: Therisa Bi, MD;  Location: [facility] ENDOSCOPY;  Service: Gastroenterology;  Laterality: N/A;   laporoscopy     REPAIR ANKLE LIGAMENT Left 2004 & 2005   UPPER GI ENDOSCOPY  2009   Dr Viktoria   Family History  Problem Relation Age of Onset   Osteoporosis Mother    Thyroid disease Mother    Heart disease Father    Heart attack Father 24   Cancer Sister        endometrial   Cardiomyopathy Brother    Cancer Maternal Grandmother        vulvar   Diabetes Maternal Grandmother    AAA (abdominal aortic aneurysm) Maternal Grandfather    Stroke Paternal Grandmother    Heart disease Paternal Grandfather    Breast cancer Neg Hx    Social History   Socioeconomic History   Marital status: Married    Spouse name: Oneil   Number of children: 0   Years of education: Not on file   Highest education level: Bachelor's degree (e.g., BA, AB, BS)  Occupational History   Occupation: retired  Tobacco Use   Smoking status: Former    Current packs/day: 0.00    Average packs/day: 1 pack/day for 20.0 years (20.0 ttl pk-yrs)    Types: Cigarettes    Start date: 37    Quit date: 2000    Years since quitting: 25.7   Smokeless tobacco: Never  Vaping Use   Vaping status: Never Used  Substance and Sexual Activity   Alcohol use: Not Currently    Alcohol/week: 0.0 standard drinks of alcohol    Comment: 2 drinks per year   Drug use: No   Sexual activity: Yes  Other Topics Concern   Not on file  Social History Narrative   07/11/24 has 2 step-children and 5 grandchildren/pbt   Social Drivers of Corporate investment banker Strain: Low Risk  (07/11/2024)   Overall Financial Resource Strain (CARDIA)    Difficulty of Paying Living Expenses: Not hard at all  Food Insecurity: No Food Insecurity  (07/11/2024)   Hunger Vital Sign    Worried About Running Out of Food in the Last Year: Never true    Ran Out of Food in the Last Year: Never true  Transportation Needs: No Transportation Needs (07/11/2024)   PRAPARE - Administrator, Civil Service (Medical): No    Lack of Transportation (Non-Medical): No  Physical Activity: Sufficiently Active (07/11/2024)   Exercise Vital Sign    Days of Exercise per Week: 5 days  Minutes of Exercise per Session: 40 min  Stress: No Stress Concern Present (07/11/2024)   [employer] of Occupational Health - Occupational Stress Questionnaire    Feeling of Stress: Not at all  Social Connections: Moderately Isolated (07/11/2024)   Social Connection and Isolation Panel    Frequency of Communication with Friends and Family: More than three times a week    Frequency of Social Gatherings with Friends and Family: More than three times a week    Attends Religious Services: Never    Database administrator or Organizations: No    Attends Engineer, structural: Never    Marital Status: Married    Tobacco Counseling Counseling given: Not Answered    Clinical Intake:  Pre-visit preparation completed: Yes  Pain : No/denies pain     BMI - recorded: 27.37 Nutritional Status: BMI 25 -29 Overweight Nutritional Risks: Nausea/ vomitting/ diarrhea (mild nausea x 2 weeks) Diabetes: No  No results found for: HGBA1C   How often do you need to have someone help you when you read instructions, pamphlets, or other written materials from your doctor or pharmacy?: 1 - Never  Interpreter Needed?: No  Information entered by :: Vina Ned, CMA   Activities of Daily Living     07/11/2024   10:04 AM  In your present state of health, do you have any difficulty performing the following activities:  Hearing? 1  Comment wears hearing aids  Vision? 0  Difficulty concentrating or making decisions? 0  Walking or climbing stairs? 0   Dressing or bathing? 0  Doing errands, shopping? 0  Preparing Food and eating ? N  Using the Toilet? N  In the past six months, have you accidently leaked urine? N  Do you have problems with loss of bowel control? N  Managing your Medications? N  Managing your Finances? N  Housekeeping or managing your Housekeeping? N    Patient Care Team: Vicci Duwaine SQUIBB, DO as PCP - General (Family Medicine) Jaye Fallow, MD as Referring Physician (Ophthalmology) Therisa Bi, MD as Consulting Physician (Gastroenterology) Marchia Drivers, MD as Consulting Physician (Orthopedic Surgery) Dasher, Alm LABOR, MD (Dermatology) Clois Fret, MD as Consulting Physician (Neurosurgery) Norlene Sharps (Audiology)  I have updated your Care Teams any recent Medical Services you may have received from other providers in the past year.     Assessment:   This is a routine wellness examination for Zoii.  Hearing/Vision screen Hearing Screening - Comments:: Wears hearing aids Vision Screening - Comments:: Gets routine eye exams, Dr. Jaye Jacobs Hoffman   Goals Addressed               This Visit's Progress     DIET - INCREASE WATER INTAKE (pt-stated)         Depression Screen     07/11/2024   10:11 AM 12/20/2023    1:31 PM 06/22/2023    1:36 PM 12/04/2022    1:53 PM 06/05/2022    1:50 PM 12/02/2021    3:33 PM 06/03/2021    3:28 PM  PHQ 2/9 Scores  PHQ - 2 Score 0 2 2 2 2 2  0  PHQ- 9 Score 0 4 5 3 2 6  0    Fall Risk     07/11/2024   10:14 AM 06/22/2023    1:36 PM 06/03/2021    3:28 PM 05/30/2020    3:25 PM 05/25/2019    3:53 PM  Fall Risk   Falls in the past year?  0 0 0 0 1   Number falls in past yr: 0 0 0 0 0   Injury with Fall? 0 0 0 0 0  Risk for fall due to : No Fall Risks No Fall Risks No Fall Risks    Follow up Falls evaluation completed Falls evaluation completed Falls evaluation completed        Data saved with a previous flowsheet row definition    MEDICARE RISK AT  HOME:  Medicare Risk at Home Any stairs in or around the home?: Yes If so, are there any without handrails?: No Home free of loose throw rugs in walkways, pet beds, electrical cords, etc?: Yes Adequate lighting in your home to reduce risk of falls?: Yes Life alert?: No Use of a cane, walker or w/c?: No Grab bars in the bathroom?: Yes Shower chair or bench in shower?: No Elevated toilet seat or a handicapped toilet?: Yes  TIMED UP AND GO:  Was the test performed?  No  Cognitive Function: 6CIT completed        07/11/2024   10:15 AM 06/22/2023    1:29 PM  6CIT Screen  What Year? 0 points 0 points  What month? 0 points 0 points  What time? 0 points 0 points  Count back from 20 0 points 0 points  Months in reverse 0 points 0 points  Repeat phrase 0 points 0 points  Total Score 0 points 0 points    Immunizations Immunization History  Administered Date(s) Administered   Fluad Trivalent(High Dose 65+) 06/22/2023   Influenza,inj,Quad PF,6+ Mos 05/25/2019, 06/05/2022   Influenza-Unspecified 07/06/2016, 07/19/2018, 07/15/2020, 07/05/2021   PFIZER(Purple Top)SARS-COV-2 Vaccination 09/16/2019, 10/17/2019, 06/06/2020, 01/31/2021   PNEUMOCOCCAL CONJUGATE-20 06/22/2023   Pfizer(Comirnaty)Fall Seasonal Vaccine 12 years and older 07/06/2023   Td 10/05/2005   Tdap 10/05/2014   Zoster Recombinant(Shingrix) 06/03/2021, 09/09/2021    Screening Tests Health Maintenance  Topic Date Due   Influenza Vaccine  05/05/2024   COVID-19 Vaccine (6 - 2024-25 season) 06/05/2024   Colonoscopy  06/22/2024   DTaP/Tdap/Td (3 - Td or Tdap) 10/05/2024   Mammogram  12/22/2024   Medicare Annual Wellness (AWV)  07/11/2025   DEXA SCAN  12/22/2028   Pneumococcal Vaccine: 50+ Years  Completed   Hepatitis C Screening  Completed   Zoster Vaccines- Shingrix  Completed   Meningococcal B Vaccine  Aged Out    Health Maintenance Items Addressed: Vaccines Due: Flu and Covid vaccines, Referral sent to GI for  colonoscopy, See Nurse Notes at the end of this note  Additional Screening:  Vision Screening: Recommended annual ophthalmology exams for early detection of glaucoma and other disorders of the eye. Is the patient up to date with their annual eye exam?  Yes  Who is the provider or what is the name of the office in which the patient attends annual eye exams? Dr. Jaye @ [facility] [place]  Dental Screening: Recommended annual dental exams for proper oral hygiene  Community Resource Referral / Chronic Care Management: CRR required this visit?  No   CCM required this visit?  No   Plan:    I have personally reviewed and noted the following in the patient's chart:   Medical and social history Use of alcohol, tobacco or illicit drugs  Current medications and supplements including opioid prescriptions. Patient is not currently taking opioid prescriptions. Functional ability and status Nutritional status Physical activity Advanced directives List of other physicians Hospitalizations, surgeries, and ER visits in previous 12 months Vitals  Screenings to include cognitive, depression, and falls Referrals and appointments  In addition, I have reviewed and discussed with patient certain preventive protocols, quality metrics, and best practice recommendations. A written personalized care plan for preventive services as well as general preventive health recommendations were provided to patient.   Vina Ned, CMA   07/11/2024   After Visit Summary: (MyChart) Due to this being a telephonic visit, the after visit summary with patients personalized plan was offered to patient via MyChart   Notes:  Plans to get flu and covid vaccines at next OV on 07/06/24 Placed referral to GI for colonoscopy (due 06/2024)

## 2024-07-11 NOTE — Patient Instructions (Addendum)
 Debra Cannon,  Thank you for taking the time for your Medicare Wellness Visit. I appreciate your continued commitment to your health goals. Please review the care plan we discussed, and feel free to reach out if I can assist you further.  Medicare recommends these wellness visits once per year to help you and your care team stay ahead of potential health issues. These visits are designed to focus on prevention, allowing your provider to concentrate on managing your acute and chronic conditions during your regular appointments.  Please note that Annual Wellness Visits do not include a physical exam. Some assessments may be limited, especially if the visit was conducted virtually. If needed, we may recommend a separate in-person follow-up with your provider.  Ongoing Care Seeing your primary care provider every 3 to 6 months helps us  monitor your health and provide consistent, personalized care.    Referrals If a referral was made during today's visit and you haven't received any updates within two weeks, please contact the referred provider directly to check on the status. I have placed a referral to Dr. Ruel Kung at [facility] to schedule a colonoscopy. Their ph# is [phone].    Recommended Screenings: Get the flu and covid vaccines at your next OV on 08/01/24. Keep up the good work!  Health Maintenance  Topic Date Due   Flu Shot  05/05/2024   COVID-19 Vaccine (6 - 2024-25 season) 06/05/2024   Colon Cancer Screening  06/22/2024   DTaP/Tdap/Td vaccine (3 - Td or Tdap) 10/05/2024   Breast Cancer Screening  12/22/2024   Medicare Annual Wellness Visit  07/11/2025   DEXA scan (bone density measurement)  12/22/2028   Pneumococcal Vaccine for age over 63  Completed   Hepatitis C Screening  Completed   Zoster (Shingles) Vaccine  Completed   Meningitis B Vaccine  Aged Out       07/11/2024   10:13 AM  Advanced Directives  Does Patient Have a Medical Advance Directive? Yes  Type  of Estate agent of [place];Living will  Does patient want to make changes to medical advance directive? No - Patient declined  Copy of Healthcare Power of Attorney in Chart? No - copy requested   Advance Care Planning is important because it: Ensures you receive medical care that aligns with your values, goals, and preferences. Provides guidance to your family and loved ones, reducing the emotional burden of decision-making during critical moments.  Vision: Annual vision screenings are recommended for early detection of glaucoma, cataracts, and diabetic retinopathy. These exams can also reveal signs of chronic conditions such as diabetes and high blood pressure.  Dental: Annual dental screenings help detect early signs of oral cancer, gum disease, and other conditions linked to overall health, including heart disease and diabetes.  Please see the attached documents for additional preventive care recommendations.    Fall Prevention in the Home, Adult Falls can cause injuries and affect people of all ages. There are many simple things that you can do to make your home safe and to help prevent falls. If you need it, ask for help making these changes. What actions can I take to prevent falls? General information Use good lighting in all rooms. Make sure to: Replace any light bulbs that burn out. Turn on lights if it is dark and use night-lights. Keep items that you use often in easy-to-reach places. Lower the shelves around your home if needed. Move furniture so that there are clear paths around it. Do  not keep throw rugs or other things on the floor that can make you trip. If any of your floors are uneven, fix them. Add color or contrast paint or tape to clearly mark and help you see: Grab bars or handrails. First and last steps of staircases. Where the edge of each step is. If you use a ladder or stepladder: Make sure that it is fully opened. Do not climb a closed  ladder. Make sure the sides of the ladder are locked in place. Have someone hold the ladder while you use it. Know where your pets are as you move through your home. What can I do in the bathroom?     Keep the floor dry. Clean up any water that is on the floor right away. Remove soap buildup in the bathtub or shower. Buildup makes bathtubs and showers slippery. Use non-skid mats or decals on the floor of the bathtub or shower. Attach bath mats securely with double-sided, non-slip rug tape. If you need to sit down while you are in the shower, use a non-slip stool. Install grab bars by the toilet and in the bathtub and shower. Do not use towel bars as grab bars. What can I do in the bedroom? Make sure that you have a light by your bed that is easy to reach. Do not use any sheets or blankets on your bed that hang to the floor. Have a firm bench or chair with side arms that you can use for support when you get dressed. What can I do in the kitchen? Clean up any spills right away. If you need to reach something above you, use a sturdy step stool that has a grab bar. Keep electrical cables out of the way. Do not use floor polish or wax that makes floors slippery. What can I do with my stairs? Do not leave anything on the stairs. Make sure that you have a light switch at the top and the bottom of the stairs. Have them installed if you do not have them. Make sure that there are handrails on both sides of the stairs. Fix handrails that are broken or loose. Make sure that handrails are as long as the staircases. Install non-slip stair treads on all stairs in your home if they do not have carpet. Avoid having throw rugs at the top or bottom of stairs, or secure the rugs with carpet tape to prevent them from moving. Choose a carpet design that does not hide the edge of steps on the stairs. Make sure that carpet is firmly attached to the stairs. Fix any carpet that is loose or worn. What can I do on  the outside of my home? Use bright outdoor lighting. Repair the edges of walkways and driveways and fix any cracks. Clear paths of anything that can make you trip, such as tools or rocks. Add color or contrast paint or tape to clearly mark and help you see high doorway thresholds. Trim any bushes or trees on the main path into your home. Check that handrails are securely fastened and in good repair. Both sides of all steps should have handrails. Install guardrails along the edges of any raised decks or porches. Have leaves, snow, and ice cleared regularly. Use sand, salt, or ice melt on walkways during winter months if you live where there is ice and snow. In the garage, clean up any spills right away, including grease or oil spills. What other actions can I take? Review your medicines with  your health care provider. Some medicines can make you confused or feel dizzy. This can increase your chance of falling. Wear closed-toe shoes that fit well and support your feet. Wear shoes that have rubber soles and low heels. Use a cane, walker, scooter, or crutches that help you move around if needed. Talk with your provider about other ways that you can decrease your risk of falls. This may include seeing a physical therapist to learn to do exercises to improve movement and strength. Where to find more information Centers for Disease Control and Prevention, [employer]: TonerPromos.no [employer] on Aging: BaseRingTones.pl National Institute on Aging: BaseRingTones.pl Contact a health care provider if: You are afraid of falling at home. You feel weak, drowsy, or dizzy at home. You fall at home. Get help right away if you: Lose consciousness or have trouble moving after a fall. Have a fall that causes a head injury. These symptoms may be an emergency. Get help right away. Call 911. Do not wait to see if the symptoms will go away. Do not drive yourself to the hospital. This information is not intended to replace  advice given to you by your health care provider. Make sure you discuss any questions you have with your health care provider. Document Revised: 05/25/2022 Document Reviewed: 05/25/2022 Elsevier Patient Education  2024 [employer].

## 2024-08-01 ENCOUNTER — Other Ambulatory Visit: Payer: Self-pay

## 2024-08-01 ENCOUNTER — Ambulatory Visit (INDEPENDENT_AMBULATORY_CARE_PROVIDER_SITE_OTHER): Admitting: Family Medicine

## 2024-08-01 VITALS — BP 128/81 | HR 67 | Temp 97.9°F | Ht 68.0 in | Wt 180.2 lb

## 2024-08-01 DIAGNOSIS — Z0000 Encounter for general adult medical examination without abnormal findings: Secondary | ICD-10-CM | POA: Diagnosis not present

## 2024-08-01 DIAGNOSIS — Z23 Encounter for immunization: Secondary | ICD-10-CM

## 2024-08-01 DIAGNOSIS — E7800 Pure hypercholesterolemia, unspecified: Secondary | ICD-10-CM | POA: Diagnosis not present

## 2024-08-01 DIAGNOSIS — F325 Major depressive disorder, single episode, in full remission: Secondary | ICD-10-CM | POA: Diagnosis not present

## 2024-08-01 DIAGNOSIS — R222 Localized swelling, mass and lump, trunk: Secondary | ICD-10-CM | POA: Diagnosis not present

## 2024-08-01 MED ORDER — ACYCLOVIR 400 MG PO TABS
400.0000 mg | ORAL_TABLET | Freq: Three times a day (TID) | ORAL | 1 refills | Status: AC | PRN
  Filled 2024-08-01: qty 270, 90d supply, fill #0

## 2024-08-01 MED ORDER — CITALOPRAM HYDROBROMIDE 20 MG PO TABS
20.0000 mg | ORAL_TABLET | Freq: Every day | ORAL | 1 refills | Status: AC
  Filled 2024-08-01 – 2024-09-20 (×2): qty 90, 90d supply, fill #0

## 2024-08-01 MED ORDER — LORAZEPAM 0.5 MG PO TABS
0.5000 mg | ORAL_TABLET | Freq: Two times a day (BID) | ORAL | 0 refills | Status: AC | PRN
  Filled 2024-08-01: qty 30, 15d supply, fill #0

## 2024-08-01 MED ORDER — CITALOPRAM HYDROBROMIDE 40 MG PO TABS
40.0000 mg | ORAL_TABLET | Freq: Every day | ORAL | 1 refills | Status: AC
  Filled 2024-08-01 – 2024-09-20 (×2): qty 90, 90d supply, fill #0

## 2024-08-01 NOTE — Assessment & Plan Note (Signed)
 Under good control on current regimen. Continue current regimen. Continue to monitor. Call with any concerns. Refills given.

## 2024-08-01 NOTE — Assessment & Plan Note (Signed)
 Rechecking labs today. Await results. Treat as needed.

## 2024-08-01 NOTE — Progress Notes (Signed)
 BP 128/81   Pulse 67   Temp 97.9 F (36.6 C) (Oral)   Ht 5' 8 (1.727 m)   Wt 180 lb 3.2 oz (81.7 kg)   LMP  (LMP Unknown)   SpO2 99%   BMI 27.40 kg/m    Subjective:    Patient ID: Debra Cannon, female    DOB: [birth year], 68 y.o.   MRN: [id number]  HPI: Debra Cannon is a 68 y.o. female presenting on 08/01/2024 for comprehensive medical examination. Current medical complaints include:  LUMP Duration: months Location: upper L back Onset: gradual Painful: no Discomfort: no Status:  not changing Trauma: no Redness: no Bruising: no Recent infection: no Swollen lymph nodes: no Requesting removal: yes History of cancer: no Family history of cancer: no History of the same: yes Associated signs and symptoms: none  DEPRESSION Mood status: stable Satisfied with current treatment?: yes Symptom severity: mild  Duration of current treatment : chronic Side effects: no Medication compliance: excellent compliance Psychotherapy/counseling: yes in the past Previous psychiatric medications: celexa  Depressed mood: no Anxious mood: yes Anhedonia: no Significant weight loss or gain: no Insomnia: no  Fatigue: yes Feelings of worthlessness or guilt: no Impaired concentration/indecisiveness: no Suicidal ideations: no Hopelessness: no Crying spells: no    07/11/2024   10:11 AM 12/20/2023    1:31 PM 06/22/2023    1:36 PM 12/04/2022    1:53 PM 06/05/2022    1:50 PM  Depression screen PHQ 2/9  Decreased Interest 0 1 1 1 1   Down, Depressed, Hopeless 0 1 1 1 1   PHQ - 2 Score 0 2 2 2 2   Altered sleeping 0 1 1 0 0  Tired, decreased energy 0 1 1 0 0  Change in appetite 0 0 0 0 0  Feeling bad or failure about yourself  0 0 1 1 0  Trouble concentrating 0 0 0 0 0  Moving slowly or fidgety/restless 0 0 0 0 0  Suicidal thoughts 0 0 0 0 0  PHQ-9 Score 0 4 5 3 2   Difficult doing work/chores Not difficult at all  Somewhat difficult Not difficult at all Somewhat difficult    HYPERLIPIDEMIA Hyperlipidemia status: excellent compliance Satisfied with current treatment?  yes Side effects:  no Medication compliance: N/A Past cholesterol meds: none Supplements: none Aspirin :  no The 10-year ASCVD risk score (Arnett DK, et al., 2019) is: 8%   Values used to calculate the score:     Age: 55 years     Clincally relevant sex: Female     Is Non-Hispanic African American: No     Diabetic: No     Tobacco smoker: No     Systolic Blood Pressure: 128 mmHg     Is BP treated: No     HDL Cholesterol: 39 mg/dL     Total Cholesterol: 261 mg/dL Chest pain:  no Coronary artery disease:  no  She currently lives with: husband Menopausal Symptoms: no  Depression Screen done today and results listed below:     07/11/2024   10:11 AM 12/20/2023    1:31 PM 06/22/2023    1:36 PM 12/04/2022    1:53 PM 06/05/2022    1:50 PM  Depression screen PHQ 2/9  Decreased Interest 0 1 1 1 1   Down, Depressed, Hopeless 0 1 1 1 1   PHQ - 2 Score 0 2 2 2 2   Altered sleeping 0 1 1 0 0  Tired, decreased energy 0 1 1 0 0  Change in appetite 0 0 0 0 0  Feeling bad or failure about yourself  0 0 1 1 0  Trouble concentrating 0 0 0 0 0  Moving slowly or fidgety/restless 0 0 0 0 0  Suicidal thoughts 0 0 0 0 0  PHQ-9 Score 0 4 5 3 2   Difficult doing work/chores Not difficult at all  Somewhat difficult Not difficult at all Somewhat difficult    Past Medical History:  Past Medical History:  Diagnosis Date   Anxiety    sometimes last 6 months most recently   Depression    1 1/2 years ago most recent   Herpes    Hypercholesteremia    Hypertension    Menopause    Migraine     Surgical History:  Past Surgical History:  Procedure Laterality Date   APPENDECTOMY     BREAST EXCISIONAL BIOPSY Right 2019   lipoma   COLONOSCOPY  09/03/2008   Dr Viktoria   COLONOSCOPY WITH PROPOFOL  N/A 06/23/2019   Procedure: COLONOSCOPY WITH PROPOFOL ;  Surgeon: Therisa Bi, MD;  Location: [facility] ENDOSCOPY;   Service: Gastroenterology;  Laterality: N/A;   laporoscopy     REPAIR ANKLE LIGAMENT Left 2004 & 2005   UPPER GI ENDOSCOPY  2009   Dr Viktoria    Medications:  Current Outpatient Medications on File Prior to Visit  Medication Sig   B Complex Vitamins (VITAMIN B COMPLEX) TABS Take 1 tablet by mouth daily.   Cholecalciferol (VITAMIN D  PO) Take 2,000 Units by mouth daily.    fluticasone  (FLONASE ) 50 MCG/ACT nasal spray Spray 2 sprays into each nostril once daily.   meloxicam  (MOBIC ) 7.5 MG tablet Take 1 tablet (7.5 mg total) by mouth 2 (two) times daily with a meal. (Patient taking differently: Take 7.5 mg by mouth 2 (two) times daily with a meal. PRN)   omeprazole (PRILOSEC) 20 MG capsule Take 20 mg by mouth daily as needed.    ondansetron  (ZOFRAN -ODT) 4 MG disintegrating tablet Take 1 tablet (4 mg total) by mouth every 8 (eight) hours as needed.   SUMAtriptan  (IMITREX ) 100 MG tablet TAKE 1 TABLET BY MOUTH AS NEEDED. MAY REPEAT IN 2 HOURS IF HEADACHE PERSISTS OR RECURS.   Current Facility-Administered Medications on File Prior to Visit  Medication   aspirin  EC tablet 324 mg    Allergies:  No Known Allergies  Social History:  Social History   Socioeconomic History   Marital status: Married    Spouse name: Oneil   Number of children: 0   Years of education: Not on file   Highest education level: Bachelor's degree (e.g., BA, AB, BS)  Occupational History   Occupation: retired  Tobacco Use   Smoking status: Former    Current packs/day: 0.00    Average packs/day: 1 pack/day for 20.0 years (20.0 ttl pk-yrs)    Types: Cigarettes    Start date: 54    Quit date: 2000    Years since quitting: 25.8   Smokeless tobacco: Never  Vaping Use   Vaping status: Never Used  Substance and Sexual Activity   Alcohol use: Not Currently    Comment: 2 drinks per year   Drug use: No   Sexual activity: Yes    Birth control/protection: None  Other Topics Concern   Not on file  Social History  Narrative   07/11/24 has 2 step-children and 5 grandchildren/pbt   Social Drivers of Health   Financial Resource Strain: Low Risk  (08/01/2024)  Overall Financial Resource Strain (CARDIA)    Difficulty of Paying Living Expenses: Not hard at all  Food Insecurity: No Food Insecurity (08/01/2024)   Hunger Vital Sign    Worried About Running Out of Food in the Last Year: Never true    Ran Out of Food in the Last Year: Never true  Transportation Needs: No Transportation Needs (08/01/2024)   PRAPARE - Administrator, Civil Service (Medical): No    Lack of Transportation (Non-Medical): No  Physical Activity: Sufficiently Active (08/01/2024)   Exercise Vital Sign    Days of Exercise per Week: 6 days    Minutes of Exercise per Session: 50 min  Stress: No Stress Concern Present (08/01/2024)   [employer] of Occupational Health - Occupational Stress Questionnaire    Feeling of Stress: Only a little  Social Connections: Moderately Integrated (08/01/2024)   Social Connection and Isolation Panel    Frequency of Communication with Friends and Family: More than three times a week    Frequency of Social Gatherings with Friends and Family: Three times a week    Attends Religious Services: 1 to 4 times per year    Active Member of Clubs or Organizations: No    Attends Engineer, Structural: Not on file    Marital Status: Married  Recent Concern: Social Connections - Moderately Isolated (07/11/2024)   Social Connection and Isolation Panel    Frequency of Communication with Friends and Family: More than three times a week    Frequency of Social Gatherings with Friends and Family: More than three times a week    Attends Religious Services: Never    Database Administrator or Organizations: No    Attends Banker Meetings: Never    Marital Status: Married  Catering Manager Violence: Not At Risk (07/11/2024)   Humiliation, Afraid, Rape, and Kick questionnaire     Fear of Current or Ex-Partner: No    Emotionally Abused: No    Physically Abused: No    Sexually Abused: No   Social History   Tobacco Use  Smoking Status Former   Current packs/day: 0.00   Average packs/day: 1 pack/day for 20.0 years (20.0 ttl pk-yrs)   Types: Cigarettes   Start date: 53   Quit date: 2000   Years since quitting: 25.8  Smokeless Tobacco Never   Social History   Substance and Sexual Activity  Alcohol Use Not Currently   Comment: 2 drinks per year    Family History:  Family History  Problem Relation Age of Onset   Osteoporosis Mother    Thyroid disease Mother    Heart disease Father    Heart attack Father 75   Cancer Sister        endometrial   Cardiomyopathy Brother    Cancer Maternal Grandmother        vulvar   Diabetes Maternal Grandmother    AAA (abdominal aortic aneurysm) Maternal Grandfather    Stroke Paternal Grandmother    Heart disease Paternal Grandfather    Breast cancer Neg Hx     Past medical history, surgical history, medications, allergies, family history and social history reviewed with patient today and changes made to appropriate areas of the chart.   Review of Systems  Constitutional: Negative.   HENT: Negative.    Eyes: Negative.   Respiratory: Negative.    Cardiovascular: Negative.   Gastrointestinal: Negative.   Genitourinary: Negative.   Musculoskeletal: Negative.   Skin: Negative.  Neurological: Negative.   Endo/Heme/Allergies: Negative.   Psychiatric/Behavioral: Negative.     All other ROS negative except what is listed above and in the HPI.      Objective:    BP 128/81   Pulse 67   Temp 97.9 F (36.6 C) (Oral)   Ht 5' 8 (1.727 m)   Wt 180 lb 3.2 oz (81.7 kg)   LMP  (LMP Unknown)   SpO2 99%   BMI 27.40 kg/m   Wt Readings from Last 3 Encounters:  08/01/24 180 lb 3.2 oz (81.7 kg)  07/11/24 180 lb (81.6 kg)  07/08/24 180 lb (81.6 kg)    Physical Exam Vitals and nursing note reviewed.   Constitutional:      General: She is not in acute distress.    Appearance: Normal appearance. She is not ill-appearing, toxic-appearing or diaphoretic.  HENT:     Head: Normocephalic and atraumatic.     Right Ear: Tympanic membrane, ear canal and external ear normal. There is no impacted cerumen.     Left Ear: Tympanic membrane, ear canal and external ear normal. There is no impacted cerumen.     Nose: Nose normal. No congestion or rhinorrhea.     Mouth/Throat:     Mouth: Mucous membranes are moist.     Pharynx: Oropharynx is clear. No oropharyngeal exudate or posterior oropharyngeal erythema.  Eyes:     General: No scleral icterus.       Right eye: No discharge.        Left eye: No discharge.     Extraocular Movements: Extraocular movements intact.     Conjunctiva/sclera: Conjunctivae normal.     Pupils: Pupils are equal, round, and reactive to light.  Neck:     Vascular: No carotid bruit.  Cardiovascular:     Rate and Rhythm: Normal rate and regular rhythm.     Pulses: Normal pulses.     Heart sounds: No murmur heard.    No friction rub. No gallop.  Pulmonary:     Effort: Pulmonary effort is normal. No respiratory distress.     Breath sounds: Normal breath sounds. No stridor. No wheezing, rhonchi or rales.  Chest:     Chest wall: No tenderness.  Abdominal:     General: Abdomen is flat. Bowel sounds are normal. There is no distension.     Palpations: Abdomen is soft. There is no mass.     Tenderness: There is no abdominal tenderness. There is no right CVA tenderness, left CVA tenderness, guarding or rebound.     Hernia: No hernia is present.  Genitourinary:    Comments: Breast and pelvic exams deferred with shared decision making Musculoskeletal:        General: No swelling, tenderness, deformity or signs of injury.     Cervical back: Normal range of motion and neck supple. No rigidity. No muscular tenderness.     Right lower leg: No edema.     Left lower leg: No edema.   Lymphadenopathy:     Cervical: No cervical adenopathy.  Skin:    General: Skin is warm and dry.     Capillary Refill: Capillary refill takes less than 2 seconds.     Coloration: Skin is not jaundiced or pale.     Findings: No bruising, erythema, lesion or rash.  Neurological:     General: No focal deficit present.     Mental Status: She is alert and oriented to person, place, and time. Mental status is at  baseline.     Cranial Nerves: No cranial nerve deficit.     Sensory: No sensory deficit.     Motor: No weakness.     Coordination: Coordination normal.     Gait: Gait normal.     Deep Tendon Reflexes: Reflexes normal.  Psychiatric:        Mood and Affect: Mood normal.        Behavior: Behavior normal.        Thought Content: Thought content normal.        Judgment: Judgment normal.     Results for orders placed or performed during the hospital encounter of 07/08/24  CBC   Collection Time: 07/08/24  9:54 AM  Result Value Ref Range   WBC 8.1 4.0 - 10.5 K/uL   RBC 4.80 3.87 - 5.11 MIL/uL   Hemoglobin 14.7 12.0 - 15.0 g/dL   HCT 57.8 63.9 - 53.9 %   MCV 87.7 80.0 - 100.0 fL   MCH 30.6 26.0 - 34.0 pg   MCHC 34.9 30.0 - 36.0 g/dL   RDW 88.0 88.4 - 84.4 %   Platelets 348 150 - 400 K/uL   nRBC 0.0 0.0 - 0.2 %  Basic metabolic panel   Collection Time: 07/08/24  9:54 AM  Result Value Ref Range   Sodium 139 135 - 145 mmol/L   Potassium 4.3 3.5 - 5.1 mmol/L   Chloride 105 98 - 111 mmol/L   CO2 22 22 - 32 mmol/L   Glucose, Bld 96 70 - 99 mg/dL   BUN 16 8 - 23 mg/dL   Creatinine, Ser 9.09 0.44 - 1.00 mg/dL   Calcium 9.0 8.9 - 89.6 mg/dL   GFR, Estimated >39 >39 mL/min   Anion gap 12 5 - 15      Assessment & Plan:   Problem List Items Addressed This Visit       Other   Hypercholesteremia   Rechecking labs today. Await results. Treat as needed.       Relevant Orders   CBC with Differential/Platelet   Comprehensive metabolic panel with GFR   Lipid Panel w/o  Chol/HDL Ratio   Major depression in full remission   Under good control on current regimen. Continue current regimen. Continue to monitor. Call with any concerns. Refills given.        Relevant Medications   citalopram  (CELEXA ) 40 MG tablet   LORazepam  (ATIVAN ) 0.5 MG tablet   citalopram  (CELEXA ) 20 MG tablet   Other Relevant Orders   CBC with Differential/Platelet   Comprehensive metabolic panel with GFR   TSH   Other Visit Diagnoses       Routine general medical examination at a health care facility    -  Primary   Vaccines updated. Screening labs checked today. Pap N/A. Mammo and DEXA up to date. Colonoscopy scheduled. Continue diet and exercise. Call with any concerns.     Lump of skin of back       Concern for lipoma- will refer to general surgery. Await their input.   Relevant Orders   Ambulatory referral to General Surgery     Needs flu shot       Flu shot given today.   Relevant Orders   Flu vaccine HIGH DOSE PF(Fluzone Trivalent) (Completed)     Need for COVID-19 vaccine       COVID shot given today.   Relevant Orders   Pfizer Comirnaty Covid -19 Vaccine 53yrs and older (Completed)  Follow up plan: Return in about 6 months (around 01/30/2025).   LABORATORY TESTING:  - Pap smear: not applicable  IMMUNIZATIONS:   - Tdap: Tetanus vaccination status reviewed: last tetanus booster within 10 years. - Influenza: Administered today - Pneumovax: Up to date - Prevnar: Up to date - COVID: Administered today - HPV: Not applicable - Shingrix vaccine: Up to date  SCREENING: -Mammogram: Up to date  - Colonoscopy: scheduled for December  - Bone Density: Up to date   PATIENT COUNSELING:   Advised to take 1 mg of folate supplement per day if capable of pregnancy.   Sexuality: Discussed sexually transmitted diseases, partner selection, use of condoms, avoidance of unintended pregnancy  and contraceptive alternatives.   Advised to avoid cigarette smoking.  I  discussed with the patient that most people either abstain from alcohol or drink within safe limits (<=14/week and <=4 drinks/occasion for males, <=7/weeks and <= 3 drinks/occasion for females) and that the risk for alcohol disorders and other health effects rises proportionally with the number of drinks per week and how often a drinker exceeds daily limits.  Discussed cessation/primary prevention of drug use and availability of treatment for abuse.   Diet: Encouraged to adjust caloric intake to maintain  or achieve ideal body weight, to reduce intake of dietary saturated fat and total fat, to limit sodium intake by avoiding high sodium foods and not adding table salt, and to maintain adequate dietary potassium and calcium preferably from fresh fruits, vegetables, and low-fat dairy products.    stressed the importance of regular exercise  Injury prevention: Discussed safety belts, safety helmets, smoke detector, smoking near bedding or upholstery.   Dental health: Discussed importance of regular tooth brushing, flossing, and dental visits.    NEXT PREVENTATIVE PHYSICAL DUE IN 1 YEAR. Return in about 6 months (around 01/30/2025).

## 2024-08-02 LAB — CBC WITH DIFFERENTIAL/PLATELET
Basophils Absolute: 0 x10E3/uL (ref 0.0–0.2)
Basos: 1 %
EOS (ABSOLUTE): 0.2 x10E3/uL (ref 0.0–0.4)
Eos: 3 %
Hematocrit: 42.5 % (ref 34.0–46.6)
Hemoglobin: 14.6 g/dL (ref 11.1–15.9)
Immature Grans (Abs): 0 x10E3/uL (ref 0.0–0.1)
Immature Granulocytes: 0 %
Lymphocytes Absolute: 2.5 x10E3/uL (ref 0.7–3.1)
Lymphs: 29 %
MCH: 31.3 pg (ref 26.6–33.0)
MCHC: 34.4 g/dL (ref 31.5–35.7)
MCV: 91 fL (ref 79–97)
Monocytes Absolute: 0.7 x10E3/uL (ref 0.1–0.9)
Monocytes: 8 %
Neutrophils Absolute: 5.2 x10E3/uL (ref 1.4–7.0)
Neutrophils: 59 %
Platelets: 416 x10E3/uL (ref 150–450)
RBC: 4.67 x10E6/uL (ref 3.77–5.28)
RDW: 12.1 % (ref 11.7–15.4)
WBC: 8.6 x10E3/uL (ref 3.4–10.8)

## 2024-08-02 LAB — COMPREHENSIVE METABOLIC PANEL WITH GFR
ALT: 11 IU/L (ref 0–32)
AST: 14 IU/L (ref 0–40)
Albumin: 4.4 g/dL (ref 3.9–4.9)
Alkaline Phosphatase: 100 IU/L (ref 49–135)
BUN/Creatinine Ratio: 13 (ref 12–28)
BUN: 10 mg/dL (ref 8–27)
Bilirubin Total: 0.4 mg/dL (ref 0.0–1.2)
CO2: 22 mmol/L (ref 20–29)
Calcium: 9.4 mg/dL (ref 8.7–10.3)
Chloride: 96 mmol/L (ref 96–106)
Creatinine, Ser: 0.77 mg/dL (ref 0.57–1.00)
Globulin, Total: 2.3 g/dL (ref 1.5–4.5)
Glucose: 87 mg/dL (ref 70–99)
Potassium: 4.9 mmol/L (ref 3.5–5.2)
Sodium: 132 mmol/L — ABNORMAL LOW (ref 134–144)
Total Protein: 6.7 g/dL (ref 6.0–8.5)
eGFR: 85 mL/min/1.73 (ref >59)

## 2024-08-02 LAB — LIPID PANEL W/O CHOL/HDL RATIO
Cholesterol, Total: 250 mg/dL — ABNORMAL HIGH (ref 100–199)
HDL: 39 mg/dL — ABNORMAL LOW (ref >39)
LDL Chol Calc (NIH): 133 mg/dL — ABNORMAL HIGH (ref 0–99)
Triglycerides: 430 mg/dL — ABNORMAL HIGH (ref 0–149)
VLDL Cholesterol Cal: 78 mg/dL — ABNORMAL HIGH (ref 5–40)

## 2024-08-02 LAB — TSH: TSH: 1.67 u[IU]/mL (ref 0.450–4.500)

## 2024-08-03 DIAGNOSIS — D171 Benign lipomatous neoplasm of skin and subcutaneous tissue of trunk: Secondary | ICD-10-CM | POA: Diagnosis not present

## 2024-08-04 ENCOUNTER — Ambulatory Visit: Payer: Self-pay | Admitting: Family Medicine

## 2024-08-17 ENCOUNTER — Other Ambulatory Visit: Payer: Self-pay

## 2024-08-17 DIAGNOSIS — Z03818 Encounter for observation for suspected exposure to other biological agents ruled out: Secondary | ICD-10-CM | POA: Diagnosis not present

## 2024-08-17 DIAGNOSIS — J40 Bronchitis, not specified as acute or chronic: Secondary | ICD-10-CM | POA: Diagnosis not present

## 2024-08-17 DIAGNOSIS — J029 Acute pharyngitis, unspecified: Secondary | ICD-10-CM | POA: Diagnosis not present

## 2024-08-17 DIAGNOSIS — R051 Acute cough: Secondary | ICD-10-CM | POA: Diagnosis not present

## 2024-08-17 MED ORDER — PREDNISONE 10 MG PO TABS
10.0000 mg | ORAL_TABLET | Freq: Two times a day (BID) | ORAL | 0 refills | Status: DC
  Filled 2024-08-17: qty 10, 5d supply, fill #0

## 2024-08-17 MED ORDER — DOXYCYCLINE HYCLATE 100 MG PO CAPS
100.0000 mg | ORAL_CAPSULE | Freq: Two times a day (BID) | ORAL | 0 refills | Status: DC
  Filled 2024-08-17: qty 14, 7d supply, fill #0

## 2024-08-17 MED ORDER — BENZONATATE 100 MG PO CAPS
100.0000 mg | ORAL_CAPSULE | Freq: Three times a day (TID) | ORAL | 0 refills | Status: AC | PRN
  Filled 2024-08-17: qty 20, 7d supply, fill #0

## 2024-08-18 ENCOUNTER — Ambulatory Visit: Payer: Self-pay

## 2024-08-18 NOTE — Telephone Encounter (Signed)
 FYI Only or Action Required?: Action required by provider: clinical question for provider and update on patient condition.  Patient was last seen in primary care on 08/01/2024 by Vicci Bouchard P, DO.  Called Nurse Triage reporting Cough.  Triage Disposition: Call PCP Within 24 Hours  Patient/caregiver understands and will follow disposition?: Yes      Copied from CRM #[id number]. Topic: Clinical - Red Word Triage >> Aug 18, 2024  9:40 AM Wess RAMAN wrote: Red Word that prompted transfer to Nurse Triage: Patient has been sick since last Thursday. She did a chest xray at [facility] and they stated something is pushing up in her chest and they recommend a CT Scan.   Symptoms: Cough, Fatigue, headache, sore throat, neck pain      Reason for Disposition  [1] Follow-up call from patient regarding patient's clinical status AND [2] information NON-URGENT  Answer Assessment - Initial Assessment Questions 1. REASON FOR CALL or QUESTION: What is your reason for calling today? or How can I best     Patient was seen yesterday at [facility] and had a chest x-ray, results below. She was advised follow up with her PCP for a CT chest and wanted to know if one could be ordered. She is agreeable with an appointment if needed. Please advise.  2. CALLER: Document the source of call. (e.g., laboratory staff, caregiver or patient).     Patient    X-ray chest PA and lateral (08/17/2024 3:19 PM EST) Impression (08/17/2024 4:47 PM EST) 1.  No acute cardiopulmonary process. 2.  Incidentally noted right anterior diaphragmatic eventration.  Protocols used: PCP Call - No Triage-A-AH

## 2024-08-18 NOTE — Telephone Encounter (Signed)
 Scheduled with another location

## 2024-08-21 ENCOUNTER — Encounter: Payer: Self-pay | Admitting: Family Medicine

## 2024-08-21 ENCOUNTER — Ambulatory Visit: Admitting: Internal Medicine

## 2024-08-21 ENCOUNTER — Ambulatory Visit: Admitting: Family Medicine

## 2024-08-21 VITALS — BP 128/84 | HR 61 | Temp 98.1°F | Ht 68.0 in | Wt 182.2 lb

## 2024-08-21 DIAGNOSIS — J208 Acute bronchitis due to other specified organisms: Secondary | ICD-10-CM

## 2024-08-21 DIAGNOSIS — B9689 Other specified bacterial agents as the cause of diseases classified elsewhere: Secondary | ICD-10-CM

## 2024-08-21 MED ORDER — PREDNISONE 10 MG PO TABS: ORAL_TABLET | ORAL | 0 refills | Status: DC

## 2024-08-21 NOTE — Telephone Encounter (Signed)
 Pt has appt with [facility] today

## 2024-08-21 NOTE — Progress Notes (Signed)
 BP 128/84   Pulse 61   Temp 98.1 F (36.7 C) (Oral)   Ht 5' 8 (1.727 m)   Wt 182 lb 3.2 oz (82.6 kg)   LMP  (LMP Unknown)   SpO2 98%   BMI 27.70 kg/m    Subjective:    Patient ID: Debra Cannon, female    DOB: 12-22-1955, 68 y.o.   MRN: [id number]  HPI: Debra Cannon is a 68 y.o. female  Chief Complaint  Patient presents with   Cough    11/4. Nasal congestion, sore throat. Red, irritated. Tested covid/flu neg last Thursday.    UPPER RESPIRATORY TRACT INFECTION- she went to UC on Thursday and was started on prednisone  and doxycycline. She had a normal CXR except for elevation of R hemidiaphragm. Duration: almost 2 weeks Worst symptom: cough Fever: no Cough: yes Shortness of breath: yes Wheezing: yes Chest pain: no Chest tightness: no Chest congestion: no Nasal congestion: no Runny nose: no Post nasal drip: yes Sneezing: no Sore throat: no Swollen glands: no Sinus pressure: no Headache: no Face pain: no Toothache: no Ear pain: yes bilateral Ear pressure: yes bilateral Eyes red/itching:no Eye drainage/crusting: no  Vomiting: no Rash: no Fatigue: yes Sick contacts: no Strep contacts: no  Context: stable Recurrent sinusitis: no Relief with OTC cold/cough medications: no  Treatments attempted: antibiotics   Relevant past medical, surgical, family and social history reviewed and updated as indicated. Interim medical history since our last visit reviewed. Allergies and medications reviewed and updated.  Review of Systems  Constitutional: Negative.   HENT: Negative.    Respiratory:  Positive for cough, chest tightness, shortness of breath and wheezing. Negative for apnea, choking and stridor.   Cardiovascular: Negative.   Musculoskeletal: Negative.   Psychiatric/Behavioral: Negative.      Per HPI unless specifically indicated above     Objective:    BP 128/84   Pulse 61   Temp 98.1 F (36.7 C) (Oral)   Ht 5' 8 (1.727 m)   Wt 182 lb 3.2 oz (82.6  kg)   LMP  (LMP Unknown)   SpO2 98%   BMI 27.70 kg/m   Wt Readings from Last 3 Encounters:  08/21/24 182 lb 3.2 oz (82.6 kg)  08/01/24 180 lb 3.2 oz (81.7 kg)  07/11/24 180 lb (81.6 kg)    Physical Exam Vitals and nursing note reviewed.  Constitutional:      General: She is not in acute distress.    Appearance: Normal appearance. She is not ill-appearing, toxic-appearing or diaphoretic.  HENT:     Head: Normocephalic and atraumatic.     Right Ear: Tympanic membrane, ear canal and external ear normal.     Left Ear: Tympanic membrane, ear canal and external ear normal.     Nose: Nose normal. No congestion or rhinorrhea.     Mouth/Throat:     Mouth: Mucous membranes are moist.     Pharynx: Oropharynx is clear. No oropharyngeal exudate or posterior oropharyngeal erythema.  Eyes:     General: No scleral icterus.       Right eye: No discharge.        Left eye: No discharge.     Extraocular Movements: Extraocular movements intact.     Conjunctiva/sclera: Conjunctivae normal.     Pupils: Pupils are equal, round, and reactive to light.  Cardiovascular:     Rate and Rhythm: Normal rate and regular rhythm.     Pulses: Normal pulses.  Heart sounds: Normal heart sounds. No murmur heard.    No friction rub. No gallop.  Pulmonary:     Effort: Pulmonary effort is normal. No respiratory distress.     Breath sounds: Normal breath sounds. No stridor. No wheezing, rhonchi or rales.  Chest:     Chest wall: No tenderness.  Musculoskeletal:        General: Normal range of motion.     Cervical back: Normal range of motion and neck supple.  Skin:    General: Skin is warm and dry.     Capillary Refill: Capillary refill takes less than 2 seconds.     Coloration: Skin is not jaundiced or pale.     Findings: No bruising, erythema, lesion or rash.  Neurological:     General: No focal deficit present.     Mental Status: She is alert and oriented to person, place, and time. Mental status is at  baseline.  Psychiatric:        Mood and Affect: Mood normal.        Behavior: Behavior normal.        Thought Content: Thought content normal.        Judgment: Judgment normal.     Results for orders placed or performed in visit on 08/01/24  CBC with Differential/Platelet   Collection Time: 08/01/24  2:23 PM  Result Value Ref Range   WBC 8.6 3.4 - 10.8 x10E3/uL   RBC 4.67 3.77 - 5.28 x10E6/uL   Hemoglobin 14.6 11.1 - 15.9 g/dL   Hematocrit 57.4 65.9 - 46.6 %   MCV 91 79 - 97 fL   MCH 31.3 26.6 - 33.0 pg   MCHC 34.4 31.5 - 35.7 g/dL   RDW 87.8 88.2 - 84.5 %   Platelets 416 150 - 450 x10E3/uL   Neutrophils 59 Not Estab. %   Lymphs 29 Not Estab. %   Monocytes 8 Not Estab. %   Eos 3 Not Estab. %   Basos 1 Not Estab. %   Neutrophils Absolute 5.2 1.4 - 7.0 x10E3/uL   Lymphocytes Absolute 2.5 0.7 - 3.1 x10E3/uL   Monocytes Absolute 0.7 0.1 - 0.9 x10E3/uL   EOS (ABSOLUTE) 0.2 0.0 - 0.4 x10E3/uL   Basophils Absolute 0.0 0.0 - 0.2 x10E3/uL   Immature Granulocytes 0 Not Estab. %   Immature Grans (Abs) 0.0 0.0 - 0.1 x10E3/uL  Comprehensive metabolic panel with GFR   Collection Time: 08/01/24  2:23 PM  Result Value Ref Range   Glucose 87 70 - 99 mg/dL   BUN 10 8 - 27 mg/dL   Creatinine, Ser 9.22 0.57 - 1.00 mg/dL   eGFR 85 >40 fO/fpw/8.26   BUN/Creatinine Ratio 13 12 - 28   Sodium 132 (L) 134 - 144 mmol/L   Potassium 4.9 3.5 - 5.2 mmol/L   Chloride 96 96 - 106 mmol/L   CO2 22 20 - 29 mmol/L   Calcium 9.4 8.7 - 10.3 mg/dL   Total Protein 6.7 6.0 - 8.5 g/dL   Albumin 4.4 3.9 - 4.9 g/dL   Globulin, Total 2.3 1.5 - 4.5 g/dL   Bilirubin Total 0.4 0.0 - 1.2 mg/dL   Alkaline Phosphatase 100 49 - 135 IU/L   AST 14 0 - 40 IU/L   ALT 11 0 - 32 IU/L  Lipid Panel w/o Chol/HDL Ratio   Collection Time: 08/01/24  2:23 PM  Result Value Ref Range   Cholesterol, Total 250 (H) 100 - 199 mg/dL   Triglycerides  430 (H) 0 - 149 mg/dL   HDL 39 (L) >60 mg/dL   VLDL Cholesterol Cal 78 (H) 5 - 40  mg/dL   LDL Chol Calc (NIH) 866 (H) 0 - 99 mg/dL  TSH   Collection Time: 08/01/24  2:23 PM  Result Value Ref Range   TSH 1.670 0.450 - 4.500 uIU/mL      Assessment & Plan:   Problem List Items Addressed This Visit   None Visit Diagnoses       Acute bacterial bronchitis    -  Primary   Continue doxycycline. Will increase prednisone . Rest and symptomatic care. Call with any concerns.        Follow up plan: Return for As scheduled.

## 2024-08-29 ENCOUNTER — Encounter: Payer: Self-pay | Admitting: Family Medicine

## 2024-08-30 ENCOUNTER — Telehealth: Payer: Self-pay

## 2024-08-30 MED ORDER — AMOXICILLIN-POT CLAVULANATE 875-125 MG PO TABS
1.0000 | ORAL_TABLET | Freq: Two times a day (BID) | ORAL | 0 refills | Status: AC
  Filled 2024-08-30: qty 20, 10d supply, fill #0

## 2024-08-30 NOTE — Telephone Encounter (Signed)
 Copied from CRM #[id number]. Topic: Clinical - Medication Question >> Aug 30, 2024  9:35 AM Montie POUR wrote: Reason for CRM:  She wanted to see if her doctor could call in a different antibiotic for her cough and congestion. It is not getting worse but no better. Please call her at [phone] to discuss. She would like medication sent to [facility] in [place], [place]

## 2024-08-30 NOTE — Telephone Encounter (Signed)
 Augmentin sent to her pharmacy.

## 2024-08-30 NOTE — Telephone Encounter (Signed)
 Routing to provider to advise. Patient last seen 08/21/24

## 2024-09-01 ENCOUNTER — Other Ambulatory Visit: Payer: Self-pay

## 2024-09-12 ENCOUNTER — Inpatient Hospital Stay
Admission: RE | Admit: 2024-09-12 | Discharge: 2024-09-12 | Attending: Emergency Medicine | Admitting: Emergency Medicine

## 2024-09-12 ENCOUNTER — Ambulatory Visit

## 2024-09-12 VITALS — BP 144/77 | HR 68 | Temp 98.9°F | Resp 18 | Wt 184.4 lb

## 2024-09-12 DIAGNOSIS — R052 Subacute cough: Secondary | ICD-10-CM

## 2024-09-12 MED ORDER — IPRATROPIUM BROMIDE 0.06 % NA SOLN: 2.0000 | Freq: Four times a day (QID) | NASAL | 0 refills | Status: AC

## 2024-09-12 MED ORDER — PROMETHAZINE-DM 6.25-15 MG/5ML PO SYRP: 5.0000 mL | ORAL_SOLUTION | Freq: Four times a day (QID) | ORAL | 0 refills | Status: AC | PRN

## 2024-09-12 MED ORDER — ALBUTEROL SULFATE HFA 108 (90 BASE) MCG/ACT IN AERS: 2.0000 | INHALATION_SPRAY | RESPIRATORY_TRACT | 0 refills | Status: AC | PRN

## 2024-09-12 MED ORDER — LEVOFLOXACIN 750 MG PO TABS: 750.0000 mg | ORAL_TABLET | Freq: Every day | ORAL | 0 refills | Status: AC

## 2024-09-12 MED ORDER — AEROCHAMBER MV MISC: 1 refills | Status: AC

## 2024-09-12 NOTE — ED Provider Notes (Signed)
 HPI  SUBJECTIVE:  Debra Cannon is a 68 y.o. female who presents with ***  Seen at the [facility] walk-in clinic on 11/13 for respiratory complaints starting on 11/4.    Chest x-ray negative for pneumonia respiratory panel negative.  She was treated for bronchitis. Sent home on 7 days of doxycycline , Tessalon , 20 mg of prednisone  for 5 days. she followed up with her PCP on 11/17, prednisone  was increased.  She was started on Augmentin  on 11/26.  She has a past medical of hypercholesterolemia, hypertension, depression, GERD for which she takes intermittent Prilosec.  [facility]  Past Medical History:  Diagnosis Date   Anxiety    sometimes last 6 months most recently   Depression    1 1/2 years ago most recent   Herpes    Hypercholesteremia    Hypertension    Menopause    Migraine     Past Surgical History:  Procedure Laterality Date   APPENDECTOMY     BREAST EXCISIONAL BIOPSY Right 2019   lipoma   COLONOSCOPY  09/03/2008   Dr Viktoria   COLONOSCOPY WITH PROPOFOL  N/A 06/23/2019   Procedure: COLONOSCOPY WITH PROPOFOL ;  Surgeon: Therisa Bi, MD;  [facility] ENDOSCOPY;  Service: Gastroenterology;  Laterality: N/A;   laporoscopy     REPAIR ANKLE LIGAMENT Left 2004 & 2005   UPPER GI ENDOSCOPY  2009   Dr Viktoria    Family History  Problem Relation Age of Onset   Osteoporosis Mother    Thyroid disease Mother    Heart disease Father    Heart attack Father 26   Cancer Sister        endometrial   Cardiomyopathy Brother    Cancer Maternal Grandmother        vulvar   Diabetes Maternal Grandmother    AAA (abdominal aortic aneurysm) Maternal Grandfather    Stroke Paternal Grandmother    Heart disease Paternal Grandfather    Breast cancer Neg Hx     Social History   Tobacco Use   Smoking status: Former    Current packs/day: 0.00    Average packs/day: 1 pack/day for 20.0 years (20.0 ttl pk-yrs)    Types: Cigarettes    Start date: 101    Quit date: 2000     Years since quitting: 25.9   Smokeless tobacco: Never  Vaping Use   Vaping status: Never Used  Substance Use Topics   Alcohol use: Not Currently    Comment: 2 drinks per year   Drug use: No     Current Facility-Administered Medications:    aspirin  EC tablet 324 mg, 324 mg, Oral, Once, End, Christopher, MD  Current Outpatient Medications:    albuterol  (VENTOLIN  HFA) 108 (90 Base) MCG/ACT inhaler, Inhale 2 puffs into the lungs every 4 (four) hours as needed for wheezing or shortness of breath. Dispense with aerochamber, Disp: 1 each, Rfl: 0   ipratropium (ATROVENT ) 0.06 % nasal spray, Place 2 sprays into both nostrils 4 (four) times daily., Disp: 15 mL, Rfl: 0   levofloxacin  (LEVAQUIN ) 750 MG tablet, Take 1 tablet (750 mg total) by mouth daily for 5 days., Disp: 5 tablet, Rfl: 0   promethazine -dextromethorphan (PROMETHAZINE -DM) 6.25-15 MG/5ML syrup, Take 5 mLs by mouth 4 (four) times daily as needed for cough., Disp: 118 mL, Rfl: 0   Spacer/Aero-Holding Chambers (AEROCHAMBER MV) inhaler, Use as instructed, Disp: 1 each, Rfl: 1   acyclovir  (ZOVIRAX ) 400 MG tablet, Take 1 tablet (400 mg total) by  mouth 3 (three) times daily as needed., Disp: 270 tablet, Rfl: 1   amoxicillin -clavulanate (AUGMENTIN ) 875-125 MG tablet, Take 1 tablet by mouth 2 (two) times daily., Disp: 20 tablet, Rfl: 0   B Complex Vitamins (VITAMIN B COMPLEX) TABS, Take 1 tablet by mouth daily., Disp: , Rfl:    benzonatate  (TESSALON ) 100 MG capsule, Take 1 capsule (100 mg total) by mouth 3 (three) times daily as needed., Disp: 20 capsule, Rfl: 0   Cholecalciferol (VITAMIN D  PO), Take 2,000 Units by mouth daily. , Disp: , Rfl:    citalopram  (CELEXA ) 20 MG tablet, Take 1 tablet (20 mg total) by mouth daily. To be taken with the 40mg  for 60mg  total, Disp: 90 tablet, Rfl: 1   citalopram  (CELEXA ) 40 MG tablet, Take 1 tablet (40 mg total) by mouth daily. To be taken with the 20mg  for 60mg  total, Disp: 90 tablet, Rfl: 1   fluticasone   (FLONASE ) 50 MCG/ACT nasal spray, Spray 2 sprays into each nostril once daily., Disp: 16 g, Rfl: 12   LORazepam  (ATIVAN ) 0.5 MG tablet, Take 1 tablet (0.5 mg total) by mouth 2 (two) times daily as needed for anxiety., Disp: 30 tablet, Rfl: 0   meloxicam  (MOBIC ) 7.5 MG tablet, Take 1 tablet (7.5 mg total) by mouth 2 (two) times daily with a meal. (Patient taking differently: Take 7.5 mg by mouth 2 (two) times daily with a meal. PRN), Disp: 30 tablet, Rfl: 1   omeprazole (PRILOSEC) 20 MG capsule, Take 20 mg by mouth daily as needed. , Disp: , Rfl:    ondansetron  (ZOFRAN -ODT) 4 MG disintegrating tablet, Take 1 tablet (4 mg total) by mouth every 8 (eight) hours as needed., Disp: 20 tablet, Rfl: 0   SUMAtriptan  (IMITREX ) 100 MG tablet, TAKE 1 TABLET BY MOUTH AS NEEDED. MAY REPEAT IN 2 HOURS IF HEADACHE PERSISTS OR RECURS., Disp: 9 tablet, Rfl: 12  No Known Allergies   ROS  As noted in HPI.   Physical Exam  BP (!) 144/77 (BP Location: Left Arm)   Pulse 68   Temp 98.9 F (37.2 C) (Oral)   Resp 18   Wt 83.6 kg   LMP  (LMP Unknown)   SpO2 97%   BMI 28.04 kg/m   Constitutional: Well developed, well nourished, no acute distress Eyes: PERRL, EOMI, conjunctiva normal bilaterally HENT: Normocephalic, atraumatic,mucus membranes moist Respiratory: Clear to auscultation bilaterally, no rales, no wheezing, no rhonchi positive lateral chest wall tenderness especially in the right lower rib. Cardiovascular: Normal rate and rhythm, no murmurs, no gallops, no rubs GI: nondistended skin: No rash, skin intact Musculoskeletal: Calves symmetric, nontender, no edema Neurologic: Alert & oriented x 3, CN III-XII grossly intact, no motor deficits, sensation grossly intact Psychiatric: Speech and behavior appropriate   ED Course   Medications - No data to display  Orders Placed This Encounter  Procedures   DG Chest 2 View    Standing Status:   Standing    Number of Occurrences:   1    Reason for  Exam (SYMPTOM  OR DIAGNOSIS REQUIRED):   cough x1 mon   Ambulatory referral to Pulmonology    Referral Priority:   Routine    Referral Type:   Consultation    Referral Reason:   Specialty Services Required    Requested Specialty:   Pulmonary Disease    Number of Visits Requested:   1   No results found for this or any previous visit (from the past 24 hours). DG Chest  2 View Result Date: 09/12/2024 CLINICAL DATA:  1 month history of cough. EXAM: CHEST - 2 VIEW COMPARISON:  09/09/2023 FINDINGS: The lungs are clear without focal pneumonia, edema, pneumothorax or pleural effusion. The cardiopericardial silhouette is within normal limits for size. No acute bony abnormality. IMPRESSION: No active cardiopulmonary disease. Electronically Signed   By: Camellia Candle M.D.   On: 09/12/2024 11:27    ED Clinical Impression  1. Subacute cough      ED Assessment/Plan   {The patient has been seen in Urgent Care in the last 3 years. :1} Outside records reviewed.  As noted in HPI.  Reviewed imaging independently.  No acute cardiopulmonary disease.  Radiology over read consistent with mine. See radiology report for full details.  Discussed chest x-ray result while in department.  Patient presents with a subacute cough.  Doubt CHF.  She did get better with steroids and antibiotics.  While I do not appreciate any pneumonia on her x-ray, we can certainly try Levaquin  750 mg p.o. twice daily for 5 days as not all pneumonias show up on x-ray.  Discussed with her that this is absolutely a last resort as it has significant side effects.  Nobody has tried bronchodilators yet, I do not think another round of steroids would be particularly helpful.  I suspect postnasal drip, bronchospasm or perhaps acid reflux.  Will send home with Atrovent  nasal spray, saline nasal irrigation, Promethazine  DM.  Advised patient to elevate the head of her bed to 30 degrees and to start taking her Prilosec regularly.  Will put in a  pulmonology referral if not better in a week.  Discussed imaging, MDM, treatment plan, and plan for follow-up with patient Discussed sn/sx that should prompt return to the ED. patient agrees with plan.   Meds ordered this encounter  Medications   levofloxacin  (LEVAQUIN ) 750 MG tablet    Sig: Take 1 tablet (750 mg total) by mouth daily for 5 days.    Dispense:  5 tablet    Refill:  0   albuterol  (VENTOLIN  HFA) 108 (90 Base) MCG/ACT inhaler    Sig: Inhale 2 puffs into the lungs every 4 (four) hours as needed for wheezing or shortness of breath. Dispense with aerochamber    Dispense:  1 each    Refill:  0   Spacer/Aero-Holding Chambers (AEROCHAMBER MV) inhaler    Sig: Use as instructed    Dispense:  1 each    Refill:  1   promethazine -dextromethorphan (PROMETHAZINE -DM) 6.25-15 MG/5ML syrup    Sig: Take 5 mLs by mouth 4 (four) times daily as needed for cough.    Dispense:  118 mL    Refill:  0   ipratropium (ATROVENT ) 0.06 % nasal spray    Sig: Place 2 sprays into both nostrils 4 (four) times daily.    Dispense:  15 mL    Refill:  0      *This clinic note was created using Scientist, clinical (histocompatibility and immunogenetics). Therefore, there may be occasional mistakes despite careful proofreading. ?

## 2024-09-12 NOTE — Discharge Instructions (Signed)
 Finish Levaquin , if you feel better.Take two puffs from your albuterol  inhaler with your spacer every 4 hours for 2 days, then every 6 hours for 2 days, then as needed. You can back off if you start to improve  sooner.  Atrovent  nasal spray, saline nasal irrigation with a NeilMed sinus rinse and distilled water for nasal congestion and postnasal drip.  Make sure you drink extra fluids. Return to the ER if you get worse, have a fever >100.4, or any other concerns.   If the spacer is too expensive at the pharmacy, you can get an AeroChamber Z-Stat off of [employer] for about $10-$15.  This could also be due to silent acid reflux.  Elevate the head of your bed to 30 degrees and start taking Prilosec on a regular basis.  Follow-up with pulmonology if you are not better in 2 weeks.  Give them a call in case this referral does not go through.  Go to www.goodrx.com  or www.costplusdrugs.com to look up your medications. This will give you a list of where you can find your prescriptions at the most affordable prices. Or ask the pharmacist what the cash price is, or if they have any other discount programs available to help make your medication more affordable. This can be less expensive than what you would pay with insurance.

## 2024-09-12 NOTE — ED Triage Notes (Signed)
 Pt states respiratory infection/cough off and on x 4 weeks. Received  2 rounds of antibiotics and prednisone .  Finishing up 10 days of Augmentin  tomorrow.  Still have productive cough/ fatigue.  Feel lousy.  No fever. - Entered by patient  Had CXR done at [facility] on 11/13 which was neg for PNA.

## 2024-09-14 ENCOUNTER — Other Ambulatory Visit: Payer: Self-pay

## 2024-09-14 MED ORDER — NA SULFATE-K SULFATE-MG SULF 17.5-3.13-1.6 GM/177ML PO SOLN
ORAL | 0 refills | Status: AC
  Filled 2024-09-14: qty 354, 1d supply, fill #0

## 2024-09-20 ENCOUNTER — Other Ambulatory Visit: Payer: Self-pay

## 2024-10-04 NOTE — Addendum Note (Signed)
 Addended by: Shondell Poulson W on: 10/04/2024 10:23 AM   Modules accepted: Orders

## 2024-10-16 ENCOUNTER — Ambulatory Visit
Admission: RE | Admit: 2024-10-16 | Discharge: 2024-10-16 | Disposition: A | Attending: Gastroenterology | Admitting: Gastroenterology

## 2024-10-16 ENCOUNTER — Ambulatory Visit

## 2024-10-16 ENCOUNTER — Encounter: Admission: RE | Disposition: A | Payer: Self-pay | Source: Home / Self Care | Attending: Gastroenterology

## 2024-10-16 ENCOUNTER — Encounter: Payer: Self-pay | Admitting: Gastroenterology

## 2024-10-16 DIAGNOSIS — D124 Benign neoplasm of descending colon: Secondary | ICD-10-CM | POA: Insufficient documentation

## 2024-10-16 DIAGNOSIS — Z1211 Encounter for screening for malignant neoplasm of colon: Secondary | ICD-10-CM | POA: Diagnosis present

## 2024-10-16 DIAGNOSIS — D122 Benign neoplasm of ascending colon: Secondary | ICD-10-CM | POA: Diagnosis not present

## 2024-10-16 DIAGNOSIS — Z87891 Personal history of nicotine dependence: Secondary | ICD-10-CM | POA: Insufficient documentation

## 2024-10-16 HISTORY — PX: POLYPECTOMY: SHX149

## 2024-10-16 HISTORY — PX: COLONOSCOPY: SHX5424

## 2024-10-16 LAB — HM COLONOSCOPY

## 2024-10-16 SURGERY — COLONOSCOPY
Anesthesia: General

## 2024-10-16 MED ORDER — LIDOCAINE HCL (CARDIAC) PF 100 MG/5ML IV SOSY
PREFILLED_SYRINGE | INTRAVENOUS | Status: DC | PRN
  Administered 2024-10-16: 50 mg via INTRAVENOUS

## 2024-10-16 MED ORDER — PROPOFOL 10 MG/ML IV BOLUS
INTRAVENOUS | Status: DC | PRN
  Administered 2024-10-16: 100 mg via INTRAVENOUS
  Administered 2024-10-16: 150 ug/kg/min via INTRAVENOUS

## 2024-10-16 MED ORDER — SODIUM CHLORIDE 0.9 % IV SOLN
INTRAVENOUS | Status: DC
  Administered 2024-10-16: 500 mL via INTRAVENOUS

## 2024-10-16 NOTE — Anesthesia Procedure Notes (Signed)
 Date/Time: 10/16/2024 11:20 AM  Performed by: Dominica Krabbe, CRNAPre-anesthesia Checklist: Patient identified, Emergency Drugs available, Suction available and Patient being monitored Patient Re-evaluated:Patient Re-evaluated prior to induction Oxygen Delivery Method: Nasal cannula Preoxygenation: Pre-oxygenation with 100% oxygen Induction Type: IV induction

## 2024-10-16 NOTE — Op Note (Signed)
 [facility] Gastroenterology Patient Name: Debra Cannon Procedure Date: 10/16/2024 11:09 AM MRN: [id number] Account #: [id number] Date of Birth: 02/27/1956 Admit Type: Outpatient Age: 69 Room: [facility] ENDO ROOM 1 Gender: Female Note Status: Finalized Instrument Name: Colon Scope [id number] Procedure:             Colonoscopy Indications:           Surveillance: Personal history of adenomatous polyps                         on last colonoscopy 5 years ago, Last colonoscopy:                         September 2020 Providers:             Ruel Kung MD, MD Medicines:             Monitored Anesthesia Care Complications:         No immediate complications. Procedure:             Pre-Anesthesia Assessment:                        - Prior to the procedure, a History and Physical was                         performed, and patient medications, allergies and                         sensitivities were reviewed. The patient's tolerance                         of previous anesthesia was reviewed.                        - The risks and benefits of the procedure and the                         sedation options and risks were discussed with the                         patient. All questions were answered and informed                         consent was obtained.                        - ASA Grade Assessment: II - A patient with mild                         systemic disease.                        After obtaining informed consent, the colonoscope was                         passed under direct vision. Throughout the procedure,                         the patient's blood pressure, pulse, and oxygen  saturations were monitored continuously. The                         Colonoscope was introduced through the anus and                         advanced to the the cecum, identified by the                         appendiceal orifice. The colonoscopy was performed                          with ease. The patient tolerated the procedure well.                         The quality of the bowel preparation was excellent.                         The ileocecal valve, appendiceal orifice, and rectum                         were photographed. Findings:      The perianal and digital rectal examinations were normal.      Two sessile polyps were found in the descending colon and ascending       colon. The polyps were 4 to 5 mm in size. These polyps were removed with       a cold snare. Resection and retrieval were complete.      The exam was otherwise without abnormality on direct and retroflexion       views. Impression:            - Two 4 to 5 mm polyps in the descending colon and in                         the ascending colon, removed with a cold snare.                         Resected and retrieved.                        - The examination was otherwise normal on direct and                         retroflexion views. Recommendation:        - Discharge patient to home (with escort).                        - Resume previous diet.                        - Continue present medications.                        - Await pathology results.                        - Repeat colonoscopy in 5 years for surveillance. Procedure Code(s):     --- Professional ---  54614, Colonoscopy, flexible; with removal of                         tumor(s), polyp(s), or other lesion(s) by snare                         technique Diagnosis Code(s):     --- Professional ---                        Z86.010, Personal history of colonic polyps                        D12.4, Benign neoplasm of descending colon                        D12.2, Benign neoplasm of ascending colon CPT copyright 2022 American Medical Association. All rights reserved. The codes documented in this report are preliminary and upon coder review may  be revised to meet current compliance requirements. Ruel Kung,  MD Ruel Kung MD, MD 10/16/2024 11:35:01 AM This report has been signed electronically. Number of Addenda: 0 Note Initiated On: 10/16/2024 11:09 AM Scope Withdrawal Time: 0 hours 9 minutes 54 seconds  Total Procedure Duration: 0 hours 12 minutes 8 seconds  Estimated Blood Loss:  Estimated blood loss: none.      [facility]

## 2024-10-16 NOTE — H&P (Signed)
 "  Ruel Kung , MD [address], [address], [place], [place], [address] Phone: [phone] Fax: [phone]  Primary Care Physician:  Vicci Duwaine SQUIBB, DO   Pre-Procedure History & Physical: HPI:  Debra Cannon is a 69 y.o. female is here for an colonoscopy.   Past Medical History:  Diagnosis Date   Anxiety    sometimes last 6 months most recently   Depression    1 1/2 years ago most recent   Herpes    Hypercholesteremia    Hypertension    Menopause    Migraine     Past Surgical History:  Procedure Laterality Date   APPENDECTOMY     BREAST EXCISIONAL BIOPSY Right 2019   lipoma   BREAST SURGERY     COLONOSCOPY  09/03/2008   Dr Viktoria   COLONOSCOPY WITH PROPOFOL  N/A 06/23/2019   Procedure: COLONOSCOPY WITH PROPOFOL ;  Surgeon: Kung Ruel, MD;  Location: [facility] ENDOSCOPY;  Service: Gastroenterology;  Laterality: N/A;   DIAGNOSTIC LAPAROSCOPY     FRACTURE SURGERY     laporoscopy     REPAIR ANKLE LIGAMENT Left 2004 & 2005   UPPER GI ENDOSCOPY  2009   Dr Viktoria    Prior to Admission medications  Medication Sig Start Date End Date Taking? Authorizing Provider  B Complex Vitamins (VITAMIN B COMPLEX) TABS Take 1 tablet by mouth daily. 05/04/17  Yes [provider]  citalopram  (CELEXA ) 20 MG tablet Take 1 tablet (20 mg total) by mouth daily. To be taken with the 40mg  for 60mg  total 08/01/24  Yes Johnson, Megan P, DO  fluticasone  (FLONASE ) 50 MCG/ACT nasal spray Spray 2 sprays into each nostril once daily. 06/10/23  Yes   ipratropium (ATROVENT ) 0.06 % nasal spray Place 2 sprays into both nostrils 4 (four) times daily. 09/12/24  Yes Van Knee, MD  LORazepam  (ATIVAN ) 0.5 MG tablet Take 1 tablet (0.5 mg total) by mouth 2 (two) times daily as needed for anxiety. 08/01/24  Yes Johnson, Megan P, DO  Na Sulfate-K Sulfate-Mg Sulfate concentrate (SUPREP) 17.5-3.13-1.6 GM/177ML SOLN Take 1 Bottle by mouth as directed One kit contains 2 bottles.  Take both bottles at the  times instructed by your provider. 09/14/24  Yes   omeprazole (PRILOSEC) 20 MG capsule Take 20 mg by mouth daily as needed.    Yes [provider]  acyclovir  (ZOVIRAX ) 400 MG tablet Take 1 tablet (400 mg total) by mouth 3 (three) times daily as needed. 08/01/24   Johnson, Megan P, DO  albuterol  (VENTOLIN  HFA) 108 (90 Base) MCG/ACT inhaler Inhale 2 puffs into the lungs every 4 (four) hours as needed for wheezing or shortness of breath. Dispense with aerochamber 09/12/24   Van Knee, MD  amoxicillin -clavulanate (AUGMENTIN ) 875-125 MG tablet Take 1 tablet by mouth 2 (two) times daily. Patient not taking: Reported on 10/16/2024 08/30/24   Vicci Duwaine P, DO  benzonatate  (TESSALON ) 100 MG capsule Take 1 capsule (100 mg total) by mouth 3 (three) times daily as needed. 08/17/24     Cholecalciferol (VITAMIN D  PO) Take 2,000 Units by mouth daily.     [provider]  citalopram  (CELEXA ) 40 MG tablet Take 1 tablet (40 mg total) by mouth daily. To be taken with the 20mg  for 60mg  total 08/01/24 08/01/25  Vicci Duwaine P, DO  meloxicam  (MOBIC ) 7.5 MG tablet Take 1 tablet (7.5 mg total) by mouth 2 (two) times daily with a meal. Patient taking differently: Take 7.5 mg by mouth 2 (two)  times daily with a meal. PRN 06/16/24   Krasinski, Kevin, MD  ondansetron  (ZOFRAN -ODT) 4 MG disintegrating tablet Take 1 tablet (4 mg total) by mouth every 8 (eight) hours as needed. 01/21/23   Vivienne Delon HERO, PA-C  promethazine -dextromethorphan (PROMETHAZINE -DM) 6.25-15 MG/5ML syrup Take 5 mLs by mouth 4 (four) times daily as needed for cough. 09/12/24   Van Knee, MD  Spacer/Aero-Holding Chambers (AEROCHAMBER MV) inhaler Use as instructed 09/12/24   Van Knee, MD  SUMAtriptan  (IMITREX ) 100 MG tablet TAKE 1 TABLET BY MOUTH AS NEEDED. MAY REPEAT IN 2 HOURS IF HEADACHE PERSISTS OR RECURS. 04/18/24 04/18/25  Vicci Bouchard P, DO    Allergies as of 09/14/2024   (No Known Allergies)     Family History  Problem Relation Age of Onset   Osteoporosis Mother    Thyroid disease Mother    Heart disease Father    Heart attack Father 67   Cancer Sister        endometrial   Cardiomyopathy Brother    Cancer Maternal Grandmother        vulvar   Diabetes Maternal Grandmother    AAA (abdominal aortic aneurysm) Maternal Grandfather    Stroke Paternal Grandmother    Heart disease Paternal Grandfather    Breast cancer Neg Hx     Social History   Socioeconomic History   Marital status: Married    Spouse name: Oneil   Number of children: 0   Years of education: Not on file   Highest education level: Bachelor's degree (e.g., BA, AB, BS)  Occupational History   Occupation: retired  Tobacco Use   Smoking status: Former    Current packs/day: 0.00    Average packs/day: 1 pack/day for 20.0 years (20.0 ttl pk-yrs)    Types: Cigarettes    Start date: 26    Quit date: 2000    Years since quitting: 26.0   Smokeless tobacco: Never  Vaping Use   Vaping status: Never Used  Substance and Sexual Activity   Alcohol use: Not Currently    Comment: 2 drinks per year   Drug use: No   Sexual activity: Yes    Birth control/protection: None  Other Topics Concern   Not on file  Social History Narrative   07/11/24 has 2 step-children and 5 grandchildren/pbt   Social Drivers of Health   Tobacco Use: Medium Risk (10/16/2024)   Patient History    Smoking Tobacco Use: Former    Smokeless Tobacco Use: Never    Passive Exposure: Not on Actuary Strain: Low Risk  (08/17/2024)   Received from [facility]   Overall Financial Resource Strain (CARDIA)    Difficulty of Paying Living Expenses: Not hard at all  Food Insecurity: No Food Insecurity (08/17/2024)   Received from [facility]   Epic    Within the past 12 months, you worried that your food would run out before you got the money to buy more.: Never true    Within the past 12  months, the food you bought just didn't last and you didn't have money to get more.: Never true  Transportation Needs: No Transportation Needs (08/17/2024)   Received from [facility] - Transportation    In the past 12 months, has lack of transportation kept you from medical appointments or from getting medications?: No    Lack of Transportation (Non-Medical): No  Physical Activity: Sufficiently Active (08/01/2024)   Exercise Vital  Sign    Days of Exercise per Week: 6 days    Minutes of Exercise per Session: 50 min  Stress: No Stress Concern Present (08/01/2024)   [employer] of [employer] - Occupational Stress Questionnaire    Feeling of Stress: Only a little  Social Connections: Moderately Integrated (08/01/2024)   Social Connection and Isolation Panel    Frequency of Communication with Friends and Family: More than three times a week    Frequency of Social Gatherings with Friends and Family: Three times a week    Attends Religious Services: 1 to 4 times per year    Active Member of Clubs or Organizations: No    Attends Engineer, Structural: Not on file    Marital Status: Married  Recent Concern: Social Connections - Moderately Isolated (07/11/2024)   Social Connection and Isolation Panel    Frequency of Communication with Friends and Family: More than three times a week    Frequency of Social Gatherings with Friends and Family: More than three times a week    Attends Religious Services: Never    Database Administrator or Organizations: No    Attends Banker Meetings: Never    Marital Status: Married  Catering Manager Violence: Not At Risk (07/11/2024)   Epic    Fear of Current or Ex-Partner: No    Emotionally Abused: No    Physically Abused: No    Sexually Abused: No  Depression (PHQ2-9): Low Risk (07/11/2024)   Depression (PHQ2-9)    PHQ-2 Score: 0  Alcohol Screen: Low Risk (07/11/2024)   Alcohol Screen    Last  Alcohol Screening Score (AUDIT): 0  Housing: Low Risk  (08/17/2024)   Received from [facility]   Epic    In the last 12 months, was there a time when you were not able to pay the mortgage or rent on time?: No    In the past 12 months, how many times have you moved where you were living?: 0    At any time in the past 12 months, were you homeless or living in a shelter (including now)?: No  Utilities: Not At Risk (08/17/2024)   Received from [facility]   Epic    In the past 12 months has the electric, gas, oil, or water company threatened to shut off services in your home?: No  Health Literacy: Adequate Health Literacy (07/11/2024)   B1300 Health Literacy    Frequency of need for help with medical instructions: Never    Review of Systems: See HPI, otherwise negative ROS  Physical Exam: BP 128/87   Pulse 65   Temp (!) 96.9 F (36.1 C) (Temporal)   Resp 18   Ht 5' 8 (1.727 m)   Wt 82 kg   LMP  (LMP Unknown)   SpO2 100%   BMI 27.49 kg/m  General:   Alert,  pleasant and cooperative in NAD Head:  Normocephalic and atraumatic. Neck:  Supple; no masses or thyromegaly. Lungs:  Clear throughout to auscultation, normal respiratory effort.    Heart:  +S1, +S2, Regular rate and rhythm, No edema. Abdomen:  Soft, nontender and nondistended. Normal bowel sounds, without guarding, and without rebound.   Neurologic:  Alert and  oriented x4;  grossly normal neurologically.  Impression/Plan: Debra Cannon is here for an colonoscopy to be performed for surveillance due to prior history of colon polyps   Risks, benefits, limitations, and alternatives regarding  colonoscopy  have been reviewed with the patient.  Questions have been answered.  All parties agreeable.   Ruel Kung, MD  10/16/2024, 10:36 AM  "

## 2024-10-16 NOTE — Anesthesia Postprocedure Evaluation (Signed)
"   Anesthesia Post Note  Patient: Debra Cannon  Procedure(s) Performed: COLONOSCOPY POLYPECTOMY, INTESTINE  Patient location during evaluation: PACU Anesthesia Type: General Level of consciousness: awake and alert Pain management: pain level controlled Vital Signs Assessment: post-procedure vital signs reviewed and stable Respiratory status: spontaneous breathing, nonlabored ventilation, respiratory function stable and patient connected to nasal cannula oxygen Cardiovascular status: blood pressure returned to baseline and stable Postop Assessment: no apparent nausea or vomiting Anesthetic complications: no   No notable events documented.   Last Vitals:  Vitals:   10/16/24 1020 10/16/24 1138  BP: 128/87 112/62  Pulse: 65 64  Resp: 18 15  Temp: (!) 36.1 C 36.8 C  SpO2: 100% 97%    Last Pain:  Vitals:   10/16/24 1138  TempSrc: Temporal  PainSc: 0-No pain                 Fairy A Tiffine Henigan      "

## 2024-10-16 NOTE — Transfer of Care (Signed)
 Immediate Anesthesia Transfer of Care Note  Patient: Debra Cannon  Procedure(s) Performed: COLONOSCOPY POLYPECTOMY, INTESTINE  Patient Location: Endoscopy Unit  Anesthesia Type:General  Level of Consciousness: awake, alert , and oriented  Airway & Oxygen Therapy: Patient Spontanous Breathing  Post-op Assessment: Report given to RN and Post -op Vital signs reviewed and stable  Post vital signs: Reviewed and stable  Last Vitals:  Vitals Value Taken Time  BP 112/62 10/16/24 11:38  Temp    Pulse 63 10/16/24 11:39  Resp 19 10/16/24 11:39  SpO2 100 % 10/16/24 11:39  Vitals shown include unfiled device data.  Last Pain:  Vitals:   10/16/24 1020  TempSrc: Temporal  PainSc: 0-No pain         Complications: No notable events documented.

## 2024-10-16 NOTE — Anesthesia Preprocedure Evaluation (Signed)
"                                    Anesthesia Evaluation  Patient identified by MRN, date of birth, ID band Patient awake    Reviewed: Allergy & Precautions, H&P , NPO status , Patient's Chart, lab work & pertinent test results  Airway Mallampati: II  TM Distance: >3 FB Neck ROM: Full    Dental no notable dental hx.    Pulmonary neg pulmonary ROS, former smoker   Pulmonary exam normal breath sounds clear to auscultation       Cardiovascular hypertension, negative cardio ROS Normal cardiovascular exam Rhythm:Regular Rate:Normal     Neuro/Psych negative neurological ROS  negative psych ROS   GI/Hepatic negative GI ROS, Neg liver ROS,,,  Endo/Other  negative endocrine ROS    Renal/GU negative Renal ROS  negative genitourinary   Musculoskeletal negative musculoskeletal ROS (+)    Abdominal   Peds negative pediatric ROS (+)  Hematology negative hematology ROS (+)   Anesthesia Other Findings   Reproductive/Obstetrics negative OB ROS                              Anesthesia Physical Anesthesia Plan  ASA: 2  Anesthesia Plan: General   Post-op Pain Management:    Induction: Intravenous  PONV Risk Score and Plan:   Airway Management Planned:   Additional Equipment:   Intra-op Plan:   Post-operative Plan: Extubation in OR  Informed Consent: I have reviewed the patients History and Physical, chart, labs and discussed the procedure including the risks, benefits and alternatives for the proposed anesthesia with the patient or authorized representative who has indicated his/her understanding and acceptance.     Dental advisory given  Plan Discussed with: CRNA  Anesthesia Plan Comments:         Anesthesia Quick Evaluation  "

## 2024-10-17 LAB — SURGICAL PATHOLOGY

## 2024-10-18 ENCOUNTER — Ambulatory Visit: Payer: Self-pay | Admitting: Gastroenterology

## 2024-10-24 ENCOUNTER — Ambulatory Visit: Admitting: Internal Medicine

## 2024-10-27 ENCOUNTER — Encounter: Payer: Self-pay | Admitting: Family Medicine

## 2024-11-07 ENCOUNTER — Other Ambulatory Visit

## 2024-11-14 ENCOUNTER — Other Ambulatory Visit

## 2025-01-31 ENCOUNTER — Ambulatory Visit: Admitting: Family Medicine

## 2025-07-17 ENCOUNTER — Ambulatory Visit
